# Patient Record
Sex: Male | Born: 1945 | Race: White | Hispanic: No | Marital: Married | Smoking: Never smoker
Health system: Southern US, Community
[De-identification: ages and names within clinical notes are randomized; demographics above are authoritative.]

## PROBLEM LIST (undated history)

## (undated) DIAGNOSIS — G500 Trigeminal neuralgia: Secondary | ICD-10-CM

## (undated) DIAGNOSIS — B029 Zoster without complications: Secondary | ICD-10-CM

## (undated) DIAGNOSIS — Z860101 Personal history of adenomatous and serrated colon polyps: Secondary | ICD-10-CM

## (undated) DIAGNOSIS — I639 Cerebral infarction, unspecified: Secondary | ICD-10-CM

## (undated) DIAGNOSIS — I10 Essential (primary) hypertension: Secondary | ICD-10-CM

## (undated) DIAGNOSIS — G7000 Myasthenia gravis without (acute) exacerbation: Secondary | ICD-10-CM

## (undated) DIAGNOSIS — E785 Hyperlipidemia, unspecified: Secondary | ICD-10-CM

## (undated) DIAGNOSIS — Z86010 Personal history of colonic polyps: Secondary | ICD-10-CM

## (undated) DIAGNOSIS — K2270 Barrett's esophagus without dysplasia: Secondary | ICD-10-CM

## (undated) DIAGNOSIS — K219 Gastro-esophageal reflux disease without esophagitis: Secondary | ICD-10-CM

## (undated) DIAGNOSIS — G4730 Sleep apnea, unspecified: Secondary | ICD-10-CM

## (undated) HISTORY — DX: Trigeminal neuralgia: G50.0

## (undated) HISTORY — DX: Barrett's esophagus without dysplasia: K22.70

## (undated) HISTORY — DX: Sleep apnea, unspecified: G47.30

## (undated) HISTORY — PX: COLONOSCOPY: SHX174

## (undated) HISTORY — DX: Zoster without complications: B02.9

## (undated) HISTORY — DX: Personal history of adenomatous and serrated colon polyps: Z86.0101

## (undated) HISTORY — DX: Gastro-esophageal reflux disease without esophagitis: K21.9

## (undated) HISTORY — DX: Essential (primary) hypertension: I10

## (undated) HISTORY — DX: Myasthenia gravis without (acute) exacerbation: G70.00

## (undated) HISTORY — DX: Hyperlipidemia, unspecified: E78.5

## (undated) HISTORY — DX: Personal history of colonic polyps: Z86.010

## (undated) HISTORY — PX: TONSILLECTOMY: SUR1361

---

## 2001-05-02 ENCOUNTER — Ambulatory Visit (HOSPITAL_COMMUNITY): Admission: RE | Admit: 2001-05-02 | Discharge: 2001-05-02 | Payer: Self-pay | Admitting: Internal Medicine

## 2001-05-10 ENCOUNTER — Encounter (INDEPENDENT_AMBULATORY_CARE_PROVIDER_SITE_OTHER): Payer: Self-pay | Admitting: *Deleted

## 2001-05-10 ENCOUNTER — Ambulatory Visit (HOSPITAL_BASED_OUTPATIENT_CLINIC_OR_DEPARTMENT_OTHER): Admission: RE | Admit: 2001-05-10 | Discharge: 2001-05-10 | Payer: Self-pay | Admitting: Orthopedic Surgery

## 2002-08-24 ENCOUNTER — Ambulatory Visit (HOSPITAL_BASED_OUTPATIENT_CLINIC_OR_DEPARTMENT_OTHER): Admission: RE | Admit: 2002-08-24 | Discharge: 2002-08-24 | Payer: Self-pay | Admitting: Family Medicine

## 2003-04-17 ENCOUNTER — Emergency Department (HOSPITAL_COMMUNITY): Admission: EM | Admit: 2003-04-17 | Discharge: 2003-04-17 | Payer: Self-pay | Admitting: *Deleted

## 2003-04-17 ENCOUNTER — Encounter: Payer: Self-pay | Admitting: *Deleted

## 2003-07-01 ENCOUNTER — Ambulatory Visit (HOSPITAL_COMMUNITY): Admission: RE | Admit: 2003-07-01 | Discharge: 2003-07-01 | Payer: Self-pay | Admitting: Internal Medicine

## 2003-07-01 HISTORY — PX: ESOPHAGOGASTRODUODENOSCOPY: SHX1529

## 2003-07-11 ENCOUNTER — Ambulatory Visit (HOSPITAL_COMMUNITY): Admission: RE | Admit: 2003-07-11 | Discharge: 2003-07-11 | Payer: Self-pay | Admitting: Family Medicine

## 2004-12-09 ENCOUNTER — Ambulatory Visit: Payer: Self-pay | Admitting: Internal Medicine

## 2004-12-09 ENCOUNTER — Ambulatory Visit (HOSPITAL_COMMUNITY): Admission: RE | Admit: 2004-12-09 | Discharge: 2004-12-09 | Payer: Self-pay | Admitting: Internal Medicine

## 2004-12-09 HISTORY — PX: ESOPHAGOGASTRODUODENOSCOPY: SHX1529

## 2004-12-09 HISTORY — PX: COLONOSCOPY: SHX174

## 2007-05-05 ENCOUNTER — Ambulatory Visit (HOSPITAL_COMMUNITY): Admission: RE | Admit: 2007-05-05 | Discharge: 2007-05-05 | Payer: Self-pay | Admitting: Neurology

## 2008-05-01 ENCOUNTER — Ambulatory Visit: Payer: Self-pay | Admitting: Internal Medicine

## 2008-05-20 ENCOUNTER — Ambulatory Visit (HOSPITAL_COMMUNITY): Admission: RE | Admit: 2008-05-20 | Discharge: 2008-05-20 | Payer: Self-pay | Admitting: Internal Medicine

## 2008-05-20 ENCOUNTER — Encounter: Payer: Self-pay | Admitting: Internal Medicine

## 2008-05-20 ENCOUNTER — Ambulatory Visit: Payer: Self-pay | Admitting: Internal Medicine

## 2008-05-20 HISTORY — PX: COLONOSCOPY: SHX174

## 2008-05-20 HISTORY — PX: ESOPHAGOGASTRODUODENOSCOPY: SHX1529

## 2008-09-04 ENCOUNTER — Ambulatory Visit (HOSPITAL_COMMUNITY): Admission: RE | Admit: 2008-09-04 | Discharge: 2008-09-04 | Payer: Self-pay | Admitting: Ophthalmology

## 2008-10-14 ENCOUNTER — Encounter: Payer: Self-pay | Admitting: Cardiology

## 2008-10-21 ENCOUNTER — Ambulatory Visit: Payer: Self-pay | Admitting: Cardiology

## 2008-10-21 ENCOUNTER — Encounter: Payer: Self-pay | Admitting: Physician Assistant

## 2008-10-22 ENCOUNTER — Ambulatory Visit: Payer: Self-pay | Admitting: Cardiology

## 2008-10-22 ENCOUNTER — Ambulatory Visit (HOSPITAL_COMMUNITY): Admission: RE | Admit: 2008-10-22 | Discharge: 2008-10-22 | Payer: Self-pay | Admitting: Cardiology

## 2008-10-22 ENCOUNTER — Encounter: Payer: Self-pay | Admitting: Cardiology

## 2008-11-15 ENCOUNTER — Ambulatory Visit: Payer: Self-pay | Admitting: Cardiology

## 2008-12-20 ENCOUNTER — Ambulatory Visit: Payer: Self-pay | Admitting: Cardiology

## 2008-12-25 ENCOUNTER — Ambulatory Visit: Payer: Self-pay | Admitting: Cardiology

## 2008-12-25 ENCOUNTER — Encounter: Payer: Self-pay | Admitting: Cardiology

## 2008-12-25 DIAGNOSIS — R002 Palpitations: Secondary | ICD-10-CM | POA: Insufficient documentation

## 2008-12-25 DIAGNOSIS — F411 Generalized anxiety disorder: Secondary | ICD-10-CM | POA: Insufficient documentation

## 2008-12-25 DIAGNOSIS — D126 Benign neoplasm of colon, unspecified: Secondary | ICD-10-CM | POA: Insufficient documentation

## 2008-12-25 DIAGNOSIS — I10 Essential (primary) hypertension: Secondary | ICD-10-CM | POA: Insufficient documentation

## 2008-12-25 DIAGNOSIS — G4733 Obstructive sleep apnea (adult) (pediatric): Secondary | ICD-10-CM | POA: Insufficient documentation

## 2008-12-25 DIAGNOSIS — R079 Chest pain, unspecified: Secondary | ICD-10-CM | POA: Insufficient documentation

## 2008-12-25 DIAGNOSIS — M109 Gout, unspecified: Secondary | ICD-10-CM | POA: Insufficient documentation

## 2008-12-25 DIAGNOSIS — K219 Gastro-esophageal reflux disease without esophagitis: Secondary | ICD-10-CM | POA: Insufficient documentation

## 2008-12-25 DIAGNOSIS — K2270 Barrett's esophagus without dysplasia: Secondary | ICD-10-CM | POA: Insufficient documentation

## 2008-12-25 DIAGNOSIS — E782 Mixed hyperlipidemia: Secondary | ICD-10-CM | POA: Insufficient documentation

## 2009-02-04 ENCOUNTER — Telehealth (INDEPENDENT_AMBULATORY_CARE_PROVIDER_SITE_OTHER): Payer: Self-pay | Admitting: *Deleted

## 2010-08-09 ENCOUNTER — Encounter: Payer: Self-pay | Admitting: Ophthalmology

## 2010-12-01 NOTE — H&P (Signed)
NAMEKIPTON, SKILLEN              ACCOUNT NO.:  [id number]   MEDICAL RECORD NO.:  [id number]          PATIENT TYPE:  AMB   LOCATION:  DAY                           FACILITY:  [facility]   PHYSICIAN:  R. Roetta Sessions, M.D. DATE OF BIRTH:  1946-05-24   DATE OF ADMISSION:  DATE OF DISCHARGE:  LH                              HISTORY & PHYSICAL   REFERRING PHYSICIAN:  Donna Bernard, MD   REASON FOR CONSULTATION:  Due for surveillance of Barrett and  surveillance of adenomatous colon polyps.   HISTORY OF PRESENT ILLNESS:  Mr. Mosqueda is a 65 year old Caucasian male.  He has a history of Barrett esophagus.  Last EGD was Dec 10, 2004.  He  was found to have salmon-colored epithelium beginning at 33 cm  consistent with previous diagnosis of Barrett's esophagus which was  segmentally biopsied and consistent with benign glandular epithelium  with an intestinal metaplasia which was consistent with 2004 exam.  He  does have a hiatal hernia.  He underwent colonoscopy at the same time.  He had a diminutive polyp removed at 35 cm which was found an inflamed  focally adenomatous polyp.  He is here for surveillance.  He denies any  GI problems at this time.  He does have chronic GERD which is well  controlled on Nexium 40 mg daily.  There is a positive family history of  colon cancer in his mother in her late 54s.  He denies any abdominal  pain.  Denies rectal bleeding or melena.  Denies any constipation or  diarrhea.  Denies any heartburn, indigestion, dysphagia, odynophagia,  anorexia, or early satiety.   PAST MEDICAL AND SURGICAL HISTORY:  1. History of Barrett esophagus.  As described above, history of      chronic GERD.  2. History of adenomatous colon polyp as described in HPI.  3. Hypertension.  4. Hypercholesterolemia.  5. GERD.  6. Gout.  7. Bilateral great toe surgeries.   CURRENT MEDICATIONS:  1. Nexium 40 mg daily.  2. Lisinopril 5 mg daily.  3. Pravastatin 80 mg daily.  4.  Allopurinol 100 mg daily.  5. Aspirin 81 mg daily.   ALLERGIES:  No known drug allergies.   FAMILY HISTORY:  Positive as described in the HPI in mother.  Father  deceased secondary to MI.  Sister is healthy and 4 brothers with history  significant for prostate carcinoma.   SOCIAL HISTORY:  Mr. Hoogendoorn is divorced.  He lives alone.  He has 2  children, a son and a daughter.  He is retired from [employer].  He denies any tobacco or drug use.  He consumes  about 4 beers per month.   REVIEW OF SYSTEMS:  See HPI, otherwise negative physical.   PHYSICAL EXAMINATION:  VITAL SIGNS:  Weight 221 pounds, 65 inches, temp  98 degrees, blood pressure 132/84, and pulse 54.  GENERAL:  He is an obese Caucasian male who is alert, oriented,  pleasant, cooperative in no acute distress.  HEENT:  Sclerae clear, nonicteric. Conjunctivae pink. Oropharynx pink  and moist without any lesions.  NECK:  Supple without mass or thyromegaly.  CHEST:  Heart regular rate and rhythm.  Normal S1 and S2.  No murmurs,  clicks, rubs, or gallops.  LUNGS:  Clear to auscultation bilaterally.  ABDOMEN:  Positive bowel sounds x4.  No bruits auscultated.  Soft,  nontender, nondistended without palpable mass or hepatosplenomegaly.  No  rigidity or guarding.  EXTREMITIES:  Without clubbing or edema.   IMPRESSION:  Mr. Catterton is a 65 year old Caucasian male who is due for  surveillance of both his Barrett esophagus as well as his history of  adenomatous colon polyp.   PLAN:  1. Surveillance EGD with random biopsies and colonoscopy with Dr.      Jena Gauss in the near future.  I discussed both procedures including      risks and benefits, which include but are not limited to bleeding,      infection, perforation, drug reaction.  He agrees with this plan      and consent will be obtained.  2. He is to continue Nexium 40 mg daily.      Lorenza Burton, N.P.      Jonathon Bellows, M.D.   Electronically Signed    KJ/MEDQ  D:  05/01/2008  T:  05/02/2008  Job:  [id number]

## 2010-12-01 NOTE — Op Note (Signed)
Bradley [name], Bradley [name]              ACCOUNT NO.:  [id number]   MEDICAL RECORD NO.:  [id number]          PATIENT TYPE:  AMB   LOCATION:  DAY                           FACILITY:  [facility]   PHYSICIAN:  R. Roetta Sessions, M.D. DATE OF BIRTH:  [birth year]   DATE OF PROCEDURE:  DATE OF DISCHARGE:  05/20/2008                               OPERATIVE REPORT   PROCEDURE:  Esophagogastroduodenoscopy with esophageal biopsy followed  by colonoscopy snare polypectomy.   INDICATIONS FOR PROCEDURE:  A 65 year old gentleman with known Barrett's  esophagus, family history of colonic adenomatous polyps.  EGD and  colonoscopy is now being done.  Risks, benefits, alternatives, and  limitations have been reviewed previously and again today at bedside.  Questions answered.  Please see documentation in medical record.   PROCEDURE NOTE:  O2 saturation, blood pressure, pulse, and respirations  monitored throughout the entire of the above procedure.  Conscious  sedation, Versed 5 mg IV and Demerol 75 mg IV in divided doses.   INSTRUMENT:  Pentax video chip system.   FINDINGS:  Examination of tubular esophagus revealed salmon-colored  epithelium coming up to approximately 31 cm from the incisors.  EG  junction was turned the approximate 37 cm from the incisors.  There was  no evidence of neoplasm or esophagitis.  EG junction easily traversed.  Stomach:  Gas cavity was emptied and insufflated well with air.  Thorough examination of the gastric mucosa including retroflexion at the  proximal stomach and esophagogastric junction demonstrated only a small  hiatal hernia.  Pylorus was patent and easily traversed.  Examination of  the bulb and second portion revealed no abnormalities.  Therapeutic/diagnostic maneuvers performed:  The biopsies of proximal  mid distal Barrett's taken for histologic study.  The patient tolerated  the procedure well and was prepared for colonoscopy.  Digital rectal  exam revealed no  abnormalities, and the scope was placed.  The prep was  adequate.  Colon:  Colonic mucosa was surveyed from the rectosigmoid  junction through the left transverse, right colon to the appendiceal  orifice, ileocecal valve, and cecum.  These structures were well seen  and photographed for the record.  From this level, the scope was slowly  withdrawn.  All previously mentioned mucosal surfaces were again seen.  The patient had a 6-mm polyp on a stalk mid sigmoid which was cold  snared.  The remainder of the colonic mucosa appeared normal.  The scope  was pulled down to the rectum with examination of rectal mucosa,  including retroflexed view of the anal verge demonstrated no  abnormalities.  The patient tolerated the procedure well and was reacted  to endoscopy.   IMPRESSION:  1. EGD, salmon-colored epithelium distal esophagus consistent with      Barrett's esophagus.  No significant change from prior exam, status      post segmental biopsy, hiatal hernia, otherwise normal stomach, D1      and D2.  2. Colonoscopy findings, normal rectum.  3. Polyp midsigmoid status post snare removal.  Remainder of the      colonic mucosa appeared normal.  RECOMMENDATIONS:  1. We will follow path.  2. Continue Nexium 40 mg orally daily indefinitely.  3. Further recommendations to follow.   Addendum 08/30/2008:  Patient has a personal history of colonic adenomatous polyps and need  for surveillance colonoscopy was indicated.      Jonathon Bellows, M.D.  Electronically Signed     RMR/MEDQ  D:  05/20/2008  T:  08/30/2008  Job:  [id number]

## 2010-12-01 NOTE — Letter (Signed)
November 15, 2008    Bradley A. Gerda Diss, MD  [address]  [place], [place] [address]   RE:  Bradley [name], Bradley [name]  MRN:  [id number]  /  DOB:  Sep 11, 1945   Dear Bradley [name]:   Bradley [name] returns to the office for continued assessment and treatment  of chest discomfort.  He continues to have some chest pressure  intermittently that is unrelated with exertion and resolve  spontaneously.  He also has some sharp chest pain that is momentary.  Overall, he feels that this symptom is substantially better.   Currently, he is also complaining of palpitations.  His wife is  concerned that he has a supraventricular tachycardia.  At times, heart  rate  is up to 120 recorded by his blood pressure device.  He typically  appears fatigued during these spells.  At other times, his heart rate  drops to 70 with an improved sense of well being.   Medications are unchanged from his last visit.   On exam, pleasant gentleman in no acute distress.  The weight is 219, stable.  Blood pressure 125/75, heart rate 105 and  regular, respirations 12 and unlabored.  NECK:  No jugular venous distention; no carotid bruits.  LUNGS:  Clear.  CARDIAC:  Normal first and second heart sounds; fourth heart sound  present.  ABDOMEN:  Soft and nontender; no bruits; no masses; no organomegaly.  EXTREMITIES:  No edema; normal distal pulses.   IMPRESSION:  Bradley [name] had a negative and adequate stress test.  His  symptoms are atypical.  I doubt that he has coronary artery disease or  coronary artery spasm.   His subjective and objective tachycardias and episodic chest pressure  are certainly consistent with anxiety.  He agrees that he worries a lot  and has considerable stress in his life.  He has improved with use of  benzodiazepines intermittently in the past.  This might be an avenue to  consider as well as perhaps some psychotherapy.   For now, we will proceed with event recording to document that he does  not have  tachycardia.  I explained that SVT is in the range of 110 - 120  beats per minute are unusual, but certainly not unheard of.  I will see  this nice gentleman in back in 1 month after his 3 weeks of event  recording has been completed.    Sincerely,      Gerrit Friends. Dietrich Pates, MD, [facility]  Electronically Signed    RMR/MedQ  DD: 11/15/2008  DT: 11/16/2008  Job #: [id number]

## 2010-12-01 NOTE — Assessment & Plan Note (Signed)
[facility]                       [place] CARDIOLOGY OFFICE NOTE   NAME:[name] [name] GEFFRE                     MRN:          [id number]  DATE:10/21/2008                            DOB:          Nov 20, 1945    REFERRING PHYSICIAN:  Scott A. Gerda Diss, MD   REASON FOR REFERRAL:  Chest pain.   HISTORY OF PRESENT ILLNESS:  [name] [name] is a 65 year old male patient  with a history of hypertension, hyperlipidemia, and family history of  CAD who was referred by Dr. Gerda Diss for further evaluation of chest  discomfort.  The patient notes a history of exertional shortness of  breath and chest discomfort that he describes as a tightness/pressure  over the last 6 months that seems to be getting worse.  He also notes  some right arm pain associated with that.  He denies nausea,  diaphoresis, syncope or near-syncope.  He has also had some symptoms of  chest tightness at rest.  He says that this is clearly different from  the symptoms he experiences with exertion.  This can usually last for a  couple of minutes up to all day.  He also notes a history of chest  discomfort that occurred about 2-3 weeks ago when he laid down to go to  bed.  It was a sharp type pain that was substernal and felt like it  radiated through to his back.  He felt that his heart was racing with  this.  He was slightly short of breath.  He denied any associated  symptoms such as arm or jaw pain, nausea, diaphoresis.  His.  He says he  thought his heart rate was in the 120s.   PAST MEDICAL HISTORY:  As outlined above.  In addition, he had a stress  test some years ago that was apparently negative.  The records are  unavailable at this time.  He has a history of gout, as well as GERD and  sleep apnea.  He cannot tolerate CPAP.  He he does have a history of  Barrett's esophagus, as well as an adenomatous colon polyp and has been  evaluated by Dr. Jena Gauss in the past.   MEDICATIONS:  Nexium 40 mg  daily, pravastatin 80 mg daily, allopurinol  300 mg daily, lisinopril 5 mg daily, aspirin 325 mg p.r.n.   ALLERGIES:  NO KNOWN DRUG ALLERGIES.   SOCIAL HISTORY:  The patient lives in the [place].  He is a retired  Consulting civil engineer for the [employer]  He is  unmarried.  He has two children.  He does have a girlfriend.  He plays  in a band and is exposed to quite a bit of secondhand smoke.  He denies  any tobacco abuse himself and denies any alcohol abuse.   FAMILY HISTORY:  Significant for CAD.  His father died at age 85 of a  myocardial infarction.  His first MI was in his 19s.  He has a brother  who had a myocardial infarction in his 88s as well who is alive at this  time at age 71.   REVIEW OF SYSTEMS:  Please see HPI.  Denies fevers, chills, cough,  congestion, sore throat, melena, hematochezia, hematuria or dysuria.  All other systems reviewed and negative.   PHYSICAL EXAMINATION:  He is a well-nourished, well-developed male in no  acute distress.  Blood pressure is 122/78, pulse 88, weight 220 pounds.  HEENT:  Normal.  NECK:  Without JVD.  LYMPH:  No lymphadenopathy.  ENDOCRINE:  Without thyromegaly.  CARDIAC:  Normal S1, S2.  Regular rate and rhythm without murmur,  clicks, rubs or gallops.  LUNGS:  Clear to auscultation bilaterally without wheezing, rhonchi or  rales.  ABDOMEN:  Soft and tender with normoactive bowel sounds.  No  organomegaly.  EXTREMITIES:  Without edema.  SKIN:  Warm and dry.  NEUROLOGIC:  He is alert and oriented x3.  Cranial nerves II-XII are  grossly intact.  VASCULAR:  No carotid bruits noted bilaterally.  Femoral artery pulses  are difficult to palpate bilaterally but no bruits are auscultated.  Dorsalis pedis and posterior tibialis pulses are 2+ bilaterally.  Popliteal pulse are 2+ bilaterally.   Electrocardiogram demonstrates sinus rhythm with a heart rate of 88,  normal axis, poor R-wave progression, no ischemic  changes.   ASSESSMENT/PLAN:  [name] [name] is 65 year old male with hypertension,  treated hyperlipidemia and a family history of coronary artery disease  who presents for further evaluation of exertional chest pain and  shortness of breath.  Dr. Dietrich Pates has also interviewed and the patient.  After further review with Dr. Dietrich Pates, it is felt that the patient's  symptoms have some atypical features to them as well.  We plan to  further evaluate him with a routine exercise treadmill test tomorrow  with Dr. Dietrich Pates.  He will also be set up for a 2-D echocardiogram to  assess for wall motion abnormalities and to further assess his LV  function given his abnormal electrocardiogram.  He will be asked to take  an aspirin a day.  Nitroglycerin to be taken p.r.n. will be added to his  medical regimen.  We will see him back in follow-up tomorrow for an  office visit after his stress test is completed.      Tereso Newcomer, PA-C  Electronically Signed      Gerrit Friends. Dietrich Pates, MD, [facility]  Electronically Signed   SW/MedQ  DD: 10/21/2008  DT: 10/21/2008  Job #: [id number]   cc:   Lorin Picket A. Gerda Diss, MD

## 2010-12-01 NOTE — Op Note (Signed)
Bradley [name], Bradley [name]              ACCOUNT NO.:  [id number]   MEDICAL RECORD NO.:  [id number]          PATIENT TYPE:  AMB   LOCATION:  DAY                           FACILITY:  [facility]   PHYSICIAN:  R. Roetta Sessions, M.D. DATE OF BIRTH:  09-03-1945   DATE OF PROCEDURE:  DATE OF DISCHARGE:                               OPERATIVE REPORT   PROCEDURE:  Esophagogastroduodenoscopy with esophageal biopsy followed  by colonoscopy snare polypectomy.   INDICATIONS FOR PROCEDURE:  A 65 year old gentleman with known Barrett's  esophagus, family history of colonic adenomatous polyps.  EGD and  colonoscopy is now being done.  Risks, benefits, alternatives, and  limitations have been reviewed previously and again today at bedside.  Questions answered.  Please see documentation in medical record.   PROCEDURE NOTE:  O2 saturation, blood pressure, pulse, and respirations  monitored throughout the entire of the above procedure.  Conscious  sedation, Versed 5 mg IV and Demerol 75 mg IV in divided doses.   INSTRUMENT:  Pentax video chip system.   FINDINGS:  Examination of tubular esophagus revealed salmon-colored  epithelium coming up to approximately 31 cm from the incisors.  EG  junction was turned the approximate 37 cm from the incisors.  There was  no evidence of neoplasm or esophagitis.  EG junction easily traversed.  Stomach:  Gas cavity was emptied and insufflated well with air.  Thorough examination of the gastric mucosa including retroflexion at the  proximal stomach and esophagogastric junction demonstrated only a small  hiatal hernia.  Pylorus was patent and easily traversed.  Examination of  the bulb and second portion revealed no abnormalities.  Therapeutic/diagnostic maneuvers performed:  The biopsies of proximal  mid distal Barrett's taken for histologic study.  The patient tolerated  the procedure well and was prepared for colonoscopy.  Digital rectal  exam revealed no abnormalities, and  the scope was placed.  The prep was  adequate.  Colon:  Colonic mucosa was surveyed from the rectosigmoid  junction through the left transverse, right colon to the appendiceal  orifice, ileocecal valve, and cecum.  These structures were well seen  and photographed for the record.  From this level, the scope was slowly  withdrawn.  All previously mentioned mucosal surfaces were again seen.  The patient had a 6-mm polyp on a stalk mid sigmoid which was cold  snared.  The remainder of the colonic mucosa appeared normal.  The scope  was pulled down to the rectum with examination of rectal mucosa,  including retroflexed view of the anal verge demonstrated no  abnormalities.  The patient tolerated the procedure well and was reacted  to endoscopy.   IMPRESSION:  1. EGD, salmon-colored epithelium distal esophagus consistent with      Barrett's esophagus.  No significant change from prior exam, status      post segmental biopsy, hiatal hernia, otherwise normal stomach, D1      and D2.  2. Colonoscopy findings, normal rectum.  3. Polyp midsigmoid status post snare removal.  Remainder of the      colonic mucosa appeared normal.   RECOMMENDATIONS:  1. We will follow path.  2. Continue Nexium 40 mg orally daily indefinitely.  3. Further recommendations to follow.      Jonathon Bellows, M.D.  Electronically Signed     RMR/MEDQ  D:  05/20/2008  T:  05/20/2008  Job:  [id number]   cc:   Lorin Picket A. Gerda Diss, MD  Fax: [phone]

## 2010-12-01 NOTE — Assessment & Plan Note (Signed)
[facility]                       [place] CARDIOLOGY OFFICE NOTE   NAME:Bradley [name], Bradley [name]                     MRN:          [id number]  DATE:10/22/2008                            DOB:          1945/09/26    PRIMARY CARE PHYSICIAN:  Scott A. Gerda Diss, MD   REASON FOR VISIT:  Post stress test followup.   HISTORY OF PRESENT ILLNESS:  Bradley [name] is a 65 year old male patient  whom I saw yesterday in consultation for chest pain.  I saw him in  conjunction with Dr. Dietrich Pates.  We set him up for exercise treadmill  test today to further evaluate his symptoms.  He was also set up for an  echocardiogram.  The stress test was read by Dr. Dietrich Pates.  I was  informed that his exercise treadmill test was normal without chest pain  or EKG changes.  Dr. Dietrich Pates also informed me that the patient's  echocardiogram was normal.  The patient was interviewed again today  regarding his chest pain.  He notes that he has not really had any  further symptoms in the last 4-5 days.  The most concerning symptom for  him was a couple of weeks ago when he laid down and had discomfort  substernally that radiated through to his back.  He notes that this is  not like his prior symptoms with acid reflux disease.  He notes that he  is able to exert himself at times without significant chest pain or  shortness of breath.  He denies any injuries to his chest.  He denies  any pleuritic symptoms.  He denies any syncope.  He denies any cough.   CURRENT MEDICATIONS:  Unchanged from yesterday.  He is now taking an  aspirin a day and has been given nitroglycerin p.r.n.   PHYSICAL EXAMINATION:  VITAL SIGNS:  Well-nourished well-developed male  in no acute distress.  Blood pressure at the beginning of the stress  test was 122/78, pulse 74.  HEENT:  Normal.  CARDIAC:  Normal S1 and S2.  Regular rate and rhythm.  LUNGS:  Clear to auscultation bilaterally.  ABDOMEN:  Soft, nontender.  EXTREMITIES:  Without edema.  SKIN:  Warm and dry.   ASSESSMENT AND PLAN:  1. Chest pain.  As noted above, the patient had a recent      echocardiogram as well as gated exercise treadmill test that are      both normal.  I discussed his case further with Dr. Dietrich Pates.      After further review, we have reassured the patient today regarding      his negative findings.  I offered to increase his Nexium to 40 mg      twice a day to see if this provided any benefit.  The patient does      not think his symptoms are related to acid reflux disease and would      like to hold off on this for now.  He has been instructed on how to      use his nitroglycerin.  If he has to use his nitroglycerin, he has  been instructed to contact us for earlier followup.  He has been      instructed on when he should go the emergency room.  He will      continue taking aspirin for now.  2. Hypertension, this is well controlled.  3. Dyslipidemia.  This is followed by Dr. Gerda Diss.  He continues on      pravastatin.   DISPOSITION:  The patient will be brought back in followup in 2 weeks to  see Dr. Dietrich Pates to reassess his symptoms and make further  recommendations, if any.      Tereso Newcomer, PA-C  Electronically Signed      Gerrit Friends. Dietrich Pates, MD, [facility]  Electronically Signed   SW/MedQ  DD: 10/22/2008  DT: 10/23/2008  Job #: [id number]   cc:   Lorin Picket A. Gerda Diss, MD

## 2010-12-04 NOTE — Op Note (Signed)
NAMEJACHAI, Bradley [name]                        ACCOUNT NO.:  [id number]   MEDICAL RECORD NO.:  [id number]                   PATIENT TYPE:  AMB   LOCATION:  DAY                                  FACILITY:  [facility]   PHYSICIAN:  R. Roetta Sessions, M.D.              DATE OF BIRTH:  11-21-1945   DATE OF PROCEDURE:  07/01/2003  DATE OF DISCHARGE:                                 OPERATIVE REPORT   PROCEDURE:  Esophagogastroduodenoscopy with biopsy.   INDICATIONS FOR PROCEDURE:  The patient is a 65 year old gentleman with long-  standing gastroesophageal reflux disease and biopsy proven Barrett's  esophagus.  Last surveillance EGD, June 2000.  He has done well with no  dysphagia or other GI symptoms.  He also had a colonoscopy for high risk  screening purposes at that time in 2000 which was negative and he is due for  surveillance colonoscopy in 2005.  He is here for surveillance EGD.  This  approach has been discussed with the patient at length.  The potential  risks, benefits, and alternatives have been reviewed and questions answered,  he is agreeable.  Please see my interim H&P for more information.   PROCEDURE NOTE:  O2 saturation, blood pressure, pulse, and respirations were  monitored throughout the entire procedure.   CONSCIOUS SEDATION:  Versed 3 mg IV and Demerol 75 mg IV in divided doses.   INSTRUMENT:  Olympus video adult gastroscope.   FINDINGS:  Examination of tubular esophagus which revealed color changes,  salmon colored epithelium beginning at 32 cm and extending completely down  to the EG junction.  There was no evidence of neoplasm or esophagitis -  please see photos.  The EG junction was patulous and easily traversed.   Stomach:  The gastric cavity was empty and insufflated well with air and a  thorough examination of the gastric mucosa, including a retroflex view of  the proximal stomach and esophagogastric junction demonstrated only a  moderate sized hiatal hernia.   The pylorus was patent and easily traversed.   Duodenum:  The duodenum and duodenal bulb, including the second portion  appeared normal.   THERAPEUTIC/DIAGNOSTIC MANEUVERS PERFORMED:  Biopsies at 40 cm (distal  esophagus), 35 cm and 32 cm from the incisors taken for histologic study.   The patient tolerated the procedure well and was reactive after endoscopy.   IMPRESSION:  1. Salmon colored epithelium involving essentially nearly the distal one-     half of the tubular esophagus, segmental biopsies taken.  Remainder of     esophageal mucosa appeared normal.  2. Patulous esophagogastric junction.  3. Moderate sized hiatal hernia.  4. Otherwise normal stomach, normal D1 and D2.   RECOMMENDATIONS:  1. Continue Nexium 40 mg orally daily.  2. Follow up on pathology.  3. The patient should return in 2005 for high risk screening colonoscopy.      ___________________________________________  Bradley [name], M.D.   RMR/MEDQ  D:  07/01/2003  T:  07/01/2003  Job:  [id number]   cc:   Lorin Picket A. Gerda Diss, M.D.  [address]  [place]  [place] [address]  Fax: [phone]

## 2010-12-04 NOTE — Op Note (Signed)
[facility]  Patient:    Bradley [name], Bradley [name] Visit Number: [id number] MRN: [id number]          Service Type: DSU Location: [facility] Attending Physician:  Ronne Binning Dictated by:   Nicki Reaper, M.D. Proc. Date: 05/10/01 Admit Date:  05/10/2001                             Operative Report  PREOPERATIVE DIAGNOSIS: Foreign body, left little finger.  POSTOPERATIVE DIAGNOSIS: Foreign body, left little finger.  OPERATION/PROCEDURE: Excision of foreign body of left little finger extensor tendon.  SURGEON: Nicki Reaper, M.D.  ASSISTANT: Joaquin Courts, R.N.  ANESTHESIA: Forearm-based IV regional.  ANESTHESIOLOGIST: Guadalupe Maple, M.D.  INDICATIONS FOR PROCEDURE: The patient is a 65 year old male, who suffered an injury with a piece of wood to his left little finger, with an entrance wound through his nail.  He has had a mass form in the dorsal aspect and deformity of the nail bed, pain with motion to the distal interphalangeal joint.  DESCRIPTION OF PROCEDURE: The patient was brought to the operating room, where a forearm-based IV regional anesthetic was carried out without difficulty.  He was prepped and draped using Betadine scrubbing solution with the left arm free.  The nail plate was removed.  The area of deformity was immediately apparent.  There was some change visible.  It was decided to proceed with an incision proximally.  Incision was made, curvilinear in nature, over the distal interphalangeal joint and carried down through subcutaneous tissue.  A cystic area was debrided along with an area of hyperkeratosis in the nail fold where the deformity to the nail was.  The wound was then extended proximally. Entrance into the joint was identified and several small fragments of wood were removed from the extensor tendon.  The extensor tendon was partially split to be certain no further foreign material was present, and no further foreign  material was obvious.  The wound was copiously irrigated with saline. The joint was opened and no foreign material was present within the joint. The nail plate was reapproximated to the proximal nail fold.  A sterile compressive dressing and splint were applied after closure with interrupted 5-0 nylon suture.  The patient tolerated the procedure well and was taken to the recovery room for observation in satisfactory condition.  He is discharged home to return to the [facility] [place] in one week, on Vicodin and Keflex. Dictated by:   Nicki Reaper, M.D. Attending Physician:  Ronne Binning DD:  05/10/01 TD:  05/11/01 Job: [id number]

## 2010-12-04 NOTE — Op Note (Signed)
NAMEAVIN, GIBBONS              ACCOUNT NO.:  [id number]   MEDICAL RECORD NO.:  [id number]          PATIENT TYPE:  AMB   LOCATION:  DAY                           FACILITY:  [facility]   PHYSICIAN:  R. Roetta Sessions, M.D. DATE OF BIRTH:  1945/07/24   DATE OF PROCEDURE:  12/09/2004  DATE OF DISCHARGE:                                 OPERATIVE REPORT   PROCEDURE:  Esophagogastroduodenoscopy with biopsy followed by colonoscopy  with biopsy.   INDICATIONS FOR PROCEDURE:  The patient is a 65 year old gentleman with  known Barrett's esophagus. It was biopsied on EGD back in 2004, confirmed as  intestinal metaplasia but no dysplasia. Positive family history of colon  cancer in his mother. Screening colonoscopy was negative in 2000. He is  really devoid of any GI tract symptoms now. He is here for surveillance.  Both EGD and colonoscopy are now being done, EGD for surveillance,  colonoscopy for screening. This approach has been discussed with the patient  at length. Potential risks, benefits, and alternatives have been reviewed  and questions answered. He is agreeable. Please see documentation in the  medical record.   PROCEDURE NOTE:  O2 saturation, blood pressure, pulse, and respirations were  monitored throughout the entire procedure. Conscious sedation with Versed 4  mg IV and Demerol 100 mg IV in divided doses.   INSTRUMENT:  Olympus video chip system.   FINDINGS ON ESOPHAGOGASTRODUODENOSCOPY:  Examination of the tubular  esophagus revealed salmon colored epithelium beginning at 33 cm from the  incisors, extending down completely to the EG junction at 41 cm. Please see  photos. No evidence of esophagitis or tumor. EG junction easily traversed.   Stomach:  Gastric cavity was empty and insufflated well with air. Thorough  examination of gastric mucosa including retroflexed view of the proximal  stomach and esophagogastric junction demonstrated only hiatal hernia.  Pylorus patent and  easily traversed. Examination of bulb and section portion  revealed no abnormalities.   THERAPEUTIC/DIAGNOSTIC MANEUVERS:  Segmental biopsies of the Barrett's  esophagus at 33, 36, 39, 41 were taken for histologic study. The patient  tolerated the procedure well and was prepared for colonoscopy. Digital  rectal exam revealed no abnormalities.   ENDOSCOPIC FINDINGS:  Prep was adequate.   Rectum:  Examination of the rectal mucosa including retroflexed view of the  anal verge revealed no abnormalities.   Colon:  Colonic mucosa was surveyed from the rectosigmoid junction through  the left, transverse, and right colon to the area of the appendiceal  orifice, ileocecal valve, and cecum. These structures were well seen and  photographed for the record. Olympus videoscope was slowly withdrawn. All  previously mentioned mucosa surfaces were again seen. The patient had a 4-mm  polyp at 25 cm. It was cold biopsied/removed. The remainder of colonic  mucosa appeared normal. The patient tolerated the procedure well and was  reactive to endoscopy.   IMPRESSION:  ESOPHAGOGASTRODUODENOSCOPY:  Salmon-colored epithelium  beginning at 33 cm consistent with previous diagnosis of Barrett's esophagus  segmentally biopsied. Hiatal hernia. Otherwise normal stomach. Normal D1 and  D2.   COLONOSCOPY:  1.  Normal rectum.  2.  Diminutive polyp at 35 cm biopsied/removed. The remainder of colonic      mucosa appeared normal.   RECOMMENDATIONS:  1.  Follow on pathology.  2.  Further recommendations to follow.      RMR/MEDQ  D:  12/09/2004  T:  12/09/2004  Job:  [id number]   cc:   Lorin Picket A. Gerda Diss, MD  [address]  [place]  [place] [address]  Fax: [phone]

## 2011-04-15 ENCOUNTER — Encounter: Payer: Self-pay | Admitting: Internal Medicine

## 2011-04-28 LAB — POCT I-STAT CREATININE
Creatinine, Ser: 1.4
Operator id: 217071

## 2011-06-30 ENCOUNTER — Encounter: Payer: Self-pay | Admitting: Cardiology

## 2012-07-21 ENCOUNTER — Emergency Department (HOSPITAL_COMMUNITY)
Admission: EM | Admit: 2012-07-21 | Discharge: 2012-07-21 | Disposition: A | Discharge status: Home / Self Care | Payer: BC Managed Care – PPO | Attending: Emergency Medicine | Admitting: Emergency Medicine

## 2012-07-21 ENCOUNTER — Encounter (HOSPITAL_COMMUNITY): Payer: Self-pay | Admitting: *Deleted

## 2012-07-21 DIAGNOSIS — K219 Gastro-esophageal reflux disease without esophagitis: Secondary | ICD-10-CM | POA: Insufficient documentation

## 2012-07-21 DIAGNOSIS — Z8639 Personal history of other endocrine, nutritional and metabolic disease: Secondary | ICD-10-CM | POA: Insufficient documentation

## 2012-07-21 DIAGNOSIS — Z862 Personal history of diseases of the blood and blood-forming organs and certain disorders involving the immune mechanism: Secondary | ICD-10-CM | POA: Insufficient documentation

## 2012-07-21 DIAGNOSIS — Z8669 Personal history of other diseases of the nervous system and sense organs: Secondary | ICD-10-CM | POA: Insufficient documentation

## 2012-07-21 DIAGNOSIS — L0291 Cutaneous abscess, unspecified: Secondary | ICD-10-CM

## 2012-07-21 DIAGNOSIS — I10 Essential (primary) hypertension: Secondary | ICD-10-CM | POA: Insufficient documentation

## 2012-07-21 DIAGNOSIS — Z860100 Personal history of colon polyps, unspecified: Secondary | ICD-10-CM | POA: Insufficient documentation

## 2012-07-21 DIAGNOSIS — E785 Hyperlipidemia, unspecified: Secondary | ICD-10-CM | POA: Insufficient documentation

## 2012-07-21 DIAGNOSIS — H60399 Other infective otitis externa, unspecified ear: Secondary | ICD-10-CM | POA: Insufficient documentation

## 2012-07-21 DIAGNOSIS — Z8709 Personal history of other diseases of the respiratory system: Secondary | ICD-10-CM | POA: Insufficient documentation

## 2012-07-21 MED ORDER — LIDOCAINE HCL (PF) 1 % IJ SOLN
INTRAMUSCULAR | Status: AC
  Filled 2012-07-21: qty 5

## 2012-07-21 MED ORDER — SULFAMETHOXAZOLE-TRIMETHOPRIM 800-160 MG PO TABS: 1.0000 | ORAL_TABLET | Freq: Two times a day (BID) | ORAL | Status: DC

## 2012-07-21 MED ORDER — HYDROCODONE-ACETAMINOPHEN 5-325 MG PO TABS: 2.0000 | ORAL_TABLET | ORAL | Status: DC | PRN

## 2012-07-21 MED ORDER — LIDOCAINE HCL (PF) 1 % IJ SOLN: 30.0000 mL | Freq: Once | INTRAMUSCULAR | Status: DC

## 2012-07-21 NOTE — ED Notes (Signed)
Covered area with telfa dressing.

## 2012-07-21 NOTE — ED Notes (Signed)
Pt has had swelling in front of lt ear with redness and heat x1 wk, PCP placed pt on Keflex 500, per pt the place has no improved. Pt also currently treated for UTI. PCP sent pt to ER for further treatment to infected area.

## 2012-07-21 NOTE — ED Notes (Signed)
Pt has sore to left cheek x 3 days.  Saw pcp and was started on keflex.  Reports is not getting any better.  Area red and puffy.

## 2012-07-21 NOTE — ED Provider Notes (Signed)
History     CSN: [id number]  Arrival date & time 07/21/12  1641   First MD Initiated Contact with Patient 07/21/12 1703      Chief Complaint  Patient presents with  . Abscess    swelling by lt ear    (Consider location/radiation/quality/duration/timing/severity/associated sxs/prior treatment) HPI Comments: This presents to the ER for evaluation of an abscess on his left face and ear area. Patient reports that the area started a week ago. His primary care physician put him on Keflex which he has been taking for 3 days without improvement. She reports that the area is becoming more painful and swollen over the last several days. His PCP sent him to the ER.  Patient is a 67 y.o. male presenting with abscess.  Abscess  Pertinent negatives include no fever.    Past Medical History  Diagnosis Date  . Hypertension   . Hyperlipidemia   . GERD (gastroesophageal reflux disease)   . Barrett esophagus     History of  . Gout   . Sleep apnea     Noncompliant with CPAP  . History of adenomatous polyp of colon     Past Surgical History  Procedure Date  . Colonoscopy     Family History  Problem Relation Age of Onset  . Heart attack Father 27    First MI in 37s  . Heart attack Brother 50    History  Substance Use Topics  . Smoking status: Unknown If Ever Smoked  . Smokeless tobacco: Not on file     Comment: Increased exposure to second hand smoke  . Alcohol Use: No      Review of Systems  Constitutional: Negative for fever.  Skin:       Tender, redness swelling    Allergies  Review of patient's allergies indicates no known allergies.  Home Medications   Current Outpatient Rx  Name  Route  Sig  Dispense  Refill  . ALLOPURINOL 300 MG PO TABS   Oral   Take 300 mg by mouth daily.           Marland Kitchen ALPRAZOLAM 1 MG PO TABS   Oral   Take 1 mg by mouth as needed.           . ASPIRIN 325 MG PO TABS   Oral   Take 325 mg by mouth as needed.           Marland Kitchen  ESOMEPRAZOLE MAGNESIUM 40 MG PO CPDR   Oral   Take 40 mg by mouth daily before breakfast.           . LISINOPRIL 5 MG PO TABS   Oral   Take 5 mg by mouth daily.           Marland Kitchen NITROGLYCERIN 0.4 MG SL SUBL   Sublingual   Place 0.4 mg under the tongue as needed.           Marland Kitchen PRAVASTATIN SODIUM 40 MG PO TABS   Oral   Take 80 mg by mouth daily.             BP 144/88  Pulse 79  Temp 97.5 F (36.4 C) (Oral)  Resp 19  Ht 5\' 4"  (1.626 m)  Wt 210 lb (95.255 kg)  BMI 36.05 kg/m2  SpO2 100%  Physical Exam  HENT:  Left Ear: There is swelling and tenderness. No drainage.  Ears:       Erythema, tenderness, induration and fluctuance in preauricular/tragal  area and anterior helix    ED Course  Procedures (including critical care time)  INCISION AND DRAINAGE Performed by: Gilda Crease. Consent: Verbal consent obtained. Risks and benefits: risks, benefits and alternatives were discussed Type: abscess  Body area: face/left ear  Anesthesia: local infiltration  Incision was made with a scalpel.  Local anesthetic: lidocaine 1% without epinephrine  Anesthetic total: 3 ml  Complexity: complex Blunt dissection to break up loculations  Drainage: purulent  Drainage amount: moderate  Packing material: 1/4 in iodoform gauze  Patient tolerance: Patient tolerated the procedure well with no immediate complications.     Labs Reviewed - No data to display No results found.   No diagnosis found.    MDM  Patient presents with an abscess in the left preauricular facial area. He has been on antibiotics without improvement. I recommended incision and drainage. After discussing risks and benefits, he did consent. The drainage was performed a moderate amount of pus was drained from the area. I do not feel that the Keflex is adequate coverage, will switch to Bactrim. Patient to return in 2 days for packing removal.        Gilda Crease, MD 07/21/12  2040

## 2012-07-24 ENCOUNTER — Emergency Department (HOSPITAL_COMMUNITY)
Admission: EM | Admit: 2012-07-24 | Discharge: 2012-07-24 | Disposition: A | Discharge status: Home / Self Care | Payer: BC Managed Care – PPO | Attending: Emergency Medicine | Admitting: Emergency Medicine

## 2012-07-24 ENCOUNTER — Encounter (HOSPITAL_COMMUNITY): Payer: Self-pay | Admitting: *Deleted

## 2012-07-24 DIAGNOSIS — Z5189 Encounter for other specified aftercare: Secondary | ICD-10-CM

## 2012-07-24 DIAGNOSIS — K219 Gastro-esophageal reflux disease without esophagitis: Secondary | ICD-10-CM | POA: Insufficient documentation

## 2012-07-24 DIAGNOSIS — Z9119 Patient's noncompliance with other medical treatment and regimen: Secondary | ICD-10-CM | POA: Insufficient documentation

## 2012-07-24 DIAGNOSIS — G4730 Sleep apnea, unspecified: Secondary | ICD-10-CM | POA: Insufficient documentation

## 2012-07-24 DIAGNOSIS — Z862 Personal history of diseases of the blood and blood-forming organs and certain disorders involving the immune mechanism: Secondary | ICD-10-CM | POA: Insufficient documentation

## 2012-07-24 DIAGNOSIS — Z91199 Patient's noncompliance with other medical treatment and regimen due to unspecified reason: Secondary | ICD-10-CM | POA: Insufficient documentation

## 2012-07-24 DIAGNOSIS — Z860100 Personal history of colon polyps, unspecified: Secondary | ICD-10-CM | POA: Insufficient documentation

## 2012-07-24 DIAGNOSIS — Z791 Long term (current) use of non-steroidal anti-inflammatories (NSAID): Secondary | ICD-10-CM | POA: Insufficient documentation

## 2012-07-24 DIAGNOSIS — Z8639 Personal history of other endocrine, nutritional and metabolic disease: Secondary | ICD-10-CM | POA: Insufficient documentation

## 2012-07-24 DIAGNOSIS — Z7982 Long term (current) use of aspirin: Secondary | ICD-10-CM | POA: Insufficient documentation

## 2012-07-24 DIAGNOSIS — E785 Hyperlipidemia, unspecified: Secondary | ICD-10-CM | POA: Insufficient documentation

## 2012-07-24 DIAGNOSIS — I10 Essential (primary) hypertension: Secondary | ICD-10-CM | POA: Insufficient documentation

## 2012-07-24 DIAGNOSIS — Z79899 Other long term (current) drug therapy: Secondary | ICD-10-CM | POA: Insufficient documentation

## 2012-07-24 DIAGNOSIS — Z8719 Personal history of other diseases of the digestive system: Secondary | ICD-10-CM | POA: Insufficient documentation

## 2012-07-24 DIAGNOSIS — Z4801 Encounter for change or removal of surgical wound dressing: Secondary | ICD-10-CM | POA: Insufficient documentation

## 2012-07-24 NOTE — ED Notes (Signed)
Band aid applied,

## 2012-07-24 NOTE — ED Provider Notes (Signed)
Medical screening examination/treatment/procedure(s) were performed by non-physician practitioner and as supervising physician I was immediately available for consultation/collaboration.   Joya Gaskins, MD 07/24/12 [phone]

## 2012-07-24 NOTE — ED Notes (Signed)
For packing removal lt side of face.

## 2012-07-24 NOTE — ED Notes (Signed)
Here for recheck,  And removal of packing to lt side of face, Had I and D done.

## 2012-07-24 NOTE — ED Provider Notes (Signed)
History     CSN: [id number]  Arrival date & time 07/24/12  1135   First MD Initiated Contact with Patient 07/24/12 1307      Chief Complaint  Patient presents with  . Wound Check    (Consider location/radiation/quality/duration/timing/severity/associated sxs/prior treatment) Patient is a 67 y.o. male presenting with wound check. The history is provided by the patient.  Wound Check  He was treated in the ED 2 to 3 days ago. Previous treatment in the ED includes I&D of abscess. Treatments since wound repair include oral antibiotics. Fever duration: NONE. His temperature was unmeasured prior to arrival. There has been bloody discharge from the wound. The redness has improved. The swelling has improved. The pain has improved.    Past Medical History  Diagnosis Date  . Hypertension   . Hyperlipidemia   . GERD (gastroesophageal reflux disease)   . Barrett esophagus     History of  . Gout   . Sleep apnea     Noncompliant with CPAP  . History of adenomatous polyp of colon     Past Surgical History  Procedure Date  . Colonoscopy     Family History  Problem Relation Age of Onset  . Heart attack Father 53    First MI in 12s  . Heart attack Brother 50    History  Substance Use Topics  . Smoking status: Never Smoker   . Smokeless tobacco: Not on file     Comment: Increased exposure to second hand smoke  . Alcohol Use: No      Review of Systems  Constitutional: Negative for activity change.       All ROS Neg except as noted in HPI  HENT: Negative for nosebleeds and neck pain.   Eyes: Negative for photophobia and discharge.  Respiratory: Negative for cough, shortness of breath and wheezing.   Cardiovascular: Negative for chest pain and palpitations.  Gastrointestinal: Negative for abdominal pain and blood in stool.  Genitourinary: Negative for dysuria, frequency and hematuria.  Musculoskeletal: Positive for arthralgias. Negative for back pain.  Skin: Negative.    ABSCESS  Neurological: Negative for dizziness, seizures and speech difficulty.  Psychiatric/Behavioral: Negative for hallucinations and confusion.    Allergies  Review of patient's allergies indicates no known allergies.  Home Medications   Current Outpatient Rx  Name  Route  Sig  Dispense  Refill  . ALLOPURINOL 300 MG PO TABS   Oral   Take 300 mg by mouth daily.           Marland Kitchen ALPRAZOLAM 1 MG PO TABS   Oral   Take 1 mg by mouth 2 times daily at 12 noon and 4 pm.          . ASPIRIN EC 81 MG PO TBEC   Oral   Take 81 mg by mouth daily.         . CEPHALEXIN 500 MG PO CAPS   Oral   Take 500 mg by mouth 3 (three) times daily.         Marland Kitchen NAPROXEN SODIUM 220 MG PO TABS   Oral   Take 220 mg by mouth 2 (two) times daily with a meal.         . OMEPRAZOLE 40 MG PO CPDR   Oral   Take 40 mg by mouth daily.         Marland Kitchen PRAVASTATIN SODIUM 40 MG PO TABS   Oral   Take 80 mg by mouth daily.           Marland Kitchen  SULFAMETHOXAZOLE-TRIMETHOPRIM 800-160 MG PO TABS   Oral   Take 1 tablet by mouth every 12 (twelve) hours.   20 tablet   0   . HYDROCODONE-ACETAMINOPHEN 5-325 MG PO TABS   Oral   Take 2 tablets by mouth every 4 (four) hours as needed for pain.   10 tablet   0   . LISINOPRIL 5 MG PO TABS   Oral   Take 5 mg by mouth daily.           Marland Kitchen NITROGLYCERIN 0.4 MG SL SUBL   Sublingual   Place 0.4 mg under the tongue as needed.             BP 123/76  Pulse 73  Temp 97.8 F (36.6 C) (Oral)  Resp 18  Ht 5\' 4"  (1.626 m)  Wt 210 lb (95.255 kg)  BMI 36.05 kg/m2  SpO2 97%  Physical Exam  Nursing note and vitals reviewed. Constitutional: He is oriented to person, place, and time. He appears well-developed and well-nourished.  Non-toxic appearance.  HENT:  Head: Normocephalic.  Right Ear: Tympanic membrane and external ear normal.  Left Ear: Tympanic membrane and external ear normal.       The abscess area of the left ear and temporal area is progressing nicely. No  red streaking. Mild drainage present.  Eyes: EOM and lids are normal. Pupils are equal, round, and reactive to light.  Neck: Normal range of motion. Neck supple. Carotid bruit is not present.  Cardiovascular: Normal rate, regular rhythm, normal heart sounds, intact distal pulses and normal pulses.   Pulmonary/Chest: Breath sounds normal. No respiratory distress.  Abdominal: Soft. Bowel sounds are normal. There is no tenderness. There is no guarding.  Musculoskeletal: Normal range of motion.  Lymphadenopathy:       Head (right side): No submandibular adenopathy present.       Head (left side): No submandibular adenopathy present.    He has no cervical adenopathy.  Neurological: He is alert and oriented to person, place, and time. He has normal strength. No cranial nerve deficit or sensory deficit.  Skin: Skin is warm and dry.  Psychiatric: He has a normal mood and affect. His speech is normal.    ED Course  Procedures (including critical care time)  Labs Reviewed - No data to display No results found. Pulse oximetry 97% on room air. Within normal limits by my interpretation.  No diagnosis found.    MDM  I have reviewed nursing notes, vital signs, and all appropriate lab and imaging results for this patient. ` The patient received an incision and drainage procedure to 3 days ago for an abscess in the temporal area near the ear. The patient presents today for evaluation of this abscess area and for removal of the packing. The vital signs have been reviewed. The packing was removed by me without any problem. The patient received a sterile dressing to the site.  The patient is to continue his antibiotics and is to return to the emergency department or see his primary care physician if any changes, problems, or concerns.       Kathie Dike, [place] 07/24/12 1353

## 2012-08-07 ENCOUNTER — Emergency Department (HOSPITAL_COMMUNITY): Payer: BC Managed Care – PPO

## 2012-08-07 ENCOUNTER — Emergency Department (HOSPITAL_COMMUNITY)
Admission: EM | Admit: 2012-08-07 | Discharge: 2012-08-07 | Disposition: A | Discharge status: Home / Self Care | Payer: BC Managed Care – PPO | Attending: Emergency Medicine | Admitting: Emergency Medicine

## 2012-08-07 ENCOUNTER — Encounter (HOSPITAL_COMMUNITY): Payer: Self-pay | Admitting: Emergency Medicine

## 2012-08-07 DIAGNOSIS — Z8639 Personal history of other endocrine, nutritional and metabolic disease: Secondary | ICD-10-CM | POA: Insufficient documentation

## 2012-08-07 DIAGNOSIS — R42 Dizziness and giddiness: Secondary | ICD-10-CM | POA: Insufficient documentation

## 2012-08-07 DIAGNOSIS — Z862 Personal history of diseases of the blood and blood-forming organs and certain disorders involving the immune mechanism: Secondary | ICD-10-CM | POA: Insufficient documentation

## 2012-08-07 DIAGNOSIS — Z860100 Personal history of colon polyps, unspecified: Secondary | ICD-10-CM | POA: Insufficient documentation

## 2012-08-07 DIAGNOSIS — Z791 Long term (current) use of non-steroidal anti-inflammatories (NSAID): Secondary | ICD-10-CM | POA: Insufficient documentation

## 2012-08-07 DIAGNOSIS — I10 Essential (primary) hypertension: Secondary | ICD-10-CM | POA: Insufficient documentation

## 2012-08-07 DIAGNOSIS — E785 Hyperlipidemia, unspecified: Secondary | ICD-10-CM | POA: Insufficient documentation

## 2012-08-07 DIAGNOSIS — M549 Dorsalgia, unspecified: Secondary | ICD-10-CM | POA: Insufficient documentation

## 2012-08-07 DIAGNOSIS — K219 Gastro-esophageal reflux disease without esophagitis: Secondary | ICD-10-CM | POA: Insufficient documentation

## 2012-08-07 DIAGNOSIS — R110 Nausea: Secondary | ICD-10-CM | POA: Insufficient documentation

## 2012-08-07 DIAGNOSIS — Z79899 Other long term (current) drug therapy: Secondary | ICD-10-CM | POA: Insufficient documentation

## 2012-08-07 DIAGNOSIS — Z7982 Long term (current) use of aspirin: Secondary | ICD-10-CM | POA: Insufficient documentation

## 2012-08-07 LAB — URINALYSIS, ROUTINE W REFLEX MICROSCOPIC
Glucose, UA: NEGATIVE mg/dL
Hgb urine dipstick: NEGATIVE
Ketones, ur: NEGATIVE mg/dL
Leukocytes, UA: NEGATIVE
Protein, ur: NEGATIVE mg/dL
pH: 7.5 (ref 5.0–8.0)

## 2012-08-07 LAB — CBC WITH DIFFERENTIAL/PLATELET
Basophils Absolute: 0 10*3/uL (ref 0.0–0.1)
Basophils Relative: 0 % (ref 0–1)
Eosinophils Absolute: 0 10*3/uL (ref 0.0–0.7)
Eosinophils Relative: 1 % (ref 0–5)
HCT: 43.7 % (ref 39.0–52.0)
Hemoglobin: 14.9 g/dL (ref 13.0–17.0)
Lymphocytes Relative: 26 % (ref 12–46)
Lymphs Abs: 1.7 10*3/uL (ref 0.7–4.0)
MCH: 30.8 pg (ref 26.0–34.0)
MCHC: 34.1 g/dL (ref 30.0–36.0)
MCV: 90.3 fL (ref 78.0–100.0)
Monocytes Absolute: 0.4 10*3/uL (ref 0.1–1.0)
Monocytes Relative: 6 % (ref 3–12)
Neutro Abs: 4.4 10*3/uL (ref 1.7–7.7)
Neutrophils Relative %: 67 % (ref 43–77)
Platelets: 201 10*3/uL (ref 150–400)
RBC: 4.84 MIL/uL (ref 4.22–5.81)
RDW: 13.8 % (ref 11.5–15.5)
WBC: 6.5 10*3/uL (ref 4.0–10.5)

## 2012-08-07 LAB — COMPREHENSIVE METABOLIC PANEL
ALT: 22 U/L (ref 0–53)
AST: 19 U/L (ref 0–37)
Albumin: 3.8 g/dL (ref 3.5–5.2)
Alkaline Phosphatase: 49 U/L (ref 39–117)
BUN: 18 mg/dL (ref 6–23)
CO2: 26 mEq/L (ref 19–32)
Calcium: 8.9 mg/dL (ref 8.4–10.5)
Chloride: 102 mEq/L (ref 96–112)
Creatinine, Ser: 0.95 mg/dL (ref 0.50–1.35)
GFR calc Af Amer: >90 mL/min (ref >90)
GFR calc non Af Amer: 86 mL/min — ABNORMAL LOW (ref >90)
Glucose, Bld: 100 mg/dL — ABNORMAL HIGH (ref 70–99)
Potassium: 4.3 mEq/L (ref 3.5–5.1)
Sodium: 135 mEq/L (ref 135–145)
Total Bilirubin: 0.5 mg/dL (ref 0.3–1.2)
Total Protein: 6.5 g/dL (ref 6.0–8.3)

## 2012-08-07 MED ORDER — MECLIZINE HCL 12.5 MG PO TABS
25.0000 mg | ORAL_TABLET | Freq: Once | ORAL | Status: AC
  Administered 2012-08-07: 25 mg via ORAL
  Filled 2012-08-07 (×2): qty 1

## 2012-08-07 MED ORDER — SODIUM CHLORIDE 0.9 % IV BOLUS (SEPSIS)
500.0000 mL | Freq: Once | INTRAVENOUS | Status: AC
  Administered 2012-08-07: 500 mL via INTRAVENOUS

## 2012-08-07 MED ORDER — MECLIZINE HCL 25 MG PO TABS: 25.0000 mg | ORAL_TABLET | Freq: Four times a day (QID) | ORAL | Status: DC

## 2012-08-07 MED ORDER — SODIUM CHLORIDE 0.9 % IV SOLN
INTRAVENOUS | Status: DC
  Administered 2012-08-07: 500 mL via INTRAVENOUS

## 2012-08-07 MED ORDER — ONDANSETRON 4 MG PO TBDP: 4.0000 mg | ORAL_TABLET | Freq: Three times a day (TID) | ORAL | Status: DC | PRN

## 2012-08-07 MED ORDER — ONDANSETRON HCL 4 MG/2ML IJ SOLN
4.0000 mg | Freq: Once | INTRAMUSCULAR | Status: AC
  Administered 2012-08-07: 4 mg via INTRAVENOUS
  Filled 2012-08-07: qty 2

## 2012-08-07 NOTE — ED Provider Notes (Signed)
History    This chart was scribed for Bradley Jakes, MD, MD by Smitty Pluck, ED Scribe. The patient was seen in room APA19/APA19 and the patient's care was started at 8:23 AM.   CSN: [id number]  Arrival date & time 08/07/12  4098       Chief Complaint  Patient presents with  . Dizziness    The history is provided by the patient. No language interpreter was used.   Bradley [name] is a 67 y.o. male who presents to the Emergency Department complaining of intermittent, moderate dizziness onset 2 days ago that is gradually worsening. Pt reports that dizziness is worse in the morning. He reports that he has room spinning sensation. He states that he has told hold on to things while walking. He reports having nausea and mild back pain. Pt denies vomiting, congestion, cough, sore throat, fever, chest pain, rhinorrhea, SOB, abdominal pain, diarrhea, dysuria, hematuria, rash, hx of bleeding easily, headaches and any other pain.    PCP is Lilyan Punt   Past Medical History  Diagnosis Date  . Hypertension   . Hyperlipidemia   . GERD (gastroesophageal reflux disease)   . Barrett esophagus     History of  . Gout   . Sleep apnea     Noncompliant with CPAP  . History of adenomatous polyp of colon     Past Surgical History  Procedure Date  . Colonoscopy   . Tonsillectomy     Family History  Problem Relation Age of Onset  . Heart attack Father 48    First MI in 78s  . Heart attack Brother 50    History  Substance Use Topics  . Smoking status: Never Smoker   . Smokeless tobacco: Not on file     Comment: Increased exposure to second hand smoke  . Alcohol Use: No      Review of Systems  Constitutional: Negative for fever and chills.  HENT: Negative for congestion, sore throat and rhinorrhea.   Respiratory: Negative for cough and shortness of breath.   Cardiovascular: Negative for chest pain.  Gastrointestinal: Positive for nausea. Negative for vomiting, abdominal pain  and diarrhea.  Genitourinary: Negative for dysuria and hematuria.  Musculoskeletal: Positive for back pain.  Skin: Negative for rash.  Neurological: Positive for dizziness. Negative for syncope.  All other systems reviewed and are negative.    Allergies  Review of patient's allergies indicates no known allergies.  Home Medications   Current Outpatient Rx  Name  Route  Sig  Dispense  Refill  . ALLOPURINOL 100 MG PO TABS   Oral   Take 100 mg by mouth daily.         Marland Kitchen ALPRAZOLAM 1 MG PO TABS   Oral   Take 1 mg by mouth 2 times daily at 12 noon and 4 pm.          . ASPIRIN EC 81 MG PO TBEC   Oral   Take 81 mg by mouth daily.         . ETODOLAC 400 MG PO TABS   Oral   Take 400 mg by mouth daily.         Marland Kitchen HYDROCODONE-ACETAMINOPHEN 5-325 MG PO TABS   Oral   Take 2 tablets by mouth every 4 (four) hours as needed for pain.   10 tablet   0   . LISINOPRIL 10 MG PO TABS   Oral   Take 5 mg by mouth daily.         Marland Kitchen  NAPROXEN SODIUM 220 MG PO TABS   Oral   Take 220 mg by mouth 2 (two) times daily as needed. Pain         . OMEPRAZOLE 20 MG PO CPDR   Oral   Take 20 mg by mouth daily.         Marland Kitchen PRAVASTATIN SODIUM 40 MG PO TABS   Oral   Take 80 mg by mouth daily.           Marland Kitchen MECLIZINE HCL 25 MG PO TABS   Oral   Take 1 tablet (25 mg total) by mouth 4 (four) times daily.   28 tablet   0   . NITROGLYCERIN 0.4 MG SL SUBL   Sublingual   Place 0.4 mg under the tongue as needed. Chest Pain           BP 131/84  Pulse 67  Temp 97.7 F (36.5 C) (Oral)  Resp 19  Ht 5\' 4"  (1.626 m)  Wt 210 lb (95.255 kg)  BMI 36.05 kg/m2  SpO2 97%  Physical Exam  Nursing note and vitals reviewed. Constitutional: He is oriented to person, place, and time. He appears well-developed and well-nourished. No distress.  HENT:  Head: Normocephalic and atraumatic.  Right Ear: External ear normal.  Left Ear: External ear normal.  Mouth/Throat: Oropharynx is clear and  moist.       Small boil at top of ear. Well healed. No signs of infection.   Eyes: Conjunctivae normal are normal. Pupils are equal, round, and reactive to light. No scleral icterus.  Cardiovascular: Normal rate, regular rhythm and normal heart sounds.   Pulmonary/Chest: Effort normal and breath sounds normal. No respiratory distress.  Abdominal: Soft. Bowel sounds are normal. He exhibits no distension. There is no tenderness. There is no rebound.  Neurological: He is alert and oriented to person, place, and time. No cranial nerve deficit.  Skin: Skin is warm and dry.  Psychiatric: He has a normal mood and affect. His behavior is normal.    ED Course  Procedures (including critical care time) DIAGNOSTIC STUDIES: Oxygen Saturation is 97% on room air, adequate by my interpretation.    COORDINATION OF CARE: 8:30 AM Discussed ED treatment with pt and pt agrees.  9:47 AM Recheck: Talked to pt about treatment plan after pt had more questions.     Labs Reviewed  COMPREHENSIVE METABOLIC PANEL - Abnormal; Notable for the following:    Glucose, Bld 100 (*)     GFR calc non Af Amer 86 (*)     All other components within normal limits  URINALYSIS, ROUTINE W REFLEX MICROSCOPIC - Abnormal; Notable for the following:    Bilirubin Urine MODERATE (*)     All other components within normal limits  CBC WITH DIFFERENTIAL   Dg Chest 2 View  08/07/2012  *RADIOLOGY REPORT*  Clinical Data: Dizziness for 2 days, history hypertension, GERD  CHEST - 2 VIEW  Comparison: None  Findings: Upper-normal size of cardiac silhouette. Tortuous aorta. Pulmonary vascularity normal. Lungs clear. No pleural effusion or pneumothorax. Scattered mild endplate spur formation thoracic spine.  IMPRESSION: No acute abnormalities.   Original Report Authenticated By: Ulyses Southward, M.D.    Ct Head Wo Contrast  08/07/2012  *RADIOLOGY REPORT*  Clinical Data: Dizziness.  CT HEAD WITHOUT CONTRAST  Technique:  Contiguous axial images were  obtained from the base of the skull through the vertex without contrast.  Comparison: MR brain 09/04/2008.  Findings: No evidence of acute  infarct, acute hemorrhage, mass lesion, mass effect or hydrocephalus.  Mild atrophy.  Visualized portions of the paranasal sinuses are clear, including mastoid air cells.  IMPRESSION: No acute intracranial abnormality.   Original Report Authenticated By: Leanna Battles, M.D.    Results for orders placed during the hospital encounter of 08/07/12  CBC WITH DIFFERENTIAL      Component Value Range   WBC 6.5  4.0 - 10.5 K/uL   RBC 4.84  4.22 - 5.81 MIL/uL   Hemoglobin 14.9  13.0 - 17.0 g/dL   HCT 16.1  09.6 - 04.5 %   MCV 90.3  78.0 - 100.0 fL   MCH 30.8  26.0 - 34.0 pg   MCHC 34.1  30.0 - 36.0 g/dL   RDW 40.9  81.1 - 91.4 %   Platelets 201  150 - 400 K/uL   Neutrophils Relative 67  43 - 77 %   Neutro Abs 4.4  1.7 - 7.7 K/uL   Lymphocytes Relative 26  12 - 46 %   Lymphs Abs 1.7  0.7 - 4.0 K/uL   Monocytes Relative 6  3 - 12 %   Monocytes Absolute 0.4  0.1 - 1.0 K/uL   Eosinophils Relative 1  0 - 5 %   Eosinophils Absolute 0.0  0.0 - 0.7 K/uL   Basophils Relative 0  0 - 1 %   Basophils Absolute 0.0  0.0 - 0.1 K/uL  COMPREHENSIVE METABOLIC PANEL      Component Value Range   Sodium 135  135 - 145 mEq/L   Potassium 4.3  3.5 - 5.1 mEq/L   Chloride 102  96 - 112 mEq/L   CO2 26  19 - 32 mEq/L   Glucose, Bld 100 (*) 70 - 99 mg/dL   BUN 18  6 - 23 mg/dL   Creatinine, Ser 7.82  0.50 - 1.35 mg/dL   Calcium 8.9  8.4 - 95.6 mg/dL   Total Protein 6.5  6.0 - 8.3 g/dL   Albumin 3.8  3.5 - 5.2 g/dL   AST 19  0 - 37 U/L   ALT 22  0 - 53 U/L   Alkaline Phosphatase 49  39 - 117 U/L   Total Bilirubin 0.5  0.3 - 1.2 mg/dL   GFR calc non Af Amer 86 (*) >90 mL/min   GFR calc Af Amer >90  >90 mL/min  URINALYSIS, ROUTINE W REFLEX MICROSCOPIC      Component Value Range   Color, Urine YELLOW  YELLOW   APPearance CLEAR  CLEAR   Specific Gravity, Urine 1.015  1.005 -  1.030   pH 7.5  5.0 - 8.0   Glucose, UA NEGATIVE  NEGATIVE mg/dL   Hgb urine dipstick NEGATIVE  NEGATIVE   Bilirubin Urine MODERATE (*) NEGATIVE   Ketones, ur NEGATIVE  NEGATIVE mg/dL   Protein, ur NEGATIVE  NEGATIVE mg/dL   Urobilinogen, UA 0.2  0.0 - 1.0 mg/dL   Nitrite NEGATIVE  NEGATIVE   Leukocytes, UA NEGATIVE  NEGATIVE     Date: 08/07/2012  Rate: 60  Rhythm: normal sinus rhythm and premature ventricular contractions (PVC)  QRS Axis: normal  Intervals: normal  ST/T Wave abnormalities: normal  Conduction Disutrbances:none  Narrative Interpretation:   Old EKG Reviewed: none available     1. Vertigo       MDM   Symptoms most likely related to benign vertigo. Today's workup without any specific abnormalities on head CT chest x-ray or lab work. Patient with  some improvement with Antivert. Patient will followup with primary care Dr. In 5 days if not better. At that point in time MRI and/or followup with neurology her nose and throat may be needed.     I personally performed the services described in this documentation, which was scribed in my presence. The recorded information has been reviewed and is accurate.     Bradley Jakes, MD 08/07/12 [phone]

## 2012-08-07 NOTE — ED Notes (Signed)
Pt states dizziness x2 days. States that it is worse in the morning when he first gets up, notes that he had an abcess drained near his left ear 2 weeks ago. Pt aax4. Grips equal, pupils equal, denies numbness tingling or blurred vision.

## 2013-02-12 ENCOUNTER — Encounter: Payer: Self-pay | Admitting: Family Medicine

## 2013-02-12 ENCOUNTER — Ambulatory Visit (INDEPENDENT_AMBULATORY_CARE_PROVIDER_SITE_OTHER): Payer: BC Managed Care – PPO | Admitting: Family Medicine

## 2013-02-12 VITALS — BP 132/80 | Temp 98.4°F | Wt 219.0 lb

## 2013-02-12 DIAGNOSIS — R109 Unspecified abdominal pain: Secondary | ICD-10-CM

## 2013-02-12 DIAGNOSIS — K219 Gastro-esophageal reflux disease without esophagitis: Secondary | ICD-10-CM

## 2013-02-12 LAB — POCT URINALYSIS DIPSTICK: pH, UA: 5

## 2013-02-12 MED ORDER — MELOXICAM 15 MG PO TABS: 15.0000 mg | ORAL_TABLET | Freq: Every day | ORAL | Status: DC

## 2013-02-12 NOTE — Progress Notes (Signed)
  Subjective:    Patient ID: Bradley [name], male    DOB: 03-30-1946, 67 y.o.   MRN: [id number]  Abdominal Pain This is a new problem. The current episode started 1 to 4 weeks ago. The pain is located in the left flank. The pain is severe. The pain is aggravated by movement and certain positions.      Review of Systems  Gastrointestinal: Positive for abdominal pain.       Objective:   Physical Exam Neck no masses lungs clear no crackles Heart is regular no murmurs pulses are normal Skin warm and dry Abdomen soft no guarding rebound or tenderness. Orthopedically some subjective discomfort in the left hip region although minimal       Assessment & Plan:  More than likely a muscle strain-Mobic 15 mg 1 daily no other anti-inflammatories with it if not doing well within 2-3 weeks consider further testing possibly a scan Patient has history of Barrett's esophagus as well as needing a screening colonoscopy he is due for this. Referral to gastroenterology.

## 2013-02-16 ENCOUNTER — Encounter: Payer: Self-pay | Admitting: Internal Medicine

## 2013-03-13 ENCOUNTER — Encounter: Payer: Self-pay | Admitting: Internal Medicine

## 2013-03-14 ENCOUNTER — Other Ambulatory Visit: Payer: Self-pay | Admitting: Internal Medicine

## 2013-03-14 ENCOUNTER — Encounter: Payer: Self-pay | Admitting: Gastroenterology

## 2013-03-14 ENCOUNTER — Ambulatory Visit (INDEPENDENT_AMBULATORY_CARE_PROVIDER_SITE_OTHER): Payer: BC Managed Care – PPO | Admitting: Gastroenterology

## 2013-03-14 VITALS — BP 160/92 | HR 66 | Temp 97.6°F | Ht 64.0 in | Wt 223.2 lb

## 2013-03-14 DIAGNOSIS — Z860100 Personal history of colon polyps, unspecified: Secondary | ICD-10-CM | POA: Insufficient documentation

## 2013-03-14 DIAGNOSIS — K2270 Barrett's esophagus without dysplasia: Secondary | ICD-10-CM

## 2013-03-14 DIAGNOSIS — K219 Gastro-esophageal reflux disease without esophagitis: Secondary | ICD-10-CM

## 2013-03-14 MED ORDER — PEG-KCL-NACL-NASULF-NA ASC-C 100 G PO SOLR: 1.0000 | ORAL | Status: DC

## 2013-03-14 NOTE — Assessment & Plan Note (Addendum)
Patient is a history of colonic adenomas. Mother had colon cancer, age greater than 88. He is due for followup colonoscopy at this time. Denies any GI problems. I have discussed the risks, alternatives, benefits with regards to but not limited to the risk of reaction to medication, bleeding, infection, perforation and the patient is agreeable to proceed. Written consent to be obtained.

## 2013-03-14 NOTE — Progress Notes (Addendum)
Primary Care Physician:  Lilyan Punt, MD  Primary Gastroenterologist:  Roetta Sessions, MD   Chief Complaint  Patient presents with  . update colonoscopy/egd    HPI:  Bradley [name] is a 67 y.o. male here to schedule colonoscopy and upper endoscopy as he is due to update his procedures. He has a history of colonic adenomas removed previously. His last colonoscopy was in 2009. Also with a history of Barrett's esophagus, last EGD in 2009.  States he is doing well. No issues. No constipation, diarrhea, melena, brbpr. No dysphagia. Heartburn well-controlled on omeprazole. No weight loss. Following antireflux measures.  Recently he was having left flank/abdominal pain with bending over or other movements. Pain was unrelated to meals or BMs. PCP diagnosed with muscle strain and give Mobic. Abdominal pain resolved.  Current Outpatient Prescriptions  Medication Sig Dispense Refill  . allopurinol (ZYLOPRIM) 100 MG tablet Take 100 mg by mouth daily.      Marland Kitchen ALPRAZolam (XANAX) 1 MG tablet Take 1 mg by mouth 2 times daily at 12 noon and 4 pm.       . aspirin EC 81 MG tablet Take 81 mg by mouth daily.      Marland Kitchen lisinopril (PRINIVIL,ZESTRIL) 10 MG tablet Take 5 mg by mouth daily.      Marland Kitchen omeprazole (PRILOSEC) 20 MG capsule Take 20 mg by mouth daily.      . pravastatin (PRAVACHOL) 40 MG tablet Take 80 mg by mouth daily.        . peg 3350 powder (MOVIPREP) 100 G SOLR Take 1 kit (200 g total) by mouth as directed.  1 kit  0   No current facility-administered medications for this visit.    Allergies as of 03/14/2013  . (No Known Allergies)    Past Medical History  Diagnosis Date  . Hypertension   . Hyperlipidemia   . GERD (gastroesophageal reflux disease)   . Barrett esophagus     History of  . Gout   . Sleep apnea     Noncompliant with CPAP  . History of adenomatous polyp of colon     Past Surgical History  Procedure Laterality Date  . Colonoscopy    . Tonsillectomy    .  Esophagogastroduodenoscopy  07/01/2003    ION:GEXBMW colored epithelium involving essentially nearly the distal onehalf of the tubular esophagus/Patulous esophagogastric junction/ Moderate sized hiatal hernia/Otherwise normal stomach, normal D1 and D2.  . Esophagogastroduodenoscopy  12/09/2004    UXL:[id number] epithelium  beginning at 33 cm consistent with previous diagnosis of Barrett's esophagus  segmentally biopsied. Hiatal hernia. Otherwise normal stomach. Normal D1 and D2  . Colonoscopy  12/09/2004    YQI:HKVQQVZDGL polyp at 35 cm biopsied/normal rectum  . Esophagogastroduodenoscopy   05/20/2008    OVF:IEPPIR-JJOACZY epithelium distal esophagus consistent with Barrett's esophagus.  No significant change from prior exam. Multiple biopsies negative for dysplasia.  . Colonoscopy   05/20/2008    SAY:TKZSW midsigmoid s/p snare removal/normal rectum. Tubular adenoma    Family History  Problem Relation Age of Onset  . Heart attack Father 75    First MI in 29s  . Heart attack Brother 50  . Colon cancer Mother     age greater than 36s    History   Social History  . Marital Status: Single    Spouse Name: N/A    Number of Children: N/A  . Years of Education: N/A   Occupational History  . Retired    Social History Main Topics  .  Smoking status: Never Smoker   . Smokeless tobacco: Not on file     Comment: Increased exposure to second hand smoke  . Alcohol Use: No  . Drug Use: No  . Sexual Activity: Not on file   Other Topics Concern  . Not on file   Social History Narrative   Single    2 children      ROS:  General: Negative for anorexia, weight loss, fever, chills, fatigue, weakness. Eyes: Negative for vision changes.  ENT: Negative for hoarseness, difficulty swallowing , nasal congestion. CV: Negative for chest pain, angina, palpitations, dyspnea on exertion, peripheral edema.  Respiratory: Negative for dyspnea at rest, dyspnea on exertion, cough, sputum,  wheezing.  GI: See history of present illness. GU:  Negative for dysuria, hematuria, urinary incontinence, urinary frequency, nocturnal urination.  MS: Negative for joint pain, low back pain.  Derm: Negative for rash or itching.  Neuro: Negative for weakness, abnormal sensation, seizure, frequent headaches, memory loss, confusion.  Psych: Negative for anxiety, depression, suicidal ideation, hallucinations.  Endo: Negative for unusual weight change.  Heme: Negative for bruising or bleeding. Allergy: Negative for rash or hives.    Physical Examination:  BP 160/92  Pulse 66  Temp(Src) 97.6 F (36.4 C) (Oral)  Ht 5\' 4"  (1.626 m)  Wt 223 lb 3.2 oz (101.243 kg)  BMI 38.29 kg/m2   General: Well-nourished, well-developed in no acute distress.  Head: Normocephalic, atraumatic.   Eyes: Conjunctiva pink, no icterus. Mouth: Oropharyngeal mucosa moist and pink , no lesions erythema or exudate. Neck: Supple without thyromegaly, masses, or lymphadenopathy.  Lungs: Clear to auscultation bilaterally.  Heart: Regular rate and rhythm, no murmurs rubs or gallops.  Abdomen: Bowel sounds are normal, nontender, nondistended, no hepatosplenomegaly or masses, no abdominal bruits or    hernia , no rebound or guarding.   Rectal: Deferred Extremities: No lower extremity edema. No clubbing or deformities.  Neuro: Alert and oriented x 4 , grossly normal neurologically.  Skin: Warm and dry, no rash or jaundice.   Psych: Alert and cooperative, normal mood and affect.

## 2013-03-14 NOTE — Patient Instructions (Addendum)
We have scheduled you for a colonoscopy and upper endoscopy with Dr. Rourk. Please see separate instructions. 

## 2013-03-14 NOTE — Progress Notes (Signed)
CC'd to PCP 

## 2013-03-14 NOTE — Assessment & Plan Note (Addendum)
Due for a followup EGD for history of Barrett's esophagus.  I have discussed the risks, alternatives, benefits with regards to but not limited to the risk of reaction to medication, bleeding, infection, perforation and the patient is agreeable to proceed. Written consent to be obtained.  No longer on Nexium. Currently on omeprazole with good control of reflux symptoms.

## 2013-03-15 ENCOUNTER — Encounter (HOSPITAL_COMMUNITY): Payer: Self-pay | Admitting: Pharmacy Technician

## 2013-03-21 ENCOUNTER — Telehealth: Payer: Self-pay | Admitting: General Practice

## 2013-03-21 NOTE — Telephone Encounter (Signed)
Per the patient he called his insurance company and was told that they will pay 100% of everything except for the bx's of polyps.  He is still going to go forward with his procedure.  Per Karma Lew Bx's range in price from $300.00-$360.00 per specimen.  Made the patient aware of this.

## 2013-03-22 ENCOUNTER — Encounter (HOSPITAL_COMMUNITY)
Admission: RE | Disposition: A | Discharge status: Home / Self Care | Payer: Self-pay | Source: Ambulatory Visit | Attending: Internal Medicine

## 2013-03-22 ENCOUNTER — Ambulatory Visit (HOSPITAL_COMMUNITY)
Admission: RE | Admit: 2013-03-22 | Discharge: 2013-03-22 | Disposition: A | Discharge status: Home / Self Care | Payer: BC Managed Care – PPO | Source: Ambulatory Visit | Attending: Internal Medicine | Admitting: Internal Medicine

## 2013-03-22 ENCOUNTER — Encounter (HOSPITAL_COMMUNITY): Payer: Self-pay | Admitting: *Deleted

## 2013-03-22 DIAGNOSIS — Z860100 Personal history of colon polyps, unspecified: Secondary | ICD-10-CM | POA: Insufficient documentation

## 2013-03-22 DIAGNOSIS — Z1211 Encounter for screening for malignant neoplasm of colon: Secondary | ICD-10-CM

## 2013-03-22 DIAGNOSIS — Z09 Encounter for follow-up examination after completed treatment for conditions other than malignant neoplasm: Secondary | ICD-10-CM | POA: Insufficient documentation

## 2013-03-22 DIAGNOSIS — K2270 Barrett's esophagus without dysplasia: Secondary | ICD-10-CM | POA: Insufficient documentation

## 2013-03-22 DIAGNOSIS — K222 Esophageal obstruction: Secondary | ICD-10-CM

## 2013-03-22 DIAGNOSIS — I10 Essential (primary) hypertension: Secondary | ICD-10-CM | POA: Insufficient documentation

## 2013-03-22 DIAGNOSIS — K219 Gastro-esophageal reflux disease without esophagitis: Secondary | ICD-10-CM

## 2013-03-22 HISTORY — PX: COLONOSCOPY WITH ESOPHAGOGASTRODUODENOSCOPY (EGD): SHX5779

## 2013-03-22 SURGERY — COLONOSCOPY WITH ESOPHAGOGASTRODUODENOSCOPY (EGD)
Anesthesia: Moderate Sedation

## 2013-03-22 MED ORDER — SODIUM CHLORIDE 0.9 % IV SOLN
INTRAVENOUS | Status: DC
  Administered 2013-03-22: 09:00:00 via INTRAVENOUS

## 2013-03-22 MED ORDER — MEPERIDINE HCL 100 MG/ML IJ SOLN
INTRAMUSCULAR | Status: DC | PRN
  Administered 2013-03-22: 25 mg via INTRAVENOUS
  Administered 2013-03-22: 50 mg via INTRAVENOUS
  Administered 2013-03-22: 25 mg via INTRAVENOUS

## 2013-03-22 MED ORDER — STERILE WATER FOR IRRIGATION IR SOLN
Status: DC | PRN
  Administered 2013-03-22: 10:00:00

## 2013-03-22 MED ORDER — MEPERIDINE HCL 100 MG/ML IJ SOLN
INTRAMUSCULAR | Status: AC
  Filled 2013-03-22: qty 2

## 2013-03-22 MED ORDER — MIDAZOLAM HCL 5 MG/5ML IJ SOLN
INTRAMUSCULAR | Status: AC
  Filled 2013-03-22: qty 10

## 2013-03-22 MED ORDER — ONDANSETRON HCL 4 MG/2ML IJ SOLN
INTRAMUSCULAR | Status: AC
  Filled 2013-03-22: qty 2

## 2013-03-22 MED ORDER — BUTAMBEN-TETRACAINE-BENZOCAINE 2-2-14 % EX AERO
INHALATION_SPRAY | CUTANEOUS | Status: DC | PRN
  Administered 2013-03-22: 2 via TOPICAL

## 2013-03-22 MED ORDER — ONDANSETRON HCL 4 MG/2ML IJ SOLN
INTRAMUSCULAR | Status: DC | PRN
  Administered 2013-03-22: 4 mg via INTRAVENOUS

## 2013-03-22 MED ORDER — MIDAZOLAM HCL 5 MG/5ML IJ SOLN
INTRAMUSCULAR | Status: DC | PRN
  Administered 2013-03-22: 2 mg via INTRAVENOUS
  Administered 2013-03-22: 1 mg via INTRAVENOUS
  Administered 2013-03-22: 2 mg via INTRAVENOUS
  Administered 2013-03-22: 1 mg via INTRAVENOUS

## 2013-03-22 NOTE — H&P (View-Only) (Signed)
Primary Care Physician:  LUKING,SCOTT, MD  Primary Gastroenterologist:  Michael Rourk, MD   Chief Complaint  Patient presents with  . update colonoscopy/egd    HPI:  Bradley [name] is a 66 y.o. male here to schedule colonoscopy and upper endoscopy as he is due to update his procedures. He has a history of colonic adenomas removed previously. His last colonoscopy was in 2009. Also with a history of Barrett's esophagus, last EGD in 2009.  States he is doing well. No issues. No constipation, diarrhea, melena, brbpr. No dysphagia. Heartburn well-controlled on omeprazole. No weight loss. Following antireflux measures.  Recently he was having left flank/abdominal pain with bending over or other movements. Pain was unrelated to meals or BMs. PCP diagnosed with muscle strain and give Mobic. Abdominal pain resolved.  Current Outpatient Prescriptions  Medication Sig Dispense Refill  . allopurinol (ZYLOPRIM) 100 MG tablet Take 100 mg by mouth daily.      . ALPRAZolam (XANAX) 1 MG tablet Take 1 mg by mouth 2 times daily at 12 noon and 4 pm.       . aspirin EC 81 MG tablet Take 81 mg by mouth daily.      . lisinopril (PRINIVIL,ZESTRIL) 10 MG tablet Take 5 mg by mouth daily.      . omeprazole (PRILOSEC) 20 MG capsule Take 20 mg by mouth daily.      . pravastatin (PRAVACHOL) 40 MG tablet Take 80 mg by mouth daily.        . peg 3350 powder (MOVIPREP) 100 G SOLR Take 1 kit (200 g total) by mouth as directed.  1 kit  0   No current facility-administered medications for this visit.    Allergies as of 03/14/2013  . (No Known Allergies)    Past Medical History  Diagnosis Date  . Hypertension   . Hyperlipidemia   . GERD (gastroesophageal reflux disease)   . Barrett esophagus     History of  . Gout   . Sleep apnea     Noncompliant with CPAP  . History of adenomatous polyp of colon     Past Surgical History  Procedure Laterality Date  . Colonoscopy    . Tonsillectomy    .  Esophagogastroduodenoscopy  07/01/2003    RMR:Salmon colored epithelium involving essentially nearly the distal onehalf of the tubular esophagus/Patulous esophagogastric junction/ Moderate sized hiatal hernia/Otherwise normal stomach, normal D1 and D2.  . Esophagogastroduodenoscopy  12/09/2004    RMR:Salmon-colored epithelium  beginning at 33 cm consistent with previous diagnosis of Barrett's esophagus  segmentally biopsied. Hiatal hernia. Otherwise normal stomach. Normal D1 and D2  . Colonoscopy  12/09/2004    RMR:Diminutive polyp at 35 cm biopsied/normal rectum  . Esophagogastroduodenoscopy   05/20/2008    RMR:salmon-colored epithelium distal esophagus consistent with Barrett's esophagus.  No significant change from prior exam. Multiple biopsies negative for dysplasia.  . Colonoscopy   05/20/2008    RMR:Polyp midsigmoid s/p snare removal/normal rectum. Tubular adenoma    Family History  Problem Relation Age of Onset  . Heart attack Father 71    First MI in 50s  . Heart attack Brother 50  . Colon cancer Mother     age greater than 60s    History   Social History  . Marital Status: Single    Spouse Name: N/A    Number of Children: N/A  . Years of Education: N/A   Occupational History  . Retired    Social History Main Topics  .   Smoking status: Never Smoker   . Smokeless tobacco: Not on file     Comment: Increased exposure to second hand smoke  . Alcohol Use: No  . Drug Use: No  . Sexual Activity: Not on file   Other Topics Concern  . Not on file   Social History Narrative   Single    2 children      ROS:  General: Negative for anorexia, weight loss, fever, chills, fatigue, weakness. Eyes: Negative for vision changes.  ENT: Negative for hoarseness, difficulty swallowing , nasal congestion. CV: Negative for chest pain, angina, palpitations, dyspnea on exertion, peripheral edema.  Respiratory: Negative for dyspnea at rest, dyspnea on exertion, cough, sputum,  wheezing.  GI: See history of present illness. GU:  Negative for dysuria, hematuria, urinary incontinence, urinary frequency, nocturnal urination.  MS: Negative for joint pain, low back pain.  Derm: Negative for rash or itching.  Neuro: Negative for weakness, abnormal sensation, seizure, frequent headaches, memory loss, confusion.  Psych: Negative for anxiety, depression, suicidal ideation, hallucinations.  Endo: Negative for unusual weight change.  Heme: Negative for bruising or bleeding. Allergy: Negative for rash or hives.    Physical Examination:  BP 160/92  Pulse 66  Temp(Src) 97.6 F (36.4 C) (Oral)  Ht 5' 4" (1.626 m)  Wt 223 lb 3.2 oz (101.243 kg)  BMI 38.29 kg/m2   General: Well-nourished, well-developed in no acute distress.  Head: Normocephalic, atraumatic.   Eyes: Conjunctiva pink, no icterus. Mouth: Oropharyngeal mucosa moist and pink , no lesions erythema or exudate. Neck: Supple without thyromegaly, masses, or lymphadenopathy.  Lungs: Clear to auscultation bilaterally.  Heart: Regular rate and rhythm, no murmurs rubs or gallops.  Abdomen: Bowel sounds are normal, nontender, nondistended, no hepatosplenomegaly or masses, no abdominal bruits or    hernia , no rebound or guarding.   Rectal: Deferred Extremities: No lower extremity edema. No clubbing or deformities.  Neuro: Alert and oriented x 4 , grossly normal neurologically.  Skin: Warm and dry, no rash or jaundice.   Psych: Alert and cooperative, normal mood and affect.   

## 2013-03-22 NOTE — Interval H&P Note (Signed)
History and Physical Interval Note:  03/22/2013 9:37 AM  Bradley [name]  has presented today for surgery, with the diagnosis of HISTORY OF COLON POLYPS AND GERD AND BARRETTS ESOPHAGUS  The various methods of treatment have been discussed with the patient and family. After consideration of risks, benefits and other options for treatment, the patient has consented to  Procedure(s) with comments: COLONOSCOPY WITH ESOPHAGOGASTRODUODENOSCOPY (EGD) (N/A) - 9:30 as a surgical intervention .  The patient's history has been reviewed, patient examined, no change in status, stable for surgery.  I have reviewed the patient's chart and labs.  Questions were answered to the patient's satisfaction.     Bradley [name]  Patient seen and examined. No change. EGD and colonoscopy per plan.The risks, benefits, limitations, imponderables and alternatives regarding both EGD and colonoscopy have been reviewed with the patient. Questions have been answered. All parties agreeable.

## 2013-03-22 NOTE — Op Note (Signed)
[facility] [address] [place], [address]   COLONOSCOPY PROCEDURE REPORT  PATIENT: [name], Bradley  MR#:         [id number] BIRTHDATE: September 13, 1945 , 66  yrs. old GENDER: Male ENDOSCOPIST: R.  Roetta Sessions, MD FACP FACG REFERRED BY:  Lilyan Punt, M.D. PROCEDURE DATE:  03/22/2013 PROCEDURE:     Ileocolonoscopy-surveillance  INDICATIONS: History of colonic adenoma  INFORMED CONSENT:  The risks, benefits, alternatives and imponderables including but not limited to bleeding, perforation as well as the possibility of a missed lesion have been reviewed.  The potential for biopsy, lesion removal, etc. have also been discussed.  Questions have been answered.  All parties agreeable. Please see the history and physical in the medical record for more information.  MEDICATIONS: Versed 6 mg IV and Demerol 100 mg IV in  divided doses. Zofran 4 mg IV  DESCRIPTION OF PROCEDURE:  After a digital rectal exam was performed, the EG-2990i ([id number]) and EC-3890Li ([id number]) colonoscope was advanced from the anus through the rectum and colon to the area of the cecum, ileocecal valve and appendiceal orifice. The cecum was deeply intubated.  These structures were well-seen and photographed for the record.  From the level of the cecum and ileocecal valve, the scope was slowly and cautiously withdrawn. The mucosal surfaces were carefully surveyed utilizing scope tip deflection to facilitate fold flattening as needed.  The scope was pulled down into the rectum where a thorough examination including retroflexion was performed.    FINDINGS:  Adequate preparation. Normal rectum. Normal  colonic mucosa.  Distal 5 cm of terminal ileal mucosa appeared normal..  THERAPEUTIC / DIAGNOSTIC MANEUVERS PERFORMED:  None  COMPLICATIONS: None  CECAL WITHDRAWAL TIME:  9 minutes  IMPRESSION:  Normal colonoscopy.  RECOMMENDATIONS: Recommend repeat surveillance examination in  5 years.   _______________________________ eSigned:  R. Roetta Sessions, MD FACP [facility] 03/22/2013 10:32 AM   CC:

## 2013-03-22 NOTE — Op Note (Signed)
[facility] [address] [place], [address]   ENDOSCOPY PROCEDURE REPORT  PATIENT: Bradley [name], Bradley [name]  MR#: [id number] BIRTHDATE: April 02, 1946 , 66  yrs. old GENDER: Male ENDOSCOPIST: R.  Roetta Sessions, MD FACP FACG REFERRED BY:  Lilyan Punt, M.D. PROCEDURE DATE:  03/22/2013 PROCEDURE:     EGD with esophageal biopsy  INDICATIONS:     Barrett's esophagus surveillance examination  INFORMED CONSENT:   The risks, benefits, limitations, alternatives and imponderables have been discussed.  The potential for biopsy, esophogeal dilation, etc. have also been reviewed.  Questions have been answered.  All parties agreeable.  Please see the history and physical in the medical record for more information.  MEDICATIONS:     Versed 4 mg IV and Demerol 75 mg IV in divided doses. Zofran 4 mg IV. Cetacaine spray.  DESCRIPTION OF PROCEDURE:   The EG-2990i ([id number])  endoscope was introduced through the mouth and advanced to the second portion of the duodenum without difficulty or limitations.  The mucosal surfaces were surveyed very carefully during advancement of the scope and upon withdrawal.  Retroflexion view of the proximal stomach and esophagogastric junction was performed.      FINDINGS:   Salmon-colored epithelium coming up to 29 cm from the incisors and extending confluently down through the GE junction(39 cm). No esophagitis. No tumor. Stomach empty. Small hiatal hernia. Normal gastric mucosa. Patent pylorus. Normal first and second portion of the duodenum  THERAPEUTIC / DIAGNOSTIC MANEUVERS PERFORMED: Segmental biopsies from 29-39 cm from the incisors taken.   COMPLICATIONS:  None  IMPRESSION:   Barrett's esophagus-status post segmental biopsies. Hiatal hernia.  RECOMMENDATIONS:    Continue daily omeprazole. Followup on path pathology. See colonoscopy report.    _______________________________ R. Roetta Sessions, MD FACP [facility] eSigned:  R. Roetta Sessions,  MD FACP [facility] 03/22/2013 10:05 AM     CC:

## 2013-03-23 ENCOUNTER — Encounter: Payer: Self-pay | Admitting: Internal Medicine

## 2013-03-26 ENCOUNTER — Encounter (HOSPITAL_COMMUNITY): Payer: Self-pay | Admitting: Internal Medicine

## 2013-05-07 ENCOUNTER — Encounter (HOSPITAL_COMMUNITY): Payer: Self-pay | Admitting: Emergency Medicine

## 2013-05-07 ENCOUNTER — Emergency Department (HOSPITAL_COMMUNITY)
Admission: EM | Admit: 2013-05-07 | Discharge: 2013-05-07 | Disposition: A | Discharge status: Home / Self Care | Payer: BC Managed Care – PPO | Attending: Emergency Medicine | Admitting: Emergency Medicine

## 2013-05-07 ENCOUNTER — Emergency Department (HOSPITAL_COMMUNITY): Payer: BC Managed Care – PPO

## 2013-05-07 DIAGNOSIS — E785 Hyperlipidemia, unspecified: Secondary | ICD-10-CM | POA: Insufficient documentation

## 2013-05-07 DIAGNOSIS — R079 Chest pain, unspecified: Secondary | ICD-10-CM

## 2013-05-07 DIAGNOSIS — Z860100 Personal history of colon polyps, unspecified: Secondary | ICD-10-CM | POA: Insufficient documentation

## 2013-05-07 DIAGNOSIS — R0789 Other chest pain: Secondary | ICD-10-CM | POA: Insufficient documentation

## 2013-05-07 DIAGNOSIS — K219 Gastro-esophageal reflux disease without esophagitis: Secondary | ICD-10-CM | POA: Insufficient documentation

## 2013-05-07 DIAGNOSIS — M109 Gout, unspecified: Secondary | ICD-10-CM | POA: Insufficient documentation

## 2013-05-07 DIAGNOSIS — I10 Essential (primary) hypertension: Secondary | ICD-10-CM

## 2013-05-07 DIAGNOSIS — Z7982 Long term (current) use of aspirin: Secondary | ICD-10-CM | POA: Insufficient documentation

## 2013-05-07 DIAGNOSIS — G4730 Sleep apnea, unspecified: Secondary | ICD-10-CM | POA: Insufficient documentation

## 2013-05-07 DIAGNOSIS — Z79899 Other long term (current) drug therapy: Secondary | ICD-10-CM | POA: Insufficient documentation

## 2013-05-07 LAB — CBC WITH DIFFERENTIAL/PLATELET
Basophils Absolute: 0 10*3/uL (ref 0.0–0.1)
Basophils Relative: 0 % (ref 0–1)
Hemoglobin: 16 g/dL (ref 13.0–17.0)
Lymphocytes Relative: 33 % (ref 12–46)
MCHC: 34.8 g/dL (ref 30.0–36.0)
Monocytes Relative: 8 % (ref 3–12)
Neutro Abs: 4 10*3/uL (ref 1.7–7.7)
Neutrophils Relative %: 58 % (ref 43–77)
RDW: 13.4 % (ref 11.5–15.5)
WBC: 6.9 10*3/uL (ref 4.0–10.5)

## 2013-05-07 LAB — BASIC METABOLIC PANEL
BUN: 15 mg/dL (ref 6–23)
GFR calc Af Amer: 79 mL/min — ABNORMAL LOW (ref >90)
GFR calc non Af Amer: 68 mL/min — ABNORMAL LOW (ref >90)
Potassium: 4.3 mEq/L (ref 3.5–5.1)

## 2013-05-07 MED ORDER — ASPIRIN 81 MG PO CHEW
324.0000 mg | CHEWABLE_TABLET | Freq: Once | ORAL | Status: AC
  Administered 2013-05-07: 324 mg via ORAL
  Filled 2013-05-07: qty 4

## 2013-05-07 NOTE — ED Provider Notes (Signed)
CSN: [id number]     Arrival date & time 05/07/13  1205 History  This chart was scribed for Joya Gaskins, MD by Bennett Scrape, ED Scribe. This patient was seen in room APA01/APA01 and the patient's care was started at 1:09 PM.   Chief Complaint  Patient presents with  . Chest Pain    Patient is a 67 y.o. male presenting with chest pain. The history is provided by the patient. No language interpreter was used.  Chest Pain Pain location:  L chest Pain quality: pressure   Pain radiates to:  Does not radiate Pain radiates to the back: no   Timing:  Constant Progression:  Resolved Chronicity:  Recurrent Context: movement   Context comment:  Walking up stairs Relieved by:  Nothing Worsened by:  Movement (turning head) Ineffective treatments:  None tried Associated symptoms: no diaphoresis, no dizziness, no nausea, no shortness of breath, no syncope, not vomiting and no weakness   Risk factors: high cholesterol and hypertension   Risk factors: no prior DVT/PE     HPI Comments: Bradley [name] is a 67 y.o. male who presents to the Emergency Department complaining of CP. Pt states that he was working in a basement and had started coming up the stairs when he developed CPs today. The pain was located in the left chest and described as pressure like. He denies any pain with deep breathing and states that the pain was aggravated with movement, especially turning his head. He reports that the pain has been gradually resolving since coming to the ED. He denies having pain currently. He denies any fevers, emesis, weakness (told nurse he had weakness, denied it to me), dizziness, diaphoresis, SOB, diarrhea and hematochezia as associated symptoms. He admits that he had a similar mild episode of CP described as tightness while out at a Halloween attraction 2 days ago but states that it resolved. He denies that he was doing anything strenuous reporting that he was standing in line talking to his  granddaughter at the time. He states that he has been seen for the same before and was told that it was stress. He has had a prior negative stress test 6 years ago. No recent Cardiologist appointments.  He denies having a h/o DVT or PE. He states that he took on 81 mg ASA last night due to elevated BP. He reports that he has been having uncontrolled HTN for the past month. He states that he took his BP around 170/100s this morning. [facility] and had his lisinopril increased from 10 mg to 20 mg.   PCP is Dr. Lilyan Punt No current Cardiologist. Has f/u with [facility] in the past.   Past Medical History  Diagnosis Date  . Hypertension   . Hyperlipidemia   . GERD (gastroesophageal reflux disease)   . Barrett esophagus     History of  . Gout   . Sleep apnea     Noncompliant with CPAP  . History of adenomatous polyp of colon    Past Surgical History  Procedure Laterality Date  . Colonoscopy    . Tonsillectomy    . Esophagogastroduodenoscopy  07/01/2003    WUJ:WJXBJY colored epithelium involving essentially nearly the distal onehalf of the tubular esophagus/Patulous esophagogastric junction/ Moderate sized hiatal hernia/Otherwise normal stomach, normal D1 and D2.  . Esophagogastroduodenoscopy  12/09/2004    NWG:NFAOZH-YQMVHQI epithelium  beginning at 33 cm consistent with previous diagnosis of Barrett's esophagus  segmentally biopsied. Hiatal hernia. Otherwise normal stomach. Normal D1 and D2  . Colonoscopy  12/09/2004    AVW:UJWJXBJYNW polyp at 35 cm biopsied/normal rectum  . Esophagogastroduodenoscopy   05/20/2008    GNF:AOZHYQ-MVHQION epithelium distal esophagus consistent with Barrett's esophagus.  No significant change from prior exam. Multiple biopsies negative for dysplasia.  . Colonoscopy   05/20/2008    GEX:BMWUX midsigmoid s/p snare removal/normal rectum. Tubular adenoma  . Colonoscopy with esophagogastroduodenoscopy (egd) N/A 03/22/2013    Procedure:  COLONOSCOPY WITH ESOPHAGOGASTRODUODENOSCOPY (EGD);  Surgeon: Corbin Ade, MD;  Location: [facility];  Service: Endoscopy;  Laterality: N/A;  9:30   Family History  Problem Relation Age of Onset  . Heart attack Father 23    First MI in 68s  . Heart attack Brother 50  . Colon cancer Mother     age greater than 75s   History  Substance Use Topics  . Smoking status: Never Smoker   . Smokeless tobacco: Not on file     Comment: Increased exposure to second hand smoke  . Alcohol Use: No    Review of Systems  Constitutional: Negative for diaphoresis.  Respiratory: Negative for shortness of breath.   Cardiovascular: Positive for chest pain. Negative for syncope.  Gastrointestinal: Negative for nausea and vomiting.  Neurological: Negative for dizziness and weakness.  All other systems reviewed and are negative.    Allergies  Review of patient's allergies indicates no known allergies.  Home Medications   Current Outpatient Rx  Name  Route  Sig  Dispense  Refill  . allopurinol (ZYLOPRIM) 100 MG tablet   Oral   Take 100 mg by mouth daily.         Marland Kitchen ALPRAZolam (XANAX) 1 MG tablet   Oral   Take 1 mg by mouth 2 times daily at 12 noon and 4 pm.          . aspirin EC 81 MG tablet   Oral   Take 81 mg by mouth daily.         Marland Kitchen lisinopril (PRINIVIL,ZESTRIL) 10 MG tablet   Oral   Take 20 mg by mouth daily.          Marland Kitchen omeprazole (PRILOSEC) 20 MG capsule   Oral   Take 20 mg by mouth daily.         . pravastatin (PRAVACHOL) 40 MG tablet   Oral   Take 80 mg by mouth daily.            Triage Vitals: BP 150/103  Pulse 83  Temp(Src) 98.4 F (36.9 C)  Resp 17  Ht 5\' 4"  (1.626 m)  Wt 220 lb (99.791 kg)  BMI 37.74 kg/m2  SpO2 96% BP 155/95  Pulse 76  Temp(Src) 98.4 F (36.9 C)  Resp 18  Ht 5\' 4"  (1.626 m)  Wt 220 lb (99.791 kg)  BMI 37.74 kg/m2  SpO2 100%   Physical Exam  Nursing note and vitals reviewed.  CONSTITUTIONAL: Well developed/well  nourished HEAD: Normocephalic/atraumatic EYES: EOMI/PERRL ENMT: Mucous membranes moist NECK: supple no meningeal signs SPINE:entire spine nontender CV: S1/S2 noted, no murmurs/rubs/gallops noted LUNGS: Lungs are clear to auscultation bilaterally, no apparent distress ABDOMEN: soft, nontender, no rebound or guarding GU:no cva tenderness NEURO: Pt is awake/alert, moves all extremitiesx4 EXTREMITIES: pulses normal, full ROM, no calf tenderness, no LE edema SKIN: warm, color normal PSYCH: no abnormalities of mood noted  ED Course  Procedures  Medications  aspirin chewable tablet 324 mg (324  mg Oral Given 05/07/13 1318)    DIAGNOSTIC STUDIES: Oxygen Saturation is 96% on room air, normal by my interpretation.    COORDINATION OF CARE: 1:15 PM-Discussed treatment plan which includes ASA, CXR, CBC panel, BMP and troponin with pt at bedside and pt agreed to plan.   1:54 PM Pt had negative stress test in 2010 Currently his HEART score is 3 He is in no distress and CP free at this time    Pt felt improved and been pain free in the ED I advised need for continued monitoring, and will at least need repeat EKG/troponin He reports he would like to be discharged and f/u as outpatient I advised that there are limitations in single ekg/troponin testing and that ACS has not been fully ruled out He understands this and will accept risks I doubt PE/Dissection at this time Outpatient cardiology followup this week has been arranged  Labs Review Labs Reviewed  BASIC METABOLIC PANEL  CBC WITH DIFFERENTIAL  TROPONIN I   Imaging Review No results found.  EKG Interpretation     Ventricular Rate:  80 PR Interval:  184 QRS Duration: 80 QT Interval:  358 QTC Calculation: 412 R Axis:   -4 Text Interpretation:  Normal sinus rhythm Anterior infarct , age undetermined Abnormal ECG When compared with ECG of 07-Aug-2012 08:53, Premature ventricular complexes are no longer Present             MDM  No diagnosis found. Nursing notes including past medical history and social history reviewed and considered in documentation Labs/vital reviewed and considered xrays reviewed and considered Previous records reviewed and considered    I personally performed the services described in this documentation, which was scribed in my presence. The recorded information has been reviewed and is accurate.      Joya Gaskins, MD 05/07/13 2219

## 2013-05-07 NOTE — ED Notes (Signed)
Pt c/o onset of chest tightness with weakness this am, Pt also reports an episode Sat night. No nausea/vomiting, no diaphoresis.

## 2013-05-09 ENCOUNTER — Encounter: Payer: BC Managed Care – PPO | Admitting: Physician Assistant

## 2013-05-15 ENCOUNTER — Ambulatory Visit: Payer: BC Managed Care – PPO | Admitting: Family Medicine

## 2014-01-04 ENCOUNTER — Telehealth: Payer: Self-pay | Admitting: Family Medicine

## 2014-01-04 DIAGNOSIS — Z79899 Other long term (current) drug therapy: Secondary | ICD-10-CM

## 2014-01-04 DIAGNOSIS — Z125 Encounter for screening for malignant neoplasm of prostate: Secondary | ICD-10-CM

## 2014-01-04 DIAGNOSIS — E782 Mixed hyperlipidemia: Secondary | ICD-10-CM

## 2014-01-04 NOTE — Addendum Note (Signed)
Addended by: Carmelina Noun on: 01/04/2014 04:43 PM   Modules accepted: Orders

## 2014-01-04 NOTE — Telephone Encounter (Signed)
bloodwork orders put in. Called pt to let know they are ready.  Pt states he had bloodwork at the [place] and he will have them fax over the bloodwork done there to see if he needs anything additional. bloodwork orders put in today canceled because he is unsure of what was ordered at [place]. He is calling them today to have a copy faxed to [place].

## 2014-01-04 NOTE — Telephone Encounter (Signed)
Lip/liv/met 7/psa 

## 2014-01-04 NOTE — Telephone Encounter (Signed)
Last seen 02/12/13 for abd pain. Has not had bloodwork since epic. Do you need chart.

## 2014-01-04 NOTE — Telephone Encounter (Signed)
Chart sent back already

## 2014-01-04 NOTE — Telephone Encounter (Signed)
bw orders please for PE on 7/15  Call when ready

## 2014-01-11 ENCOUNTER — Telehealth: Payer: Self-pay | Admitting: Family Medicine

## 2014-01-11 NOTE — Telephone Encounter (Signed)
Please review patients labwork in your red folder.

## 2014-01-17 ENCOUNTER — Telehealth: Payer: Self-pay | Admitting: Family Medicine

## 2014-01-17 NOTE — Telephone Encounter (Signed)
Review labs from [facility] patient has appointment on 7/15.

## 2014-01-22 NOTE — Telephone Encounter (Signed)
Labs are reviewed. Please keep copy with the chart. May scan the original.

## 2014-01-30 ENCOUNTER — Encounter: Payer: Self-pay | Admitting: Family Medicine

## 2014-01-30 ENCOUNTER — Ambulatory Visit (INDEPENDENT_AMBULATORY_CARE_PROVIDER_SITE_OTHER): Payer: Medicare Other | Admitting: Family Medicine

## 2014-01-30 VITALS — BP 122/86 | Ht 64.0 in | Wt 226.2 lb

## 2014-01-30 DIAGNOSIS — Z23 Encounter for immunization: Secondary | ICD-10-CM

## 2014-01-30 DIAGNOSIS — D510 Vitamin B12 deficiency anemia due to intrinsic factor deficiency: Secondary | ICD-10-CM

## 2014-01-30 DIAGNOSIS — Z0000 Encounter for general adult medical examination without abnormal findings: Secondary | ICD-10-CM

## 2014-01-30 NOTE — Progress Notes (Signed)
   Subjective:    Patient ID: Bradley [name], male    DOB: 1946-02-12, 68 y.o.   MRN: [id number]  HPI AWV- Annual Wellness Visit  The patient was seen for their annual wellness visit. The patient's past medical history, surgical history, and family history were reviewed. Pertinent vaccines were reviewed ( tetanus, pneumonia, shingles, flu) The patient's medication list was reviewed and updated.  The height and weight were entered. The patient's current BMI is:38.8  Cognitive screening was completed. Outcome of Mini - Cog: pass  Falls within the past 6 months: no  Current tobacco usage: no (All patients who use tobacco were given written and verbal information on quitting)  Recent listing of emergency department/hospitalizations over the past year were reviewed.  current specialist the patient sees on a regular basis: no   Patient reports not having as much energy as he used to when he was younger.  Medicare annual wellness visit patient questionnaire was reviewed.  A written screening schedule for the patient for the next 5-10 years was given. Appropriate discussion of followup regarding next visit was discussed. Patient admits that he does not exercise. He does not use CPAP machine either. He states occasional basis but denies feeling like his fall asleep when he drives    Review of Systems  Constitutional: Negative for fever, activity change and appetite change.  HENT: Negative for congestion and rhinorrhea.   Eyes: Negative for discharge.  Respiratory: Negative for cough and wheezing.   Cardiovascular: Negative for chest pain.  Gastrointestinal: Negative for vomiting, abdominal pain and blood in stool.  Genitourinary: Negative for frequency and difficulty urinating.  Musculoskeletal: Negative for neck pain.  Skin: Negative for rash.  Allergic/Immunologic: Negative for environmental allergies and food allergies.  Neurological: Negative for weakness and headaches.    Psychiatric/Behavioral: Negative for agitation.       Objective:   Physical Exam  Constitutional: He appears well-developed and well-nourished.  HENT:  Head: Normocephalic and atraumatic.  Right Ear: External ear normal.  Left Ear: External ear normal.  Nose: Nose normal.  Mouth/Throat: Oropharynx is clear and moist.  Eyes: EOM are normal. Pupils are equal, round, and reactive to light.  Neck: Normal range of motion. Neck supple. No thyromegaly present.  Cardiovascular: Normal rate, regular rhythm and normal heart sounds.   No murmur heard. Pulmonary/Chest: Effort normal and breath sounds normal. No respiratory distress. He has no wheezes.  Abdominal: Soft. Bowel sounds are normal. He exhibits no distension and no mass. There is no tenderness.  Genitourinary: Penis normal.  Musculoskeletal: Normal range of motion. He exhibits no edema.  Lymphadenopathy:    He has no cervical adenopathy.  Neurological: He is alert. He exhibits normal muscle tone.  Skin: Skin is warm and dry. No erythema.  Psychiatric: He has a normal mood and affect. His behavior is normal. Judgment normal.          Assessment & Plan:  Wellness-safety measures dietary measures all discuss his labs were reviewed. Overall looked good except for low B12  Pernicious anemia recommend B12 shots 1 cc IM weekly for 4 weeks then 2000 mcg orally. He has a Marine scientist and his family he is requesting a prescription he'll get that through the [place] and they will do the shots themselves. I recommended to the patient that he recheck [facility]

## 2014-01-30 NOTE — Patient Instructions (Signed)
DASH Eating Plan  DASH stands for "Dietary Approaches to Stop Hypertension." The DASH eating plan is a healthy eating plan that has been shown to reduce high blood pressure (hypertension). Additional health benefits may include reducing the risk of type 2 diabetes mellitus, heart disease, and stroke. The DASH eating plan may also help with weight loss.  WHAT DO I NEED TO KNOW ABOUT THE DASH EATING PLAN?  For the DASH eating plan, you will follow these general guidelines:  · Choose foods with a percent daily value for sodium of less than 5% (as listed on the food label).  · Use salt-free seasonings or herbs instead of table salt or sea salt.  · Check with your health care provider or pharmacist before using salt substitutes.  · Eat lower-sodium products, often labeled as "lower sodium" or "no salt added."  · Eat fresh foods.  · Eat more vegetables, fruits, and low-fat dairy products.  · Choose whole grains. Look for the word "whole" as the first word in the ingredient list.  · Choose fish and skinless chicken or turkey more often than red meat. Limit fish, poultry, and meat to 6 oz (170 g) each day.  · Limit sweets, desserts, sugars, and sugary drinks.  · Choose heart-healthy fats.  · Limit cheese to 1 oz (28 g) per day.  · Eat more home-cooked food and less restaurant, buffet, and fast food.  · Limit fried foods.  · Cook foods using methods other than frying.  · Limit canned vegetables. If you do use them, rinse them well to decrease the sodium.  · When eating at a restaurant, ask that your food be prepared with less salt, or no salt if possible.  WHAT FOODS CAN I EAT?  Seek help from a dietitian for individual calorie needs.  Grains  Whole grain or whole wheat bread. Brown rice. Whole grain or whole wheat pasta. Quinoa, bulgur, and whole grain cereals. Low-sodium cereals. Corn or whole wheat flour tortillas. Whole grain cornbread. Whole grain crackers. Low-sodium crackers.  Vegetables  Fresh or frozen vegetables  (raw, steamed, roasted, or grilled). Low-sodium or reduced-sodium tomato and vegetable juices. Low-sodium or reduced-sodium tomato sauce and paste. Low-sodium or reduced-sodium canned vegetables.   Fruits  All fresh, canned (in natural juice), or frozen fruits.  Meat and Other Protein Products  Ground beef (85% or leaner), grass-fed beef, or beef trimmed of fat. Skinless chicken or turkey. Ground chicken or turkey. Pork trimmed of fat. All fish and seafood. Eggs. Dried beans, peas, or lentils. Unsalted nuts and seeds. Unsalted canned beans.  Dairy  Low-fat dairy products, such as skim or 1% milk, 2% or reduced-fat cheeses, low-fat ricotta or cottage cheese, or plain low-fat yogurt. Low-sodium or reduced-sodium cheeses.  Fats and Oils  Tub margarines without trans fats. Light or reduced-fat mayonnaise and salad dressings (reduced sodium). Avocado. Safflower, olive, or canola oils. Natural peanut or almond butter.  Other  Unsalted popcorn and pretzels.  The items listed above may not be a complete list of recommended foods or beverages. Contact your dietitian for more options.  WHAT FOODS ARE NOT RECOMMENDED?  Grains  White bread. White pasta. White rice. Refined cornbread. Bagels and croissants. Crackers that contain trans fat.  Vegetables  Creamed or fried vegetables. Vegetables in a cheese sauce. Regular canned vegetables. Regular canned tomato sauce and paste. Regular tomato and vegetable juices.  Fruits  Dried fruits. Canned fruit in light or heavy syrup. Fruit juice.  Meat and Other Protein   Products  Fatty cuts of meat. Ribs, chicken wings, bacon, sausage, bologna, salami, chitterlings, fatback, hot dogs, bratwurst, and packaged luncheon meats. Salted nuts and seeds. Canned beans with salt.  Dairy  Whole or 2% milk, cream, half-and-half, and cream cheese. Whole-fat or sweetened yogurt. Full-fat cheeses or blue cheese. Nondairy creamers and whipped toppings. Processed cheese, cheese spreads, or cheese  curds.  Condiments  Onion and garlic salt, seasoned salt, table salt, and sea salt. Canned and packaged gravies. Worcestershire sauce. Tartar sauce. Barbecue sauce. Teriyaki sauce. Soy sauce, including reduced sodium. Steak sauce. Fish sauce. Oyster sauce. Cocktail sauce. Horseradish. Ketchup and mustard. Meat flavorings and tenderizers. Bouillon cubes. Hot sauce. Tabasco sauce. Marinades. Taco seasonings. Relishes.  Fats and Oils  Butter, stick margarine, lard, shortening, ghee, and bacon fat. Coconut, palm kernel, or palm oils. Regular salad dressings.  Other  Pickles and olives. Salted popcorn and pretzels.  The items listed above may not be a complete list of foods and beverages to avoid. Contact your dietitian for more information.  WHERE CAN I FIND MORE INFORMATION?  National Heart, Lung, and Blood Institute: www.nhlbi.nih.gov/health/health-topics/topics/dash/  Document Released: 06/24/2011 Document Revised: 07/10/2013 Document Reviewed: 05/09/2013  ExitCare® Patient Information ©2015 [employer]. This information is not intended to replace advice given to you by your health care provider. Make sure you discuss any questions you have with your health care provider.

## 2014-02-04 ENCOUNTER — Telehealth: Payer: Self-pay | Admitting: Family Medicine

## 2014-02-04 NOTE — Telephone Encounter (Signed)
Pt wants to know if you can send a copy of the  Script for it as well to them

## 2014-02-04 NOTE — Telephone Encounter (Signed)
[facility] to see if he could [facility]  [facility]12 injections? However the script written is not enough for them to  Give him an appt without you writing them a letter telling them why He needs the B12 injections. We may fax it to them when written   [phone] fax  Attn: Butch Penny or Dr Ace Gins

## 2014-02-05 NOTE — Telephone Encounter (Signed)
A letter was dictated. Please send as requested. Scripts were also written out

## 2014-02-05 NOTE — Telephone Encounter (Signed)
Letter faxed, original mailed to pt copy sent to scan center

## 2014-02-27 ENCOUNTER — Telehealth: Payer: Self-pay | Admitting: Family Medicine

## 2014-02-27 NOTE — Telephone Encounter (Signed)
Patient wants new prescription for B 12 shot instead of pills to be faxed to [place]. He states its easier to get shot once a month than taking pills. Fax. number [phone]. Attn. Dr. Tereso Newcomer

## 2014-02-27 NOTE — Telephone Encounter (Signed)
Do script for 1cc B12 IM q month due to pernicious anemia, I'll sign then you all fax thanks

## 2014-02-28 NOTE — Telephone Encounter (Signed)
Script faxed. Pt notified on voicemail.

## 2014-09-23 ENCOUNTER — Telehealth: Payer: Self-pay | Admitting: Family Medicine

## 2014-09-23 DIAGNOSIS — M109 Gout, unspecified: Secondary | ICD-10-CM

## 2014-09-23 DIAGNOSIS — Z79899 Other long term (current) drug therapy: Secondary | ICD-10-CM

## 2014-09-23 DIAGNOSIS — Z125 Encounter for screening for malignant neoplasm of prostate: Secondary | ICD-10-CM

## 2014-09-23 DIAGNOSIS — E785 Hyperlipidemia, unspecified: Secondary | ICD-10-CM

## 2014-09-23 NOTE — Telephone Encounter (Signed)
Lip/liv/met 7 / psa/uric acid

## 2014-09-23 NOTE — Telephone Encounter (Signed)
Patient has physical  schedule for 4/4 needing blood work paper.

## 2014-09-23 NOTE — Telephone Encounter (Signed)
No screening labs in Epic

## 2014-09-24 NOTE — Addendum Note (Signed)
Addended by: Jesusita Oka on: 09/24/2014 10:08 AM   Modules accepted: Orders

## 2014-09-24 NOTE — Telephone Encounter (Signed)
Left message on voicemail notifying patient that bloodwork has been ordered and to report to [facility] for blood draw.

## 2014-10-21 ENCOUNTER — Encounter: Payer: Self-pay | Admitting: Family Medicine

## 2014-10-21 ENCOUNTER — Ambulatory Visit (INDEPENDENT_AMBULATORY_CARE_PROVIDER_SITE_OTHER): Payer: Medicare Other | Admitting: Family Medicine

## 2014-10-21 VITALS — BP 130/88 | Ht 64.0 in | Wt 223.0 lb

## 2014-10-21 DIAGNOSIS — Z0000 Encounter for general adult medical examination without abnormal findings: Secondary | ICD-10-CM

## 2014-10-21 NOTE — Patient Instructions (Addendum)
DASH Eating Plan DASH stands for "Dietary Approaches to Stop Hypertension." The DASH eating plan is a healthy eating plan that has been shown to reduce high blood pressure (hypertension). Additional health benefits may include reducing the risk of type 2 diabetes mellitus, heart disease, and stroke. The DASH eating plan may also help with weight loss. WHAT DO I NEED TO KNOW ABOUT THE DASH EATING PLAN? For the DASH eating plan, you will follow these general guidelines:  Choose foods with a percent daily value for sodium of less than 5% (as listed on the food label).  Use salt-free seasonings or herbs instead of table salt or sea salt.  Check with your health care provider or pharmacist before using salt substitutes.  Eat lower-sodium products, often labeled as "lower sodium" or "no salt added."  Eat fresh foods.  Eat more vegetables, fruits, and low-fat dairy products.  Choose whole grains. Look for the word "whole" as the first word in the ingredient list.  Choose fish and skinless chicken or turkey more often than red meat. Limit fish, poultry, and meat to 6 oz (170 g) each day.  Limit sweets, desserts, sugars, and sugary drinks.  Choose heart-healthy fats.  Limit cheese to 1 oz (28 g) per day.  Eat more home-cooked food and less restaurant, buffet, and fast food.  Limit fried foods.  Cook foods using methods other than frying.  Limit canned vegetables. If you do use them, rinse them well to decrease the sodium.  When eating at a restaurant, ask that your food be prepared with less salt, or no salt if possible. WHAT FOODS CAN I EAT? Seek help from a dietitian for individual calorie needs. Grains Whole grain or whole wheat bread. Brown rice. Whole grain or whole wheat pasta. Quinoa, bulgur, and whole grain cereals. Low-sodium cereals. Corn or whole wheat flour tortillas. Whole grain cornbread. Whole grain crackers. Low-sodium crackers. Vegetables Fresh or frozen vegetables  (raw, steamed, roasted, or grilled). Low-sodium or reduced-sodium tomato and vegetable juices. Low-sodium or reduced-sodium tomato sauce and paste. Low-sodium or reduced-sodium canned vegetables.  Fruits All fresh, canned (in natural juice), or frozen fruits. Meat and Other Protein Products Ground beef (85% or leaner), grass-fed beef, or beef trimmed of fat. Skinless chicken or turkey. Ground chicken or turkey. Pork trimmed of fat. All fish and seafood. Eggs. Dried beans, peas, or lentils. Unsalted nuts and seeds. Unsalted canned beans. Dairy Low-fat dairy products, such as skim or 1% milk, 2% or reduced-fat cheeses, low-fat ricotta or cottage cheese, or plain low-fat yogurt. Low-sodium or reduced-sodium cheeses. Fats and Oils Tub margarines without trans fats. Light or reduced-fat mayonnaise and salad dressings (reduced sodium). Avocado. Safflower, olive, or canola oils. Natural peanut or almond butter. Other Unsalted popcorn and pretzels. The items listed above may not be a complete list of recommended foods or beverages. Contact your dietitian for more options. WHAT FOODS ARE NOT RECOMMENDED? Grains White bread. White pasta. White rice. Refined cornbread. Bagels and croissants. Crackers that contain trans fat. Vegetables Creamed or fried vegetables. Vegetables in a cheese sauce. Regular canned vegetables. Regular canned tomato sauce and paste. Regular tomato and vegetable juices. Fruits Dried fruits. Canned fruit in light or heavy syrup. Fruit juice. Meat and Other Protein Products Fatty cuts of meat. Ribs, chicken wings, bacon, sausage, bologna, salami, chitterlings, fatback, hot dogs, bratwurst, and packaged luncheon meats. Salted nuts and seeds. Canned beans with salt. Dairy Whole or 2% milk, cream, half-and-half, and cream cheese. Whole-fat or sweetened yogurt. Full-fat   cheeses or blue cheese. Nondairy creamers and whipped toppings. Processed cheese, cheese spreads, or cheese  curds. Condiments Onion and garlic salt, seasoned salt, table salt, and sea salt. Canned and packaged gravies. Worcestershire sauce. Tartar sauce. Barbecue sauce. Teriyaki sauce. Soy sauce, including reduced sodium. Steak sauce. Fish sauce. Oyster sauce. Cocktail sauce. Horseradish. Ketchup and mustard. Meat flavorings and tenderizers. Bouillon cubes. Hot sauce. Tabasco sauce. Marinades. Taco seasonings. Relishes. Fats and Oils Butter, stick margarine, lard, shortening, ghee, and bacon fat. Coconut, palm kernel, or palm oils. Regular salad dressings. Other Pickles and olives. Salted popcorn and pretzels. The items listed above may not be a complete list of foods and beverages to avoid. Contact your dietitian for more information. WHERE CAN I FIND MORE INFORMATION? National Heart, Lung, and Blood Institute: www.nhlbi.nih.gov/health/health-topics/topics/dash/ Document Released: 06/24/2011 Document Revised: 11/19/2013 Document Reviewed: 05/09/2013 ExitCare Patient Information 2015 [employer]. This information is not intended to replace advice given to you by your health care provider. Make sure you discuss any questions you have with your health care provider. Hypertension Hypertension, commonly called high blood pressure, is when the force of blood pumping through your arteries is too strong. Your arteries are the blood vessels that carry blood from your heart throughout your body. A blood pressure reading consists of a higher number over a lower number, such as 110/72. The higher number (systolic) is the pressure inside your arteries when your heart pumps. The lower number (diastolic) is the pressure inside your arteries when your heart relaxes. Ideally you want your blood pressure below 120/80. Hypertension forces your heart to work harder to pump blood. Your arteries may become narrow or stiff. Having hypertension puts you at risk for heart disease, stroke, and other problems.  RISK  FACTORS Some risk factors for high blood pressure are controllable. Others are not.  Risk factors you cannot control include:   Race. You may be at higher risk if you are African American.  Age. Risk increases with age.  Gender. Men are at higher risk than women before age 45 years. After age 65, women are at higher risk than men. Risk factors you can control include:  Not getting enough exercise or physical activity.  Being overweight.  Getting too much fat, sugar, calories, or salt in your diet.  Drinking too much alcohol. SIGNS AND SYMPTOMS Hypertension does not usually cause signs or symptoms. Extremely high blood pressure (hypertensive crisis) may cause headache, anxiety, shortness of breath, and nosebleed. DIAGNOSIS  To check if you have hypertension, your health care provider will measure your blood pressure while you are seated, with your arm held at the level of your heart. It should be measured at least twice using the same arm. Certain conditions can cause a difference in blood pressure between your right and left arms. A blood pressure reading that is higher than normal on one occasion does not mean that you need treatment. If one blood pressure reading is high, ask your health care provider about having it checked again. TREATMENT  Treating high blood pressure includes making lifestyle changes and possibly taking medicine. Living a healthy lifestyle can help lower high blood pressure. You may need to change some of your habits. Lifestyle changes may include:  Following the DASH diet. This diet is high in fruits, vegetables, and whole grains. It is low in salt, red meat, and added sugars.  Getting at least 2 hours of brisk physical activity every week.  Losing weight if necessary.  Not smoking.  Limiting   alcoholic beverages.  Learning ways to reduce stress. If lifestyle changes are not enough to get your blood pressure under control, your health care provider may  prescribe medicine. You may need to take more than one. Work closely with your health care provider to understand the risks and benefits. HOME CARE INSTRUCTIONS  Have your blood pressure rechecked as directed by your health care provider.   Take medicines only as directed by your health care provider. Follow the directions carefully. Blood pressure medicines must be taken as prescribed. The medicine does not work as well when you skip doses. Skipping doses also puts you at risk for problems.   Do not smoke.   Monitor your blood pressure at home as directed by your health care provider. SEEK MEDICAL CARE IF:   You think you are having a reaction to medicines taken.  You have recurrent headaches or feel dizzy.  You have swelling in your ankles.  You have trouble with your vision. SEEK IMMEDIATE MEDICAL CARE IF:  You develop a severe headache or confusion.  You have unusual weakness, numbness, or feel faint.  You have severe chest or abdominal pain.  You vomit repeatedly.  You have trouble breathing. MAKE SURE YOU:   Understand these instructions.  Will watch your condition.  Will get help right away if you are not doing well or get worse. Document Released: 07/05/2005 Document Revised: 11/19/2013 Document Reviewed: 04/27/2013 ExitCare Patient Information 2015 [employer], [employer]. This information is not intended to replace advice given to you by your health care provider. Make sure you discuss any questions you have with your health care provider.  

## 2014-10-21 NOTE — Progress Notes (Signed)
   Subjective:    Patient ID: Bradley [name], male    DOB: 05/24/1946, 69 y.o.   MRN: [id number]  HPI AWV- Annual Wellness Visit  The patient was seen for their annual wellness visit. The patient's past medical history, surgical history, and family history were reviewed. Pertinent vaccines were reviewed ( tetanus, pneumonia, shingles, flu) The patient's medication list was reviewed and updated.  The height and weight were entered. The patient's current BMI is: 29  Cognitive screening was completed. Outcome of Mini - Cog: Pass Falls within the past 6 months:No  Current tobacco usage: No (All patients who use tobacco were given written and verbal information on quitting)  Recent listing of emergency department/hospitalizations over the past year were reviewed. current specialist the patient sees on a regular basis: No  Low back pain 2 weeks ago.  Hypo pigmentation on head and arms.    Medicare annual wellness visit patient questionnaire was reviewed.  A written screening schedule for the patient for the next 5-10 years was given. Appropriate discussion of followup regarding next visit was discussed.       Review of Systems  Constitutional: Negative for activity change, appetite change and fatigue.  HENT: Negative for congestion.   Respiratory: Negative for cough.   Cardiovascular: Negative for chest pain.  Gastrointestinal: Negative for abdominal pain.  Endocrine: Negative for polydipsia and polyphagia.  Neurological: Negative for weakness.  Psychiatric/Behavioral: Negative for confusion.       Objective:   Physical Exam  Constitutional: He appears well-nourished. No distress.  Cardiovascular: Normal rate, regular rhythm and normal heart sounds.   No murmur heard. Pulmonary/Chest: Effort normal and breath sounds normal. No respiratory distress.  Genitourinary: Prostate normal.  Musculoskeletal: He exhibits no edema.  Lymphadenopathy:    He has no cervical  adenopathy.  Neurological: He is alert.  Psychiatric: His behavior is normal.  Vitals reviewed.    Patient sees a [place] couple times year for health related maintenance of his chronic health issues     Assessment & Plan:  Patient up-to-date on colonoscopy next 1 2019 Up-to-date on immunizations Medications are through the [place] He will get lab work done and send [place] a results Safety measures dietary measures discussed Try to lose 15-20 pounds over the next year Watch diet increase physical activity Recheck if progressive troubles Follow-up in one year

## 2015-04-14 ENCOUNTER — Ambulatory Visit (INDEPENDENT_AMBULATORY_CARE_PROVIDER_SITE_OTHER): Payer: Medicare Other | Admitting: Family Medicine

## 2015-04-14 VITALS — BP 120/78 | Ht 64.0 in | Wt 224.1 lb

## 2015-04-14 DIAGNOSIS — S060X1D Concussion with loss of consciousness of 30 minutes or less, subsequent encounter: Secondary | ICD-10-CM | POA: Diagnosis not present

## 2015-04-14 NOTE — Progress Notes (Signed)
   Subjective:    Patient ID: Bradley [name], male    DOB: 04-Dec-1945, 69 y.o.   MRN: [id number]  Fall The accident occurred 2 days ago. The fall occurred from a ladder. He fell from a height of 6 to 10 ft. He landed on concrete. The point of impact was the head. The pain is present in the head. The pain is moderate. Pertinent negatives include no abdominal pain, fever or headaches.   Patient did have brief LOC. Had CAT scan at ER was negative. X-rays of neck negative. Patient states no other concerns this visit.  Review of Systems  Constitutional: Negative for fever and fatigue.  Respiratory: Negative for cough.   Gastrointestinal: Negative for abdominal pain.  Musculoskeletal: Positive for back pain.  Neurological: Negative for dizziness, light-headedness and headaches.   Patient denies nausea vomiting denies severe headaches relate some body aches.    Objective:   Physical Exam  Abrasion on the back of the head EOMI finger to nose normal Romberg negative lungs clear heart regular  Patient also stressed his brother dying of prostate cancer, the patient up-to-date on PSA he does get yearly wellness exams here and at the [place]:  Head contusion-no sign of any permanent damage. Avoid any falls records from [facility] ER were requested.

## 2015-10-27 ENCOUNTER — Encounter: Payer: Self-pay | Admitting: Family Medicine

## 2015-10-27 ENCOUNTER — Ambulatory Visit (INDEPENDENT_AMBULATORY_CARE_PROVIDER_SITE_OTHER): Payer: Medicare HMO | Admitting: Family Medicine

## 2015-10-27 VITALS — BP 132/86 | Ht 64.0 in | Wt 217.4 lb

## 2015-10-27 DIAGNOSIS — Z0000 Encounter for general adult medical examination without abnormal findings: Secondary | ICD-10-CM | POA: Diagnosis not present

## 2015-10-27 DIAGNOSIS — E669 Obesity, unspecified: Secondary | ICD-10-CM

## 2015-10-27 NOTE — Progress Notes (Signed)
   Subjective:    Patient ID: Bradley [name], male    DOB: [birth year], 70 y.o.   MRN: [id number]  HPI AWV- Annual Wellness Visit  The patient was seen for their annual wellness visit. The patient's past medical history, surgical history, and family history were reviewed. Pertinent vaccines were reviewed ( tetanus, pneumonia, shingles, flu) The patient's medication list was reviewed and updated.  The height and weight were entered. The patient's current BMI is:37  Cognitive screening was completed. Outcome of Mini - Cog: Pass  Falls within the past 6 months: None  Current tobacco usage: None (All patients who use tobacco were given written and verbal information on quitting)  Recent listing of emergency department/hospitalizations over the past year were reviewed.  current specialist the patient sees on a regular basis: None   Medicare annual wellness visit patient questionnaire was reviewed.  A written screening schedule for the patient for the next 5-10 years was given. Appropriate discussion of followup regarding next visit was discussed.      Review of Systems  Constitutional: Negative for fever, activity change and appetite change.  HENT: Negative for congestion and rhinorrhea.   Eyes: Negative for discharge.  Respiratory: Negative for cough and wheezing.   Cardiovascular: Negative for chest pain.  Gastrointestinal: Negative for vomiting, abdominal pain and blood in stool.  Genitourinary: Negative for frequency and difficulty urinating.  Musculoskeletal: Negative for neck pain.  Skin: Negative for rash.  Allergic/Immunologic: Negative for environmental allergies and food allergies.  Neurological: Negative for weakness and headaches.  Psychiatric/Behavioral: Negative for agitation.       Objective:   Physical Exam  Constitutional: He appears well-developed and well-nourished.  HENT:  Head: Normocephalic and atraumatic.  Right Ear: External ear normal.  Left  Ear: External ear normal.  Nose: Nose normal.  Mouth/Throat: Oropharynx is clear and moist.  Eyes: EOM are normal. Pupils are equal, round, and reactive to light.  Neck: Normal range of motion. Neck supple. No thyromegaly present.  Cardiovascular: Normal rate, regular rhythm and normal heart sounds.   No murmur heard. Pulmonary/Chest: Effort normal and breath sounds normal. No respiratory distress. He has no wheezes.  Abdominal: Soft. Bowel sounds are normal. He exhibits no distension and no mass. There is no tenderness.  Genitourinary: Prostate normal and penis normal.  Musculoskeletal: Normal range of motion. He exhibits no edema.  Lymphadenopathy:    He has no cervical adenopathy.  Neurological: He is alert. He exhibits normal muscle tone.  Skin: Skin is warm and dry. No erythema.  Psychiatric: He has a normal mood and affect. His behavior is normal. Judgment normal.          Assessment & Plan:  Wellness-safety dietary measures all discussed. Importance of healthy eating trying to lose weight. Patient up-to-date on colonoscopy. He gets all of his medicines through the [place] and they do all of his lab work there.He states he will have lab work sent to  Patient is under some stress cause brother has metastatic prostate cancer await this gentleman's PSA his prostate was normal  Weight loss reduction was discussed

## 2016-02-19 ENCOUNTER — Encounter: Payer: Self-pay | Admitting: Internal Medicine

## 2016-05-27 DIAGNOSIS — D225 Melanocytic nevi of trunk: Secondary | ICD-10-CM | POA: Diagnosis not present

## 2016-05-27 DIAGNOSIS — L570 Actinic keratosis: Secondary | ICD-10-CM | POA: Diagnosis not present

## 2016-05-27 DIAGNOSIS — L820 Inflamed seborrheic keratosis: Secondary | ICD-10-CM | POA: Diagnosis not present

## 2016-05-27 DIAGNOSIS — X32XXXD Exposure to sunlight, subsequent encounter: Secondary | ICD-10-CM | POA: Diagnosis not present

## 2016-05-27 DIAGNOSIS — Z1283 Encounter for screening for malignant neoplasm of skin: Secondary | ICD-10-CM | POA: Diagnosis not present

## 2016-06-17 ENCOUNTER — Ambulatory Visit (INDEPENDENT_AMBULATORY_CARE_PROVIDER_SITE_OTHER): Payer: Medicare HMO | Admitting: Family Medicine

## 2016-06-17 ENCOUNTER — Encounter: Payer: Self-pay | Admitting: Family Medicine

## 2016-06-17 VITALS — BP 122/84 | Temp 98.6°F | Ht 64.0 in | Wt 224.0 lb

## 2016-06-17 DIAGNOSIS — Z79899 Other long term (current) drug therapy: Secondary | ICD-10-CM

## 2016-06-17 DIAGNOSIS — R29810 Facial weakness: Secondary | ICD-10-CM | POA: Diagnosis not present

## 2016-06-17 DIAGNOSIS — E785 Hyperlipidemia, unspecified: Secondary | ICD-10-CM | POA: Diagnosis not present

## 2016-06-17 DIAGNOSIS — H02401 Unspecified ptosis of right eyelid: Secondary | ICD-10-CM | POA: Diagnosis not present

## 2016-06-17 NOTE — Progress Notes (Signed)
   Subjective:    Patient ID: Bradley [name], male    DOB: 09-Dec-1945, 70 y.o.   MRN: [id number]  HPIright eye dilated. Trouble seeing at night. Light bothers eye.   Tingling in lips and jaw locking up.   Had shingles years ago on his head and has pain in jaw and head every winter.   Tingling in right arm. This patient had onset of mild confusion as well as mild headache and visual disturbance that occurred at the end of October. The severity of the visual disturbance was for at least a couple hours and the headache was off and on for a short period of time the mental confusion glasses several hours and the patient relates since then he is been having trouble seeing at night he also relates bright light bothers him he relates right eye ptosis in addition to this has tingling on the right side of his face and also into the arm.  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 Denies any unilateral numbness or weakness except for what is detailed above     Objective:   Physical Exam He does have ptosis on the right side. Pupil appears to be slightly dilated. Extremities no edema skin warm dry subjective numbness around the right side and lower part of the face as well as in the upper portion of the right shoulder in addition to this some subjective numbness into the arm Apparently this ptosis is been going on for at least a month since this event occurred at the end of October finger to nose is normal. Romberg he does waver but is normal. Patient is able to walk in the hallway [place] and walk back without difficulty.      Assessment & Plan:  Patient with weakness on the right side of his face as well as tingling into the right arm and also ptosis on the right eyelid along with neurologic event that occurred several weeks ago I recommend MRI/MRA of the brain. Await the results  of this. Referral to neurology as well. Greater than 25 minutes spent in discussion evaluation and treatment of this issue  Lipid profile along with metabolic 7 ordered the importance of reducing risk factors including blood pressure cholesterol sugar were discussed in detail  MRIs necessary in order to help rule out a stroke also helps rule out the possibility of aneurysm. I doubt a carotid artery dissection.  Referral to neurology recommended. We will set up in [place].

## 2016-06-18 ENCOUNTER — Telehealth: Payer: Self-pay | Admitting: Family Medicine

## 2016-06-18 ENCOUNTER — Encounter: Payer: Self-pay | Admitting: *Deleted

## 2016-06-18 DIAGNOSIS — Z79899 Other long term (current) drug therapy: Secondary | ICD-10-CM | POA: Diagnosis not present

## 2016-06-18 DIAGNOSIS — E785 Hyperlipidemia, unspecified: Secondary | ICD-10-CM | POA: Diagnosis not present

## 2016-06-18 NOTE — Telephone Encounter (Signed)
Added to med list

## 2016-06-18 NOTE — Telephone Encounter (Signed)
Pt stated that he also takes a b12 shot at home once a month. Pt forgot to mention that to Dr Nicki Reaper yesterday at his appt.

## 2016-06-18 NOTE — Telephone Encounter (Signed)
MRA/ is scheduled for 06/23/16 must know which procedure to precert before Tuesday [id number] at 2:00 pm or test will be canceled.

## 2016-06-18 NOTE — Telephone Encounter (Signed)
Bradley [name] scheduling called in regards to the MR MRA head w/o contrast. They were questioning why a MRI of the brain was not ordered. Please advise?

## 2016-06-19 LAB — BASIC METABOLIC PANEL
BUN / CREAT RATIO: 15 (ref 10–24)
BUN: 17 mg/dL (ref 8–27)
CALCIUM: 9.2 mg/dL (ref 8.6–10.2)
CO2: 25 mmol/L (ref 18–29)
CREATININE: 1.15 mg/dL (ref 0.76–1.27)
Chloride: 100 mmol/L (ref 96–106)
GFR calc Af Amer: 75 mL/min/{1.73_m2} (ref >59)
GFR calc non Af Amer: 65 mL/min/{1.73_m2} (ref >59)
GLUCOSE: 103 mg/dL — AB (ref 65–99)
Potassium: 5 mmol/L (ref 3.5–5.2)
Sodium: 139 mmol/L (ref 134–144)

## 2016-06-19 LAB — LIPID PANEL
Chol/HDL Ratio: 4 ratio units (ref 0.0–5.0)
Cholesterol, Total: 131 mg/dL (ref 100–199)
HDL: 33 mg/dL — AB (ref >39)
LDL Calculated: 77 mg/dL (ref 0–99)
Triglycerides: 106 mg/dL (ref 0–149)
VLDL Cholesterol Cal: 21 mg/dL (ref 5–40)

## 2016-06-20 ENCOUNTER — Encounter: Payer: Self-pay | Admitting: Family Medicine

## 2016-06-21 ENCOUNTER — Telehealth: Payer: Self-pay | Admitting: Family Medicine

## 2016-06-21 ENCOUNTER — Other Ambulatory Visit: Payer: Self-pay

## 2016-06-21 DIAGNOSIS — H02401 Unspecified ptosis of right eyelid: Secondary | ICD-10-CM

## 2016-06-21 DIAGNOSIS — R29810 Facial weakness: Secondary | ICD-10-CM

## 2016-06-21 NOTE — Telephone Encounter (Signed)
Patient's MRI and MRI of head and brain was approved by [employer] for 06/21/16-09/19/2016. Approval number is [id number]

## 2016-06-21 NOTE — Telephone Encounter (Signed)
Clarification MRI of the brain MRA as well

## 2016-06-23 ENCOUNTER — Ambulatory Visit (HOSPITAL_COMMUNITY)
Admission: RE | Admit: 2016-06-23 | Discharge: 2016-06-23 | Disposition: A | Discharge status: Home / Self Care | Payer: Medicare HMO | Source: Ambulatory Visit | Attending: Family Medicine | Admitting: Family Medicine

## 2016-06-23 DIAGNOSIS — R29810 Facial weakness: Secondary | ICD-10-CM | POA: Diagnosis not present

## 2016-06-23 DIAGNOSIS — R9082 White matter disease, unspecified: Secondary | ICD-10-CM | POA: Insufficient documentation

## 2016-06-23 DIAGNOSIS — H02401 Unspecified ptosis of right eyelid: Secondary | ICD-10-CM | POA: Diagnosis present

## 2016-06-24 ENCOUNTER — Encounter: Payer: Self-pay | Admitting: Family Medicine

## 2016-06-24 ENCOUNTER — Other Ambulatory Visit: Payer: Self-pay

## 2016-06-24 DIAGNOSIS — R29810 Facial weakness: Secondary | ICD-10-CM

## 2016-07-15 ENCOUNTER — Ambulatory Visit (HOSPITAL_COMMUNITY)
Admission: RE | Admit: 2016-07-15 | Discharge: 2016-07-15 | Disposition: A | Discharge status: Home / Self Care | Payer: Medicare HMO | Source: Ambulatory Visit | Attending: Family Medicine | Admitting: Family Medicine

## 2016-07-15 DIAGNOSIS — I6522 Occlusion and stenosis of left carotid artery: Secondary | ICD-10-CM | POA: Diagnosis not present

## 2016-07-15 DIAGNOSIS — R29810 Facial weakness: Secondary | ICD-10-CM | POA: Insufficient documentation

## 2016-07-17 ENCOUNTER — Emergency Department (HOSPITAL_COMMUNITY)
Admission: EM | Admit: 2016-07-17 | Discharge: 2016-07-17 | Disposition: A | Discharge status: Home / Self Care | Payer: Medicare HMO | Attending: Emergency Medicine | Admitting: Emergency Medicine

## 2016-07-17 ENCOUNTER — Emergency Department (HOSPITAL_COMMUNITY): Payer: Medicare HMO

## 2016-07-17 ENCOUNTER — Encounter (HOSPITAL_COMMUNITY): Payer: Self-pay | Admitting: *Deleted

## 2016-07-17 DIAGNOSIS — Z79899 Other long term (current) drug therapy: Secondary | ICD-10-CM | POA: Diagnosis not present

## 2016-07-17 DIAGNOSIS — I10 Essential (primary) hypertension: Secondary | ICD-10-CM | POA: Diagnosis not present

## 2016-07-17 DIAGNOSIS — J40 Bronchitis, not specified as acute or chronic: Secondary | ICD-10-CM | POA: Diagnosis not present

## 2016-07-17 DIAGNOSIS — R05 Cough: Secondary | ICD-10-CM | POA: Diagnosis not present

## 2016-07-17 DIAGNOSIS — Z7982 Long term (current) use of aspirin: Secondary | ICD-10-CM | POA: Diagnosis not present

## 2016-07-17 DIAGNOSIS — R0981 Nasal congestion: Secondary | ICD-10-CM | POA: Diagnosis present

## 2016-07-17 MED ORDER — IPRATROPIUM-ALBUTEROL 0.5-2.5 (3) MG/3ML IN SOLN
3.0000 mL | Freq: Once | RESPIRATORY_TRACT | Status: AC
  Administered 2016-07-17: 3 mL via RESPIRATORY_TRACT
  Filled 2016-07-17: qty 3

## 2016-07-17 MED ORDER — BENZONATATE 100 MG PO CAPS
200.0000 mg | ORAL_CAPSULE | Freq: Three times a day (TID) | ORAL | 0 refills | Status: DC | PRN
Start: 2016-07-17 — End: 2016-09-14

## 2016-07-17 MED ORDER — PREDNISONE 50 MG PO TABS
60.0000 mg | ORAL_TABLET | Freq: Once | ORAL | Status: AC
  Administered 2016-07-17: 60 mg via ORAL
  Filled 2016-07-17: qty 1

## 2016-07-17 MED ORDER — ALBUTEROL SULFATE HFA 108 (90 BASE) MCG/ACT IN AERS
2.0000 | INHALATION_SPRAY | Freq: Once | RESPIRATORY_TRACT | Status: AC
  Administered 2016-07-17: 2 via RESPIRATORY_TRACT
  Filled 2016-07-17: qty 6.7

## 2016-07-17 MED ORDER — PREDNISONE 10 MG PO TABS: ORAL_TABLET | ORAL | 0 refills | Status: DC

## 2016-07-17 MED ORDER — ALBUTEROL SULFATE (2.5 MG/3ML) 0.083% IN NEBU
2.5000 mg | INHALATION_SOLUTION | Freq: Once | RESPIRATORY_TRACT | Status: AC
  Administered 2016-07-17: 2.5 mg via RESPIRATORY_TRACT
  Filled 2016-07-17: qty 3

## 2016-07-17 MED ORDER — AZITHROMYCIN 250 MG PO TABS: ORAL_TABLET | ORAL | 0 refills | Status: DC

## 2016-07-17 NOTE — ED Triage Notes (Signed)
Pt here for URI with cough and cold and chest and sinus congestion x2 weeks.

## 2016-07-17 NOTE — Discharge Instructions (Signed)
2 puffs of the inhaler 4 times a day as needed.  Follow-up with your doctor for recheck or return here for any worsening symtpoms

## 2016-07-17 NOTE — ED Notes (Signed)
Patient transported to X-ray 

## 2016-07-21 NOTE — ED Provider Notes (Signed)
[facility] DEPT Provider Note   CSN: [id number] Arrival date & time: 07/17/16  1225     History   Chief Complaint Chief Complaint  Patient presents with  . URI    HPI Bradley [name] is a 71 y.o. male.  HPI   Bradley [name] is a 70 y.o. male who presents to the Emergency Department complaining of nasal congestion, cough, chest congestion, for 2 weeks.  Cough has been occasionally productive.  She also reports associated sinus pressure.  She has tried OTC cold and cough medication.  She denies fever, chills, chest pain or shortness of breath.   Past Medical History:  Diagnosis Date  . Barrett esophagus    History of  . GERD (gastroesophageal reflux disease)   . Gout   . History of adenomatous polyp of colon   . Hyperlipidemia   . Hypertension   . Sleep apnea    Noncompliant with CPAP    Patient Active Problem List   Diagnosis Date Noted  . Obesity 10/27/2015  . Pernicious anemia 01/30/2014  . Personal history of colonic polyps 03/14/2013  . HYPERLIPIDEMIA, MIXED 12/25/2008  . GOUT 12/25/2008  . ANXIETY 12/25/2008  . OBSTRUCTIVE SLEEP APNEA 12/25/2008  . HYPERTENSION, BENIGN ESSENTIAL 12/25/2008  . GASTROESOPHAGEAL REFLUX DISEASE 12/25/2008  . BARRETTS ESOPHAGUS 12/25/2008  . PALPITATIONS 12/25/2008  . CHEST PAIN 12/25/2008    Past Surgical History:  Procedure Laterality Date  . COLONOSCOPY    . COLONOSCOPY  12/09/2004   [id number] polyp at 35 cm biopsied/normal rectum  . COLONOSCOPY   05/20/2008   [id number] midsigmoid s/p snare removal/normal rectum. Tubular adenoma  . COLONOSCOPY WITH ESOPHAGOGASTRODUODENOSCOPY (EGD) N/A 03/22/2013   Procedure: COLONOSCOPY WITH ESOPHAGOGASTRODUODENOSCOPY (EGD);  Surgeon: Daneil Dolin, MD;  Location: [facility];  Service: Endoscopy;  Laterality: N/A;  9:30  . ESOPHAGOGASTRODUODENOSCOPY  07/01/2003   [id number] colored epithelium involving essentially nearly the distal onehalf of the tubular  esophagus/Patulous esophagogastric junction/ Moderate sized hiatal hernia/Otherwise normal stomach, normal D1 and D2.  . ESOPHAGOGASTRODUODENOSCOPY  12/09/2004   [id number] epithelium  beginning at 33 cm consistent with previous diagnosis of Barrett's esophagus  segmentally biopsied. Hiatal hernia. Otherwise normal stomach. Normal D1 and D2  . ESOPHAGOGASTRODUODENOSCOPY   05/20/2008   [id number] epithelium distal esophagus consistent with Barrett's esophagus.  No significant change from prior exam. Multiple biopsies negative for dysplasia.  . TONSILLECTOMY         Home Medications    Prior to Admission medications   Medication Sig Start Date End Date Taking? Authorizing Provider  allopurinol (ZYLOPRIM) 100 MG tablet Take 100 mg by mouth daily.    Historical Provider, MD  ALPRAZolam Duanne Moron) 1 MG tablet Take 1 mg by mouth 2 times daily at 12 noon and 4 pm.     Historical Provider, MD  aspirin EC 81 MG tablet Take 81 mg by mouth daily.    Historical Provider, MD  atorvastatin (LIPITOR) 40 MG tablet Take 40 mg by mouth daily.    Historical Provider, MD  azithromycin (ZITHROMAX) 250 MG tablet Take first 2 tablets together, then 1 every day until finished. 07/17/16   Haadiya Frogge, PA-C  benzonatate (TESSALON) 100 MG capsule Take 2 capsules (200 mg total) by mouth 3 (three) times daily as needed for cough. Swallow whole, do not chew 07/17/16   Euell Schiff, PA-C  Cyanocobalamin (B-12 IJ) Inject as directed.    Historical Provider, MD  Furosemide (LASIX PO) Take by mouth.    Historical  Provider, MD  gabapentin (NEURONTIN) 300 MG capsule Take 300 mg by mouth 4 (four) times daily as needed.     Historical Provider, MD  lisinopril (PRINIVIL,ZESTRIL) 40 MG tablet Take by mouth. One half daily    Historical Provider, MD  omeprazole (PRILOSEC) 20 MG capsule Take 20 mg by mouth daily.    Historical Provider, MD  predniSONE (DELTASONE) 10 MG tablet Take 6 tablets day one, 5 tablets day  two, 4 tablets day three, 3 tablets day four, 2 tablets day five, then 1 tablet day six 07/17/16   Kem Parkinson, PA-C    Family History Family History  Problem Relation Age of Onset  . Heart attack Father 34    First MI in 32s  . Heart attack Brother 89  . Colon cancer Mother     age greater than 78s    Social History Social History  Substance Use Topics  . Smoking status: Never Smoker  . Smokeless tobacco: Never Used     Comment: Increased exposure to second hand smoke  . Alcohol use No     Allergies   Patient has no known allergies.   Review of Systems Review of Systems  Constitutional: Negative for activity change, appetite change, chills and fever.  HENT: Positive for congestion and rhinorrhea. Negative for facial swelling, sore throat and trouble swallowing.   Eyes: Negative for visual disturbance.  Respiratory: Positive for cough. Negative for shortness of breath, wheezing and stridor.   Gastrointestinal: Negative for nausea and vomiting.  Musculoskeletal: Negative for neck pain and neck stiffness.  Skin: Negative.   Neurological: Negative for dizziness, weakness, numbness and headaches.  Hematological: Negative for adenopathy.  Psychiatric/Behavioral: Negative for confusion.  All other systems reviewed and are negative.    Physical Exam Updated Vital Signs BP 125/81 (BP Location: Right Arm)   Pulse 93   Temp 97.8 F (36.6 C)   Resp 18   Ht 5\' 4"  (1.626 m)   Wt 95.3 kg   SpO2 98%   BMI 36.05 kg/m   Physical Exam  Constitutional: He is oriented to person, place, and time. He appears well-developed and well-nourished. No distress.  HENT:  Head: Normocephalic and atraumatic.  Right Ear: Tympanic membrane and ear canal normal.  Left Ear: Tympanic membrane and ear canal normal.  Nose: Mucosal edema and rhinorrhea present. Right sinus exhibits maxillary sinus tenderness. Left sinus exhibits no maxillary sinus tenderness.  Mouth/Throat: Uvula is midline  and mucous membranes are normal. No trismus in the jaw. No uvula swelling. No oropharyngeal exudate, posterior oropharyngeal edema, posterior oropharyngeal erythema or tonsillar abscesses.  Eyes: Conjunctivae are normal.  Neck: Normal range of motion and phonation normal. Neck supple.  Cardiovascular: Normal rate, regular rhythm and intact distal pulses.   No murmur heard. Pulmonary/Chest: Effort normal. No respiratory distress. He has no wheezes. He has no rales.  Coarse lung sounds bilaterally.  No rales or wheezes  Abdominal: Soft. He exhibits no distension. There is no tenderness. There is no rebound and no guarding.  Musculoskeletal: He exhibits no edema.  Lymphadenopathy:    He has no cervical adenopathy.  Neurological: He is alert and oriented to person, place, and time. He exhibits normal muscle tone. Coordination normal.  Skin: Skin is warm and dry.  Nursing note and vitals reviewed.    ED Treatments / Results  Labs (all labs ordered are listed, but only abnormal results are displayed) Labs Reviewed - No data to display  EKG  EKG Interpretation  None       Radiology No results found.  Procedures Procedures (including critical care time)  Medications Ordered in ED Medications  ipratropium-albuterol (DUONEB) 0.5-2.5 (3) MG/3ML nebulizer solution 3 mL (3 mLs Nebulization Given 07/17/16 1421)  albuterol (PROVENTIL) (2.5 MG/3ML) 0.083% nebulizer solution 2.5 mg (2.5 mg Nebulization Given 07/17/16 1421)  predniSONE (DELTASONE) tablet 60 mg (60 mg Oral Given 07/17/16 1404)  albuterol (PROVENTIL HFA;VENTOLIN HFA) 108 (90 Base) MCG/ACT inhaler 2 puff (2 puffs Inhalation Given 07/17/16 1432)     Initial Impression / Assessment and Plan / ED Course  I have reviewed the triage vital signs and the nursing notes.  Pertinent labs & imaging results that were available during my care of the patient were reviewed by me and considered in my medical decision making (see chart for  details).  Clinical Course     Pt well appearing, non-toxic.  Vitals stable.  CXR neg for PNA.  Sx's likely bronchitis.  Agrees to tx plan and close PMD f/u.  Pt appears stable for d/c  Inhaler dispensed   Final Clinical Impressions(s) / ED Diagnoses   Final diagnoses:  Bronchitis    New Prescriptions Discharge Medication List as of 07/17/2016  2:56 PM    START taking these medications   Details  azithromycin (ZITHROMAX) 250 MG tablet Take first 2 tablets together, then 1 every day until finished., Print    benzonatate (TESSALON) 100 MG capsule Take 2 capsules (200 mg total) by mouth 3 (three) times daily as needed for cough. Swallow whole, do not chew, Starting Sat 07/17/2016, Print    predniSONE (DELTASONE) 10 MG tablet Take 6 tablets day one, 5 tablets day two, 4 tablets day three, 3 tablets day four, 2 tablets day five, then 1 tablet day six, Print         Kem Parkinson, PA-C 07/21/16 2119    Margette Fast, MD 07/22/16 [phone]

## 2016-07-22 ENCOUNTER — Encounter: Payer: Self-pay | Admitting: Family Medicine

## 2016-07-22 ENCOUNTER — Ambulatory Visit (INDEPENDENT_AMBULATORY_CARE_PROVIDER_SITE_OTHER): Payer: Medicare HMO | Admitting: Family Medicine

## 2016-07-22 VITALS — BP 118/80 | Temp 97.8°F | Ht 64.0 in | Wt 225.0 lb

## 2016-07-22 DIAGNOSIS — J4520 Mild intermittent asthma, uncomplicated: Secondary | ICD-10-CM | POA: Diagnosis not present

## 2016-07-22 DIAGNOSIS — J209 Acute bronchitis, unspecified: Secondary | ICD-10-CM

## 2016-07-22 MED ORDER — AMOXICILLIN-POT CLAVULANATE 875-125 MG PO TABS: 1.0000 | ORAL_TABLET | Freq: Two times a day (BID) | ORAL | 0 refills | Status: AC

## 2016-07-22 MED ORDER — PREDNISONE 20 MG PO TABS: 20.0000 mg | ORAL_TABLET | Freq: Every day | ORAL | 0 refills | Status: DC

## 2016-07-22 NOTE — Progress Notes (Signed)
   Subjective:    Patient ID: Emilie Rutter, male    DOB: 1946-02-19, 71 y.o.   MRN: [id number]  Shortness of Breath  This is a new problem. The current episode started 1 to 4 weeks ago. The problem occurs intermittently. The problem has been unchanged. Associated symptoms include abdominal pain and wheezing. Nothing aggravates the symptoms. Associated symptoms comments: cough. The patient has no known risk factors for DVT/PE. Treatments tried: antibiotic, albuterol neb, prednisone, tussin cough med. The treatment provided mild relief.   Started last week, went to hosp short of breath  Bronchitis diagnosis at ER, given meds  Sob with exertion and wheezing,     Review of Systems  Respiratory: Positive for shortness of breath and wheezing.   Gastrointestinal: Positive for abdominal pain.       Objective:   Physical Exam Alert active good hydration HEENT moderate nasal congestion positive bronchial cough heart rare rhythm positive expiratory wheezes       Assessment & Plan:  Impression persistent reactive airways and bronchitis post viral syndrome plan antibiotics. Prednisone taper. Albuterol 2 sprays 4 times a day expect slow resolution warning signs discussed

## 2016-07-23 ENCOUNTER — Ambulatory Visit (INDEPENDENT_AMBULATORY_CARE_PROVIDER_SITE_OTHER): Payer: Medicare HMO | Admitting: Diagnostic Neuroimaging

## 2016-07-23 ENCOUNTER — Encounter: Payer: Self-pay | Admitting: Diagnostic Neuroimaging

## 2016-07-23 VITALS — BP 138/88 | HR 79 | Ht 64.0 in | Wt 224.0 lb

## 2016-07-23 DIAGNOSIS — R200 Anesthesia of skin: Secondary | ICD-10-CM

## 2016-07-23 DIAGNOSIS — B0229 Other postherpetic nervous system involvement: Secondary | ICD-10-CM

## 2016-07-23 DIAGNOSIS — H9193 Unspecified hearing loss, bilateral: Secondary | ICD-10-CM | POA: Diagnosis not present

## 2016-07-23 DIAGNOSIS — H02403 Unspecified ptosis of bilateral eyelids: Secondary | ICD-10-CM | POA: Diagnosis not present

## 2016-07-23 MED ORDER — PYRIDOSTIGMINE BROMIDE 60 MG PO TABS: 30.0000 mg | ORAL_TABLET | Freq: Three times a day (TID) | ORAL | 6 refills | Status: DC

## 2016-07-23 NOTE — Progress Notes (Signed)
[facility]  PATIENT: Bradley [name] DOB: Mar 14, 1946  REFERRING CLINICIAN: Lance Sell, MD HISTORY FROM: patient  REASON FOR VISIT: new consult    HISTORICAL  CHIEF COMPLAINT:  Chief Complaint  Patient presents with  . Ptosis of right eyelid    rm 6, New Pt, "eyelids closing, lips get numb; pain behind right eye; began Oct 2017"    HISTORY OF PRESENT ILLNESS:   71 year old ambidextrous male with hypertension, hyperlipidemia, objective sleep apnea, gout, here for evaluation of bilateral eyelid drooping and perioral numbness.  Approximately 10 years ago patient had shingles affecting his right posterior neck and scalp. Ever since that time he has had intermittent postherpetic neuralgia pain symptoms in his right jaw, right scalp and right face. Symptoms seem to be aggravated by cold temperature or chewing.  October 2017 patient was at a fair, in cold weather, when he noted his right eyelid was drooping down. Left eyelid was partially drooping as well. He was also having increased sensitivity to light. He had difficult time driving back home. Ever since that time he has had intermittent fluctuating eyelid drooping and photosensitivity. He denies any speech problems, swallowing difficulty, extremity weakness, breathing problems. No similar problems in the past.  Patient was evaluated by PCP who ordered MRI brain and MRA head which showed no significant findings. Carotid ultrasound showed no significant stenosis.   REVIEW OF SYSTEMS: Full 14 system review of systems performed and negative with exception of: Easy bruising easy bleeding shortness of breath constipation impotence moles hearing loss fatigue.   ALLERGIES: No Known Allergies  HOME MEDICATIONS: Outpatient Medications Prior to Visit  Medication Sig Dispense Refill  . allopurinol (ZYLOPRIM) 100 MG tablet Take 100 mg by mouth daily.    Marland Kitchen ALPRAZolam (XANAX) 1 MG tablet Take 1 mg by mouth 2 times daily at  12 noon and 4 pm.     . amoxicillin-clavulanate (AUGMENTIN) 875-125 MG tablet Take 1 tablet by mouth 2 (two) times daily. 20 tablet 0  . aspirin EC 81 MG tablet Take 81 mg by mouth daily.    Marland Kitchen atorvastatin (LIPITOR) 40 MG tablet Take 40 mg by mouth daily.    Marland Kitchen azithromycin (ZITHROMAX) 250 MG tablet Take first 2 tablets together, then 1 every day until finished. 6 tablet 0  . benzonatate (TESSALON) 100 MG capsule Take 2 capsules (200 mg total) by mouth 3 (three) times daily as needed for cough. Swallow whole, do not chew 21 capsule 0  . Cyanocobalamin (B-12 IJ) Inject as directed.    . Furosemide (LASIX PO) Take by mouth.    . gabapentin (NEURONTIN) 300 MG capsule Take 300 mg by mouth 4 (four) times daily as needed.     Marland Kitchen lisinopril (PRINIVIL,ZESTRIL) 40 MG tablet Take by mouth. One half daily    . omeprazole (PRILOSEC) 20 MG capsule Take 20 mg by mouth daily.    . predniSONE (DELTASONE) 20 MG tablet Take 1 tablet (20 mg total) by mouth daily. 18 tablet 0  . predniSONE (DELTASONE) 10 MG tablet Take 6 tablets day one, 5 tablets day two, 4 tablets day three, 3 tablets day four, 2 tablets day five, then 1 tablet day six 21 tablet 0   No facility-administered medications prior to visit.     PAST MEDICAL HISTORY: Past Medical History:  Diagnosis Date  . Barrett esophagus    History of  . GERD (gastroesophageal reflux disease)   . Gout   . History of adenomatous polyp of  colon   . Hyperlipidemia   . Hypertension   . Shingles    hx of, on back of neck  . Sleep apnea    Noncompliant with CPAP    PAST SURGICAL HISTORY: Past Surgical History:  Procedure Laterality Date  . COLONOSCOPY    . COLONOSCOPY  12/09/2004   [id number] polyp at 35 cm biopsied/normal rectum  . COLONOSCOPY   05/20/2008   [id number] midsigmoid s/p snare removal/normal rectum. Tubular adenoma  . COLONOSCOPY WITH ESOPHAGOGASTRODUODENOSCOPY (EGD) N/A 03/22/2013   Procedure: COLONOSCOPY WITH ESOPHAGOGASTRODUODENOSCOPY  (EGD);  Surgeon: Daneil Dolin, MD;  Location: [facility];  Service: Endoscopy;  Laterality: N/A;  9:30  . ESOPHAGOGASTRODUODENOSCOPY  07/01/2003   [id number] colored epithelium involving essentially nearly the distal onehalf of the tubular esophagus/Patulous esophagogastric junction/ Moderate sized hiatal hernia/Otherwise normal stomach, normal D1 and D2.  . ESOPHAGOGASTRODUODENOSCOPY  12/09/2004   [id number] epithelium  beginning at 33 cm consistent with previous diagnosis of Barrett's esophagus  segmentally biopsied. Hiatal hernia. Otherwise normal stomach. Normal D1 and D2  . ESOPHAGOGASTRODUODENOSCOPY   05/20/2008   [id number] epithelium distal esophagus consistent with Barrett's esophagus.  No significant change from prior exam. Multiple biopsies negative for dysplasia.  . TONSILLECTOMY      FAMILY HISTORY: Family History  Problem Relation Age of Onset  . Heart attack Father 72    First MI in 52s  . Cancer Brother     prostate  . Colon cancer Mother     age greater than 80s  . Cancer Sister     brain  . COPD Brother     SOCIAL HISTORY:  Social History   Social History  . Marital status: Married    Spouse name: Merchandiser, retail  . Number of children: 2  . Years of education: 14   Occupational History  . Retired     [employer] History Main Topics  . Smoking status: Never Smoker  . Smokeless tobacco: Never Used     Comment: Increased exposure to second hand smoke  . Alcohol use No  . Drug use: No  . Sexual activity: Not on file   Other Topics Concern  . Not on file   Social History Narrative   Lives with wife   2 children, 1 passed away   caffeine- coffee 1-2 daily     PHYSICAL EXAM  GENERAL EXAM/CONSTITUTIONAL: Vitals:  Vitals:   07/23/16 0844  BP: 138/88  Pulse: 79  Weight: 224 lb (101.6 kg)  Height: 5\' 4"  (1.626 m)     Body mass index is 38.45 kg/m.  Visual Acuity Screening   Right eye Left eye Both eyes    Without correction:     With correction: 20/30 20/40      Patient is in no distress; well developed, nourished and groomed; neck is supple  CARDIOVASCULAR:  Examination of carotid arteries is normal; no carotid bruits  Regular rate and rhythm, no murmurs  Examination of peripheral vascular system by observation and palpation is normal  EYES:  Ophthalmoscopic exam of optic discs and posterior segments is normal; no papilledema or hemorrhages  MUSCULOSKELETAL:  Gait, strength, tone, movements noted in Neurologic exam below  NEUROLOGIC: MENTAL STATUS:  No flowsheet data found.  awake, alert, oriented to person, place and time  recent and remote memory intact  normal attention and concentration  language fluent, comprehension intact, naming intact,   fund of knowledge appropriate  CRANIAL NERVE:   2nd -  no papilledema on fundoscopic exam  2nd, 3rd, 4th, 6th - pupils equal and reactive to light, visual fields full to confrontation, extraocular muscles intact, no nystagmus; BILATERAL PTOSIS (RIGHT WORSE THAN LEFT)  5th - facial sensation symmetric  7th - facial strength symmetric  8th - hearing DECR  9th - palate elevates symmetrically, uvula midline  11th - shoulder shrug symmetric  12th - tongue protrusion midline  MOTOR:   normal bulk and tone, full strength in the BUE, BLE  SENSORY:   normal and symmetric to light touch, temperature, vibration  COORDINATION:   finger-nose-finger, fine finger movements normal  REFLEXES:   deep tendon reflexes TRACE and symmetric  GAIT/STATION:   narrow based gait; DIFF WITH TANDEM; romberg is negative    DIAGNOSTIC DATA (LABS, IMAGING, TESTING) - I reviewed patient records, labs, notes, testing and imaging myself where available.  Lab Results  Component Value Date   WBC 6.9 05/07/2013   HGB 16.0 05/07/2013   HCT 46.0 05/07/2013   MCV 91.1 05/07/2013   PLT 205 05/07/2013      Component Value  Date/Time   NA 139 06/18/2016 0906   K 5.0 06/18/2016 0906   CL 100 06/18/2016 0906   CO2 25 06/18/2016 0906   GLUCOSE 103 (H) 06/18/2016 0906   GLUCOSE 99 05/07/2013 1301   BUN 17 06/18/2016 0906   CREATININE 1.15 06/18/2016 0906   CALCIUM 9.2 06/18/2016 0906   PROT 6.5 08/07/2012 0900   ALBUMIN 3.8 08/07/2012 0900   AST 19 08/07/2012 0900   ALT 22 08/07/2012 0900   ALKPHOS 49 08/07/2012 0900   BILITOT 0.5 08/07/2012 0900   GFRNONAA 65 06/18/2016 0906   GFRAA 75 06/18/2016 0906   Lab Results  Component Value Date   CHOL 131 06/18/2016   HDL 33 (L) 06/18/2016   LDLCALC 77 06/18/2016   TRIG 106 06/18/2016   CHOLHDL 4.0 06/18/2016   No results found for: HGBA1C No results found for: VITAMINB12 No results found for: TSH  06/23/16 MRI brain [I reviewed images myself and agree with interpretation. -VRP] - No acute finding. Normal for age. Few small punctate white matter foci, not likely significant.  06/23/16 MRA head [I reviewed images myself and agree with interpretation. -VRP]  - Normal intracranial MR angiography of the large and medium size vessels.  07/15/16 Carotid u/s  1. No evidence of significant carotid stenosis in the neck bilaterally. No evidence of right carotid stenosis. Minimal plaque on the left with estimated left ICA stenosis of less than 50%. 2. Nonvisualization of the right vertebral artery by duplex ultrasound. The left vertebral artery demonstrates antegrade flow.     ASSESSMENT AND PLAN  71 y.o. year old male here with new onset bilateral ptosis, fluctuating, right worse than left side, since October 2017. Signs and symptoms suspicious for neuromuscular junction disorder. Will check additional testing.   Ddx: myasthenia gravis (ocular), myopathy, thyroid dz, diabetes  1. Ptosis of both eyelids   2. Facial numbness   3. Bilateral hearing loss, unspecified hearing loss type   4. Post herpetic neuralgia      PLAN: - check labs - empiric  trial of pyridostigmine 30mg  BID - TID  Orders Placed This Encounter  Procedures  . Acetylcholine Receptor, Binding  . Hemoglobin A1c  . TSH  . CK  . Aldolase   Meds ordered this encounter  Medications  . pyridostigmine (MESTINON) 60 MG tablet    Sig: Take 0.5 tablets (30 mg total) by mouth  3 (three) times daily.    Dispense:  60 tablet    Refill:  6   Return in about 1 month (around 08/23/2016).    Penni Bombard, MD [id number], [id number] AM Certified in Neurology, Neurophysiology and Neuroimaging  [facility] [address], [place], [place] [address] [phone]

## 2016-07-23 NOTE — Patient Instructions (Signed)
Thank you for coming to see [place] at [facility]. I hope we have been able to provide you high quality care today.  You may receive a patient satisfaction survey over the next few weeks. We would appreciate your feedback and comments so that we may continue to improve ourselves and the health of our patients.  - I will check labs  - try pyridostigmine 44m (which is half of a 626mtab) twice a day; may increase to three times per day   ~~~~~~~~~~~~~~~~~~~~~~~~~~~~~~~~~~~~~~~~~~~~~~~~~~~~~~~~~~~~~~~~~  DR. Sully Dyment'S GUIDE TO HAPPY AND HEALTHY LIVING These are some of my general health and wellness recommendations. Some of them may apply to you better than others. Please use common sense as you try these suggestions and feel free to ask me any questions.   ACTIVITY/FITNESS Mental, social, emotional and physical stimulation are very important for brain and body health. Try learning a new activity (arts, music, language, sports, games).  Keep moving your body to the best of your abilities. You can do this at home, inside or outside, the park, community center, gym or anywhere you like. Consider a physical therapist or personal trainer to get started. Consider the app Sworkit. Fitness trackers such as smart-watches, smart-phones or Fitbits can help as well.   NUTRITION Eat more plants: colorful vegetables, nuts, seeds and berries.  Eat less sugar, salt, preservatives and processed foods.  Avoid toxins such as cigarettes and alcohol.  Drink water when you are thirsty. Warm water with a slice of lemon is an excellent morning drink to start the day.  Consider these websites for more information The Nutrition Source (hthttps://www.henry-hernandez.biz/[employer] Nutrition (wwWindowBlog.ch  RELAXATION Consider practicing mindfulness meditation or other relaxation techniques such as deep breathing, prayer, yoga, tai chi, massage. See  website mindful.org or the apps Headspace or Calm to help get started.   SLEEP Try to get at least 7-8+ hours sleep per day. Regular exercise and reduced caffeine will help you sleep better. Practice good sleep hygeine techniques. See website sleep.org for more information.   PLANNING Prepare estate planning, living will, healthcare POA documents. Sometimes this is best planned with the help of an attorney. Theconversationproject.org and agingwithdignity.org are excellent resources.

## 2016-07-27 LAB — ACETYLCHOLINE RECEPTOR, BINDING: AChR Binding Ab, Serum: 12.9 nmol/L — ABNORMAL HIGH (ref 0.00–0.24)

## 2016-07-27 LAB — ALDOLASE: ALDOLASE: 5.6 U/L (ref 3.3–10.3)

## 2016-07-27 LAB — HEMOGLOBIN A1C
Est. average glucose Bld gHb Est-mCnc: 114 mg/dL
Hgb A1c MFr Bld: 5.6 % (ref 4.8–5.6)

## 2016-07-27 LAB — TSH: TSH: 2.73 u[IU]/mL (ref 0.450–4.500)

## 2016-07-27 LAB — CK: Total CK: 59 U/L (ref 24–204)

## 2016-07-28 ENCOUNTER — Telehealth: Payer: Self-pay | Admitting: *Deleted

## 2016-07-28 NOTE — Telephone Encounter (Signed)
LVM requesting patient call back re: lab results.  

## 2016-07-28 NOTE — Telephone Encounter (Signed)
Per Dr Leta Baptist , spoke with patient and informed him of his abnormal labs. 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 Advised he continue the pyridostigmine. The patient stated he is currently taking 1/2 tab twice a day, and he has already noticed improvement. Confirmed his follow up next month, advised he call sooner as needed. He verbalized understanding, appreciation of call.

## 2016-07-28 NOTE — Telephone Encounter (Signed)
Patient is returning a call for lab results. °

## 2016-07-28 NOTE — Telephone Encounter (Signed)
LVM requesting call back.

## 2016-07-28 NOTE — Telephone Encounter (Signed)
Pt returned RN's call °

## 2016-09-14 ENCOUNTER — Encounter: Payer: Self-pay | Admitting: Diagnostic Neuroimaging

## 2016-09-14 ENCOUNTER — Telehealth: Payer: Self-pay | Admitting: Diagnostic Neuroimaging

## 2016-09-14 ENCOUNTER — Ambulatory Visit (INDEPENDENT_AMBULATORY_CARE_PROVIDER_SITE_OTHER): Payer: Medicare HMO | Admitting: Diagnostic Neuroimaging

## 2016-09-14 VITALS — BP 99/63 | HR 77 | Wt 226.6 lb

## 2016-09-14 DIAGNOSIS — B0229 Other postherpetic nervous system involvement: Secondary | ICD-10-CM | POA: Diagnosis not present

## 2016-09-14 DIAGNOSIS — G7000 Myasthenia gravis without (acute) exacerbation: Secondary | ICD-10-CM | POA: Diagnosis not present

## 2016-09-14 MED ORDER — PYRIDOSTIGMINE BROMIDE 60 MG PO TABS: 30.0000 mg | ORAL_TABLET | Freq: Three times a day (TID) | ORAL | 4 refills | Status: DC

## 2016-09-14 MED ORDER — PYRIDOSTIGMINE BROMIDE 60 MG PO TABS: 30.0000 mg | ORAL_TABLET | Freq: Three times a day (TID) | ORAL | 6 refills | Status: DC

## 2016-09-14 NOTE — Addendum Note (Signed)
Addended byAndrey Spearman on: 09/14/2016 04:41 PM   Modules accepted: Orders

## 2016-09-14 NOTE — Telephone Encounter (Signed)
Pt asking if Dr Mamie Nick is ordering a CT of Thymus. Pt wants to go to [facility] to do it. Please let pt know if order is placed for CT.

## 2016-09-14 NOTE — Progress Notes (Addendum)
[facility]  PATIENT: Bradley [name] DOB: 04-24-1946  REFERRING CLINICIAN: Lance Sell, MD HISTORY FROM: patient  REASON FOR VISIT: follow up   HISTORICAL  CHIEF COMPLAINT:  Chief Complaint  Patient presents with  . Ptosis of both eyelids    rm 6, wife- Jinger, "medicine working well, no side effects noted"  . Follow-up    6 weeks    HISTORY OF PRESENT ILLNESS:   UPDATE 09/14/16: Since last visit, doing well. Tolerating mestinon with good results. Ptosis resolved. No generalized weakness. No aggravating factors.   PRIOR HPI (07/23/16): 71 year old ambidextrous male with hypertension, hyperlipidemia, objective sleep apnea, gout, here for evaluation of bilateral eyelid drooping and perioral numbness. Approximately 10 years ago patient had shingles affecting his right posterior neck and scalp. Ever since that time he has had intermittent postherpetic neuralgia pain symptoms in his right jaw, right scalp and right face. Symptoms seem to be aggravated by cold temperature or chewing. October 2017 patient was at a fair, in cold weather, when he noted his right eyelid was drooping down. Left eyelid was partially drooping as well. He was also having increased sensitivity to light. He had difficult time driving back home. Ever since that time he has had intermittent fluctuating eyelid drooping and photosensitivity. He denies any speech problems, swallowing difficulty, extremity weakness, breathing problems. No similar problems in the past. Patient was evaluated by PCP who ordered MRI brain and MRA head which showed no significant findings. Carotid ultrasound showed no significant stenosis.   REVIEW OF SYSTEMS: Full 14 system review of systems performed and negative with exception of: sleep apnea; not on CPAP.   ALLERGIES: No Known Allergies  HOME MEDICATIONS: Outpatient Medications Prior to Visit  Medication Sig Dispense Refill  . allopurinol (ZYLOPRIM) 100 MG tablet  Take 100 mg by mouth daily.    Marland Kitchen ALPRAZolam (XANAX) 1 MG tablet Take 1 mg by mouth 2 times daily at 12 noon and 4 pm.     . aspirin EC 81 MG tablet Take 81 mg by mouth daily.    Marland Kitchen atorvastatin (LIPITOR) 40 MG tablet Take 40 mg by mouth daily.    . Cyanocobalamin (B-12 IJ) Inject as directed.    . Furosemide (LASIX PO) Take by mouth.    . gabapentin (NEURONTIN) 300 MG capsule Take 300 mg by mouth 4 (four) times daily as needed.     Marland Kitchen lisinopril (PRINIVIL,ZESTRIL) 40 MG tablet Take by mouth. One half daily    . omeprazole (PRILOSEC) 20 MG capsule Take 20 mg by mouth daily.    Marland Kitchen pyridostigmine (MESTINON) 60 MG tablet Take 0.5 tablets (30 mg total) by mouth 3 (three) times daily. 60 tablet 6  . azithromycin (ZITHROMAX) 250 MG tablet Take first 2 tablets together, then 1 every day until finished. 6 tablet 0  . benzonatate (TESSALON) 100 MG capsule Take 2 capsules (200 mg total) by mouth 3 (three) times daily as needed for cough. Swallow whole, do not chew 21 capsule 0  . predniSONE (DELTASONE) 20 MG tablet Take 1 tablet (20 mg total) by mouth daily. 18 tablet 0   No facility-administered medications prior to visit.     PAST MEDICAL HISTORY: Past Medical History:  Diagnosis Date  . Barrett esophagus    History of  . GERD (gastroesophageal reflux disease)   . Gout   . History of adenomatous polyp of colon   . Hyperlipidemia   . Hypertension   . Shingles    hx  of, on back of neck  . Sleep apnea    Noncompliant with CPAP    PAST SURGICAL HISTORY: Past Surgical History:  Procedure Laterality Date  . COLONOSCOPY    . COLONOSCOPY  12/09/2004   [id number] polyp at 35 cm biopsied/normal rectum  . COLONOSCOPY   05/20/2008   [id number] midsigmoid s/p snare removal/normal rectum. Tubular adenoma  . COLONOSCOPY WITH ESOPHAGOGASTRODUODENOSCOPY (EGD) N/A 03/22/2013   Procedure: COLONOSCOPY WITH ESOPHAGOGASTRODUODENOSCOPY (EGD);  Surgeon: Daneil Dolin, MD;  [facility]: [facility];  Service:  Endoscopy;  Laterality: N/A;  9:30  . ESOPHAGOGASTRODUODENOSCOPY  07/01/2003   [id number] colored epithelium involving essentially nearly the distal onehalf of the tubular esophagus/Patulous esophagogastric junction/ Moderate sized hiatal hernia/Otherwise normal stomach, normal D1 and D2.  . ESOPHAGOGASTRODUODENOSCOPY  12/09/2004   [id number] epithelium  beginning at 33 cm consistent with previous diagnosis of Barrett's esophagus  segmentally biopsied. Hiatal hernia. Otherwise normal stomach. Normal D1 and D2  . ESOPHAGOGASTRODUODENOSCOPY   05/20/2008   [id number] epithelium distal esophagus consistent with Barrett's esophagus.  No significant change from prior exam. Multiple biopsies negative for dysplasia.  . TONSILLECTOMY      FAMILY HISTORY: Family History  Problem Relation Age of Onset  . Heart attack Father 56    First MI in 7s  . Cancer Brother     prostate  . Colon cancer Mother     age greater than 60s  . Cancer Sister     brain  . COPD Brother     SOCIAL HISTORY:  Social History   Social History  . Marital status: Married    Spouse name: Merchandiser, retail  . Number of children: 2  . Years of education: 14   Occupational History  . Retired     [employer] History Main Topics  . Smoking status: Never Smoker  . Smokeless tobacco: Never Used     Comment: Increased exposure to second hand smoke  . Alcohol use No  . Drug use: No  . Sexual activity: Not on file   Other Topics Concern  . Not on file   Social History Narrative   Lives with wife   2 children, 1 passed away   caffeine- coffee 1-2 daily     PHYSICAL EXAM  GENERAL EXAM/CONSTITUTIONAL: Vitals:  Vitals:   09/14/16 1444  BP: 99/63  Pulse: 77  Weight: 226 lb 9.6 oz (102.8 kg)   Body mass index is 38.9 kg/m. No exam data present  Patient is in no distress; well developed, nourished and groomed; neck is supple  CARDIOVASCULAR:  Examination of carotid arteries  is normal; no carotid bruits  Regular rate and rhythm, no murmurs  Examination of peripheral vascular system by observation and palpation is normal  EYES:  Ophthalmoscopic exam of optic discs and posterior segments is normal; no papilledema or hemorrhages  MUSCULOSKELETAL:  Gait, strength, tone, movements noted in Neurologic exam below  NEUROLOGIC: MENTAL STATUS:  No flowsheet data found.  awake, alert, oriented to person, place and time  recent and remote memory intact  normal attention and concentration  language fluent, comprehension intact, naming intact,   fund of knowledge appropriate  CRANIAL NERVE:   2nd - no papilledema on fundoscopic exam  2nd, 3rd, 4th, 6th - pupils equal and reactive to light, visual fields full to confrontation, extraocular muscles intact, no nystagmus  5th - facial sensation symmetric  7th - facial strength symmetric  8th - hearing DECR  9th -  palate elevates symmetrically, uvula midline  11th - shoulder shrug symmetric  12th - tongue protrusion midline  MOTOR:   normal bulk and tone, full strength in the BUE, BLE  SENSORY:   normal and symmetric to light touch, temperature, vibration  COORDINATION:   finger-nose-finger, fine finger movements normal  REFLEXES:   deep tendon reflexes TRACE and symmetric  GAIT/STATION:   narrow based gait    DIAGNOSTIC DATA (LABS, IMAGING, TESTING) - I reviewed patient records, labs, notes, testing and imaging myself where available.  Lab Results  Component Value Date   WBC 6.9 05/07/2013   HGB 16.0 05/07/2013   HCT 46.0 05/07/2013   MCV 91.1 05/07/2013   PLT 205 05/07/2013      Component Value Date/Time   NA 139 06/18/2016 0906   K 5.0 06/18/2016 0906   CL 100 06/18/2016 0906   CO2 25 06/18/2016 0906   GLUCOSE 103 (H) 06/18/2016 0906   GLUCOSE 99 05/07/2013 1301   BUN 17 06/18/2016 0906   CREATININE 1.15 06/18/2016 0906   CALCIUM 9.2 06/18/2016 0906   PROT 6.5  08/07/2012 0900   ALBUMIN 3.8 08/07/2012 0900   AST 19 08/07/2012 0900   ALT 22 08/07/2012 0900   ALKPHOS 49 08/07/2012 0900   BILITOT 0.5 08/07/2012 0900   GFRNONAA 65 06/18/2016 0906   GFRAA 75 06/18/2016 0906   Lab Results  Component Value Date   CHOL 131 06/18/2016   HDL 33 (L) 06/18/2016   LDLCALC 77 06/18/2016   TRIG 106 06/18/2016   CHOLHDL 4.0 06/18/2016   Lab Results  Component Value Date   HGBA1C 5.6 07/23/2016   No results found for: VITAMINB12 Lab Results  Component Value Date   TSH 2.730 07/23/2016    AChR Binding Ab, Serum  Date Value Ref Range Status  07/23/2016 12.90 (H) 0.00 - 0.24 nmol/L Final    Comment:    **Results verified by repeat testing**                                Negative:   0.00 - 0.24                                Borderline: 0.25 - 0.40                                Positive:        > 0.40     06/23/16 MRI brain [I reviewed images myself and agree with interpretation. -VRP] - No acute finding. Normal for age. Few small punctate white matter foci, not likely significant.  06/23/16 MRA head [I reviewed images myself and agree with interpretation. -VRP]  - Normal intracranial MR angiography of the large and medium size vessels.  07/15/16 Carotid u/s  1. No evidence of significant carotid stenosis in the neck bilaterally. No evidence of right carotid stenosis. Minimal plaque on the left with estimated left ICA stenosis of less than 50%. 2. Nonvisualization of the right vertebral artery by duplex ultrasound. The left vertebral artery demonstrates antegrade flow.     ASSESSMENT AND PLAN  71 y.o. year old male here with new onset bilateral ptosis, fluctuating, right worse than left side, since October 2017. AchR antibody positive.    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 CT CHEST W CONTRAST   Meds ordered this encounter  Medications  . DISCONTD: pyridostigmine (MESTINON) 60 MG tablet    Sig: Take 0.5 tablets (30 mg total) by mouth 3 (three) times daily.    Dispense:  60 tablet    Refill:  6  . pyridostigmine (MESTINON) 60 MG tablet    Sig: Take 0.5 tablets (30 mg total) by mouth 3 (three) times daily.    Dispense:  180 tablet    Refill:  4   Return in about 4 months (around 01/12/2017).    Penni Bombard, MD [id number], [id number] PM Certified in Neurology, Neurophysiology and Neuroimaging  [facility] [address] [place], [place] [address] [phone]

## 2016-09-14 NOTE — Telephone Encounter (Signed)
I just put in order for CT chest. -VRP

## 2016-09-15 ENCOUNTER — Encounter: Payer: Self-pay | Admitting: *Deleted

## 2016-09-15 NOTE — Progress Notes (Signed)
Patient uses [facility] for most prescriptions p [phone] f [phone]

## 2016-10-08 ENCOUNTER — Telehealth: Payer: Self-pay | Admitting: Diagnostic Neuroimaging

## 2016-10-08 NOTE — Telephone Encounter (Signed)
Patient is scheduled to have his CT at [facility] on 10/21/16 at 2:00 pm.. But they asked if he had his BUN & Creatine done and I was not sure... They told me if he hasn't had it done he can get the blood work there at 1:30 pm 30 mins before the exam at [facility].Marland Kitchen

## 2016-10-11 ENCOUNTER — Other Ambulatory Visit: Payer: Self-pay | Admitting: *Deleted

## 2016-10-11 DIAGNOSIS — B0229 Other postherpetic nervous system involvement: Secondary | ICD-10-CM

## 2016-10-11 DIAGNOSIS — G7000 Myasthenia gravis without (acute) exacerbation: Secondary | ICD-10-CM

## 2016-10-21 ENCOUNTER — Encounter (HOSPITAL_COMMUNITY): Payer: Self-pay

## 2016-10-21 ENCOUNTER — Ambulatory Visit (HOSPITAL_COMMUNITY)
Admission: RE | Admit: 2016-10-21 | Discharge: 2016-10-21 | Disposition: A | Discharge status: Home / Self Care | Payer: Medicare HMO | Source: Ambulatory Visit | Attending: Diagnostic Neuroimaging | Admitting: Diagnostic Neuroimaging

## 2016-10-21 DIAGNOSIS — G7000 Myasthenia gravis without (acute) exacerbation: Secondary | ICD-10-CM

## 2016-10-21 DIAGNOSIS — K760 Fatty (change of) liver, not elsewhere classified: Secondary | ICD-10-CM | POA: Insufficient documentation

## 2016-10-21 DIAGNOSIS — R918 Other nonspecific abnormal finding of lung field: Secondary | ICD-10-CM | POA: Diagnosis not present

## 2016-10-21 LAB — POCT I-STAT CREATININE: Creatinine, Ser: 1.2 mg/dL (ref 0.61–1.24)

## 2016-10-21 MED ORDER — IOPAMIDOL (ISOVUE-300) INJECTION 61%
75.0000 mL | Freq: Once | INTRAVENOUS | Status: AC | PRN
  Administered 2016-10-21: 75 mL via INTRAVENOUS

## 2016-10-22 ENCOUNTER — Telehealth: Payer: Self-pay | Admitting: *Deleted

## 2016-10-22 NOTE — Telephone Encounter (Signed)
Called and LVM relaying results per VP,MD note. Gave [facility] phone number if he has further questions or concerns.

## 2016-10-22 NOTE — Telephone Encounter (Signed)
-----   Message from Penni Bombard, MD sent at 10/22/2016  1:03 PM EDT ----- Unremarkable study. No evidence of thymoma. Please call patient. Continue current plan. -VRP

## 2016-10-26 ENCOUNTER — Telehealth: Payer: Self-pay | Admitting: Diagnostic Neuroimaging

## 2016-10-26 NOTE — Telephone Encounter (Signed)
Pt wife is calling because pt eye is getting worse and she wants to know how much to increase the pyridostigmine (MESTINON) 60 MG tablet to

## 2016-10-26 NOTE — Telephone Encounter (Signed)
Spoke to wife and then pt.  He has noted the last week, progressive worsening of bilateral eyelid drooping.  Taking mestinin 30mg  po tid. Increase?

## 2016-10-26 NOTE — Telephone Encounter (Signed)
May increase up to 30-60mg  TID. -VRP

## 2016-10-26 NOTE — Telephone Encounter (Signed)
Spoke to wife and relayed that pt can take 180mg  po daily of the mestinon (divided doses as he feels.  Wife verbalized understanding.

## 2016-12-01 MED ORDER — PYRIDOSTIGMINE BROMIDE 60 MG PO TABS: 30.0000 mg | ORAL_TABLET | Freq: Three times a day (TID) | ORAL | 4 refills | Status: DC

## 2016-12-01 NOTE — Telephone Encounter (Signed)
Pt called UX:[id number] (MESTINON) 60 MG tablet he said he was told that if he needed to take more do so, he has done so but the medication is not due for a refill.  Pt is asking to be called to know if another prescription can be called in as a result of him increasing his intake

## 2016-12-01 NOTE — Addendum Note (Signed)
Addended byAndrey Spearman on: 12/01/2016 10:32 AM   Modules accepted: Orders

## 2016-12-01 NOTE — Telephone Encounter (Signed)
New rx sent in electronically. -VRP

## 2017-01-12 ENCOUNTER — Ambulatory Visit (INDEPENDENT_AMBULATORY_CARE_PROVIDER_SITE_OTHER): Payer: Medicare HMO | Admitting: Diagnostic Neuroimaging

## 2017-01-12 ENCOUNTER — Encounter: Payer: Self-pay | Admitting: Diagnostic Neuroimaging

## 2017-01-12 VITALS — BP 117/77 | HR 66 | Wt 217.0 lb

## 2017-01-12 DIAGNOSIS — G7000 Myasthenia gravis without (acute) exacerbation: Secondary | ICD-10-CM

## 2017-01-12 DIAGNOSIS — G4733 Obstructive sleep apnea (adult) (pediatric): Secondary | ICD-10-CM | POA: Diagnosis not present

## 2017-01-12 MED ORDER — PYRIDOSTIGMINE BROMIDE 60 MG PO TABS: 60.0000 mg | ORAL_TABLET | Freq: Three times a day (TID) | ORAL | 4 refills | Status: DC

## 2017-01-12 NOTE — Progress Notes (Signed)
[facility]  PATIENT: Bradley [name] DOB: 1945-12-25  REFERRING CLINICIAN: Lance Sell, MD HISTORY FROM: patient and wife REASON FOR VISIT: follow up   HISTORICAL  CHIEF COMPLAINT:  Chief Complaint  Patient presents with  . Ocular myasthenia    rm 6, wife- Jinger, "taking Mestinon one tab 3 x daily, it wears off; less energy"  . Follow-up    4 month    HISTORY OF PRESENT ILLNESS:   UPDATE 01/12/17: Since last visit, doing well. Tolerating mestinon 60mg  TID. No ptosis. Mild intermittent vision blurring. No drooling or diarrhea. Still with witnessed apnea and snoring.   UPDATE 09/14/16: Since last visit, doing well. Tolerating mestinon with good results. Ptosis resolved. No generalized weakness. No aggravating factors.   PRIOR HPI (07/23/16): 71 year old ambidextrous male with hypertension, hyperlipidemia, obstructive sleep apnea, gout, here for evaluation of bilateral eyelid drooping and perioral numbness. Approximately 10 years ago patient had shingles affecting his right posterior neck and scalp. Ever since that time he has had intermittent postherpetic neuralgia pain symptoms in his right jaw, right scalp and right face. Symptoms seem to be aggravated by cold temperature or chewing. October 2017 patient was at a fair, in cold weather, when he noted his right eyelid was drooping down. Left eyelid was partially drooping as well. He was also having increased sensitivity to light. He had difficult time driving back home. Ever since that time he has had intermittent fluctuating eyelid drooping and photosensitivity. He denies any speech problems, swallowing difficulty, extremity weakness, breathing problems. No similar problems in the past. Patient was evaluated by PCP who ordered MRI brain and MRA head which showed no significant findings. Carotid ultrasound showed no significant stenosis.   REVIEW OF SYSTEMS: Full 14 system review of systems performed and negative with  exception of: memory loss blurred vision hearing loss diff urinating sleep apnea (not on CPAP)   ALLERGIES: No Known Allergies  HOME MEDICATIONS: Outpatient Medications Prior to Visit  Medication Sig Dispense Refill  . allopurinol (ZYLOPRIM) 100 MG tablet Take 100 mg by mouth daily.    Marland Kitchen ALPRAZolam (XANAX) 1 MG tablet Take 1 mg by mouth 2 times daily at 12 noon and 4 pm.     . aspirin EC 81 MG tablet Take 81 mg by mouth daily.    Marland Kitchen atorvastatin (LIPITOR) 40 MG tablet Take 40 mg by mouth daily.    . Cyanocobalamin (B-12 IJ) Inject as directed.    . Furosemide (LASIX PO) Take by mouth.    . gabapentin (NEURONTIN) 300 MG capsule Take 300 mg by mouth 4 (four) times daily as needed.     Marland Kitchen lisinopril (PRINIVIL,ZESTRIL) 40 MG tablet Take by mouth. One half daily    . omeprazole (PRILOSEC) 20 MG capsule Take 20 mg by mouth daily.    Marland Kitchen pyridostigmine (MESTINON) 60 MG tablet Take 0.5-1 tablets (30-60 mg total) by mouth 3 (three) times daily. 270 tablet 4   No facility-administered medications prior to visit.     PAST MEDICAL HISTORY: Past Medical History:  Diagnosis Date  . Barrett esophagus    History of  . GERD (gastroesophageal reflux disease)   . Gout   . History of adenomatous polyp of colon   . Hyperlipidemia   . Hypertension   . Shingles    hx of, on back of neck  . Sleep apnea    Noncompliant with CPAP    PAST SURGICAL HISTORY: Past Surgical History:  Procedure Laterality Date  .  COLONOSCOPY    . COLONOSCOPY  12/09/2004   [id number] polyp at 35 cm biopsied/normal rectum  . COLONOSCOPY   05/20/2008   HQI:ONGEX midsigmoid s/p snare removal/normal rectum. Tubular adenoma  . COLONOSCOPY WITH ESOPHAGOGASTRODUODENOSCOPY (EGD) N/A 03/22/2013   Procedure: COLONOSCOPY WITH ESOPHAGOGASTRODUODENOSCOPY (EGD);  Surgeon: Daneil Dolin, MD;  [facility];  Service: Endoscopy;  Laterality: N/A;  9:30  . ESOPHAGOGASTRODUODENOSCOPY  07/01/2003   BMW:[id number] colored  epithelium involving essentially nearly the distal onehalf of the tubular esophagus/Patulous esophagogastric junction/ Moderate sized hiatal hernia/Otherwise normal stomach, normal D1 and D2.  . ESOPHAGOGASTRODUODENOSCOPY  12/09/2004   [id number] epithelium  beginning at 33 cm consistent with previous diagnosis of Barrett's esophagus  segmentally biopsied. Hiatal hernia. Otherwise normal stomach. Normal D1 and D2  . ESOPHAGOGASTRODUODENOSCOPY   05/20/2008   [id number] epithelium distal esophagus consistent with Barrett's esophagus.  No significant change from prior exam. Multiple biopsies negative for dysplasia.  . TONSILLECTOMY      FAMILY HISTORY: Family History  Problem Relation Age of Onset  . Heart attack Father 4       First MI in 2s  . Cancer Brother        prostate  . Colon cancer Mother        age greater than 2s  . Cancer Sister        brain  . COPD Brother     SOCIAL HISTORY:  Social History   Social History  . Marital status: Married    Spouse name: Merchandiser, retail  . Number of children: 2  . Years of education: 14   Occupational History  . Retired     [place] History Main Topics  . Smoking status: Never Smoker  . Smokeless tobacco: Never Used     Comment: Increased exposure to second hand smoke  . Alcohol use No  . Drug use: No  . Sexual activity: Not on file   Other Topics Concern  . Not on file   Social History Narrative   Lives with wife   2 children, 1 passed away   caffeine- coffee 1-2 daily     PHYSICAL EXAM  GENERAL EXAM/CONSTITUTIONAL: Vitals:  Vitals:   01/12/17 1122  BP: 117/77  Pulse: 66  Weight: 217 lb (98.4 kg)   Body mass index is 37.25 kg/m. No exam data present  Patient is in no distress; well developed, nourished and groomed; neck is supple  CARDIOVASCULAR:  Examination of carotid arteries is normal; no carotid bruits  Regular rate and rhythm, no murmurs  Examination of  peripheral vascular system by observation and palpation is normal  EYES:  Ophthalmoscopic exam of optic discs and posterior segments is normal; no papilledema or hemorrhages  MUSCULOSKELETAL:  Gait, strength, tone, movements noted in Neurologic exam below  NEUROLOGIC: MENTAL STATUS:  No flowsheet data found.  awake, alert, oriented to person, place and time  recent and remote memory intact  normal attention and concentration  language fluent, comprehension intact, naming intact,   fund of knowledge appropriate  CRANIAL NERVE:   2nd - no papilledema on fundoscopic exam  2nd, 3rd, 4th, 6th - pupils equal and reactive to light, visual fields full to confrontation, extraocular muscles intact, no nystagmus  5th - facial sensation symmetric  7th - facial strength symmetric  8th - hearing DECR  9th - palate elevates symmetrically, uvula midline  11th - shoulder shrug symmetric  12th - tongue protrusion midline  MOTOR:  normal bulk and tone, full strength in the BUE, BLE  SENSORY:   normal and symmetric to light touch, temperature, vibration  COORDINATION:   finger-nose-finger, fine finger movements normal  REFLEXES:   deep tendon reflexes TRACE and symmetric  GAIT/STATION:   narrow based gait    DIAGNOSTIC DATA (LABS, IMAGING, TESTING) - I reviewed patient records, labs, notes, testing and imaging myself where available.  Lab Results  Component Value Date   WBC 6.9 05/07/2013   HGB 16.0 05/07/2013   HCT 46.0 05/07/2013   MCV 91.1 05/07/2013   PLT 205 05/07/2013      Component Value Date/Time   NA 139 06/18/2016 0906   K 5.0 06/18/2016 0906   CL 100 06/18/2016 0906   CO2 25 06/18/2016 0906   GLUCOSE 103 (H) 06/18/2016 0906   GLUCOSE 99 05/07/2013 1301   BUN 17 06/18/2016 0906   CREATININE 1.20 10/21/2016 1354   CALCIUM 9.2 06/18/2016 0906   PROT 6.5 08/07/2012 0900   ALBUMIN 3.8 08/07/2012 0900   AST 19 08/07/2012 0900   ALT 22  08/07/2012 0900   ALKPHOS 49 08/07/2012 0900   BILITOT 0.5 08/07/2012 0900   GFRNONAA 65 06/18/2016 0906   GFRAA 75 06/18/2016 0906   Lab Results  Component Value Date   CHOL 131 06/18/2016   HDL 33 (L) 06/18/2016   LDLCALC 77 06/18/2016   TRIG 106 06/18/2016   CHOLHDL 4.0 06/18/2016   Lab Results  Component Value Date   HGBA1C 5.6 07/23/2016   No results found for: VITAMINB12 Lab Results  Component Value Date   TSH 2.730 07/23/2016    AChR Binding Ab, Serum  Date Value Ref Range Status  07/23/2016 12.90 (H) 0.00 - 0.24 nmol/L Final    Comment:    **Results verified by repeat testing**                                Negative:   0.00 - 0.24                                Borderline: 0.25 - 0.40                                Positive:        > 0.40     06/23/16 MRI brain [I reviewed images myself and agree with interpretation. -VRP] - No acute finding. Normal for age. Few small punctate white matter foci, not likely significant.  06/23/16 MRA head [I reviewed images myself and agree with interpretation. -VRP]  - Normal intracranial MR angiography of the large and medium size vessels.  07/15/16 Carotid u/s  1. No evidence of significant carotid stenosis in the neck bilaterally. No evidence of right carotid stenosis. Minimal plaque on the left with estimated left ICA stenosis of less than 50%. 2. Nonvisualization of the right vertebral artery by duplex ultrasound. The left vertebral artery demonstrates antegrade flow.  10/21/16 CT chest  1. No evidence for mediastinal mass lesion. 2. Clear lung fields 3. Hepatic steatosis     ASSESSMENT AND PLAN  71 y.o. year old male here with new onset bilateral ptosis, fluctuating, right worse than left side, since October 2017. AchR antibody positive.    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 pyridostigmine (MESTINON) 60 MG tablet    Sig: Take 1 tablet (60 mg total) by mouth 3 (three) times daily.    Dispense:  270 tablet    Refill:  4   Orders Placed This Encounter  Procedures  . Ambulatory referral to Sleep Studies   Return in about 6 months (around 07/14/2017).    Penni Bombard, MD 5/37/4827, 07:86 AM Certified in Neurology, Neurophysiology and Neuroimaging  [facility] [address] [place], [place] [address] [phone]

## 2017-01-26 ENCOUNTER — Encounter: Payer: Self-pay | Admitting: Family Medicine

## 2017-01-26 ENCOUNTER — Ambulatory Visit (INDEPENDENT_AMBULATORY_CARE_PROVIDER_SITE_OTHER): Payer: Medicare HMO | Admitting: Family Medicine

## 2017-01-26 VITALS — BP 140/86 | Ht 64.0 in | Wt 218.4 lb

## 2017-01-26 DIAGNOSIS — I10 Essential (primary) hypertension: Secondary | ICD-10-CM | POA: Diagnosis not present

## 2017-01-26 DIAGNOSIS — Z0000 Encounter for general adult medical examination without abnormal findings: Secondary | ICD-10-CM

## 2017-01-26 MED ORDER — LISINOPRIL 5 MG PO TABS: ORAL_TABLET | ORAL | 1 refills | Status: DC

## 2017-01-26 NOTE — Progress Notes (Signed)
Subjective:    Patient ID: Bradley [name], male    DOB: 1945/09/25, 71 y.o.   MRN: [id number]  HPI AWV- Annual Wellness Visit  The patient was seen for their annual wellness visit. The patient's past medical history, surgical history, and family history were reviewed. Pertinent vaccines were reviewed ( tetanus, pneumonia, shingles, flu) The patient's medication list was reviewed and updated.  The height and weight were entered. 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 Outcome of Mini - Cog: pass  Falls within the past 6 months:none  Current tobacco usage: none (All patients who use tobacco were given written and verbal information on quitting)  Recent listing of emergency department/hospitalizations over the past year were reviewed.  current [facility] the patient [facility] basis[facility]    Medicare annual wellness visit patient questionnaire was reviewed.  A written screening schedule for the patient for the next 5-10 years was given. Appropriate discussion of followup regarding next visit was discussed.       Review of Systems  Constitutional: Negative for activity change, appetite change and fever.  HENT: Negative for congestion and rhinorrhea.   Eyes: Negative for discharge.  Respiratory: Negative for cough and wheezing.   Cardiovascular: Negative for chest pain.  Gastrointestinal: Negative for abdominal pain, blood in stool and vomiting.  Genitourinary: Negative for difficulty urinating and frequency.  Musculoskeletal: Negative for neck pain.  Skin: Negative for rash.    Allergic/Immunologic: Negative for environmental allergies and food allergies.  Neurological: Negative for weakness and headaches.  Psychiatric/Behavioral: Negative for agitation.       Objective:   Physical Exam  Constitutional: He appears well-developed and well-nourished.  HENT:  Head: Normocephalic and atraumatic.  Right Ear: External ear normal.  Left Ear: External ear normal.  Nose: Nose normal.  Mouth/Throat: Oropharynx is clear and moist.  Eyes: EOM are normal. Pupils are equal, round, and reactive to light.  Neck: Normal range of motion. Neck supple. No thyromegaly present.  Cardiovascular: Normal rate, regular rhythm and normal heart sounds.   No murmur heard. Pulmonary/Chest: Effort normal and breath sounds normal. No respiratory distress. He has no wheezes.  Abdominal: Soft. Bowel sounds are normal. He exhibits no distension and no mass. There is no tenderness.  Genitourinary: Penis normal.  Musculoskeletal: Normal range of motion. He exhibits no edema.  Lymphadenopathy:    He has no cervical adenopathy.  Neurological: He is alert. He exhibits normal muscle tone.  Skin: Skin is warm and dry. No erythema.  Psychiatric: He has a normal mood and affect. His behavior is normal. Judgment normal.  prostate normal        Assessment & Plan:  Adult wellness-complete.wellness physical was conducted today. Importance of diet and exercise were discussed in detail. In addition to this a discussion regarding safety was also covered. We also reviewed over immunizations and gave recommendations regarding current immunization needed for age. In addition to this additional areas were also touched on including: Preventative health exams needed: Colonoscopy 2019  Patient was advised yearly wellness exam  Lab work from the [place] was reviewed PSA normal  HTN-medication too  strong reduce lisinopril 5 mg patient was taught to regarding would like to follow this. [facility] but he would like to follow-up here with this issue therefore reduce lisinopril 5 mg each morning may use a half for whole tablet later in the day of blood pressures are elevated he was encouraged to follow a healthy diet exercise lose weight he was also encouraged to monitor his blood pressure, send [place] readings, follow-up here within 6 months sooner problems, also encouraged to report to [place] if low blood sugars  Note will be sent to his gastroenterologist to see when they recommend a follow-up EGD because of Barrett's esophagus

## 2017-01-26 NOTE — Patient Instructions (Addendum)
Thank you for coming for your annual wellness visit.  Please follow through on any advice that was given to you by today's visit. Remember to maintain compliance with your medications as discussed today.  Also remember it is important to eat a healthy diet and to stay physically active on a daily basis.  Please follow through with any testing or recommended followup office visits as was discussed today. You are due the following test coming up:  ALL- Colonoscopy-2019            Vaccines-Shingrix -new vaccine-  I recommend that you discuss this with the [facility] [place]. It should be able to be covered by them.   Lisinopril 5 mg each morning to help keep your blood pressure under decent control. It is fine check your blood pressure later in the day. Ideally we want to see her blood pressure 130/80 approximately or less. If any problems please let me know. Please follow-up in 6 months. If necessary you may take a second 5 mg lisinopril later in the day if you are having elevated blood pressure. Obviously if you're having low blood pressures it is important for you to let us know.      Finally remembered that the annual wellness visit does not take the place of regularly scheduled office visits  chronic health problems such as hypertension/diabetes/cholesterol visits.

## 2017-07-29 ENCOUNTER — Ambulatory Visit: Payer: Medicare HMO | Admitting: Family Medicine

## 2017-10-11 ENCOUNTER — Ambulatory Visit: Payer: Medicare HMO | Admitting: Diagnostic Neuroimaging

## 2017-10-12 ENCOUNTER — Ambulatory Visit: Payer: Medicare HMO | Admitting: Diagnostic Neuroimaging

## 2018-01-31 ENCOUNTER — Telehealth: Payer: Self-pay | Admitting: Family Medicine

## 2018-01-31 DIAGNOSIS — I10 Essential (primary) hypertension: Secondary | ICD-10-CM

## 2018-01-31 DIAGNOSIS — E782 Mixed hyperlipidemia: Secondary | ICD-10-CM

## 2018-01-31 DIAGNOSIS — Z79899 Other long term (current) drug therapy: Secondary | ICD-10-CM

## 2018-01-31 DIAGNOSIS — Z125 Encounter for screening for malignant neoplasm of prostate: Secondary | ICD-10-CM

## 2018-01-31 NOTE — Telephone Encounter (Signed)
Last labs ordered by dr Nicki Reaper 2016. Uric acie, psa, bmp, lipid, liver

## 2018-01-31 NOTE — Telephone Encounter (Signed)
Patient has an appointment on 02/22/18 with Dr. Nicki Reaper.  He is requesting orders for labs.

## 2018-02-01 NOTE — Telephone Encounter (Signed)
Nurses-we are happy to do lab work-in the [facility]-I would not want to duplicate any lab work that has been done in the past few months.  If that he has not had any lab work in the past few months I recommend metabolic 7, lipid, liver, PSA thank you

## 2018-02-01 NOTE — Telephone Encounter (Signed)
Patient states he has not had any labs drawn as of late. He is aware I have sent in labs for him to go to l/c.

## 2018-02-06 DIAGNOSIS — E782 Mixed hyperlipidemia: Secondary | ICD-10-CM | POA: Diagnosis not present

## 2018-02-06 DIAGNOSIS — I10 Essential (primary) hypertension: Secondary | ICD-10-CM | POA: Diagnosis not present

## 2018-02-06 DIAGNOSIS — Z79899 Other long term (current) drug therapy: Secondary | ICD-10-CM | POA: Diagnosis not present

## 2018-02-06 DIAGNOSIS — Z125 Encounter for screening for malignant neoplasm of prostate: Secondary | ICD-10-CM | POA: Diagnosis not present

## 2018-02-07 ENCOUNTER — Encounter: Payer: Self-pay | Admitting: Family Medicine

## 2018-02-07 LAB — BASIC METABOLIC PANEL WITH GFR
BUN/Creatinine Ratio: 13 (ref 10–24)
BUN: 17 mg/dL (ref 8–27)
CO2: 22 mmol/L (ref 20–29)
Calcium: 9.4 mg/dL (ref 8.6–10.2)
Chloride: 102 mmol/L (ref 96–106)
Creatinine, Ser: 1.28 mg/dL — ABNORMAL HIGH (ref 0.76–1.27)
GFR calc Af Amer: 66 mL/min/1.73
GFR calc non Af Amer: 57 mL/min/1.73 — ABNORMAL LOW
Glucose: 92 mg/dL (ref 65–99)
Potassium: 5.2 mmol/L (ref 3.5–5.2)
Sodium: 141 mmol/L (ref 134–144)

## 2018-02-07 LAB — LIPID PANEL
CHOL/HDL RATIO: 4.2 ratio (ref 0.0–5.0)
Cholesterol, Total: 144 mg/dL (ref 100–199)
HDL: 34 mg/dL — AB (ref >39)
LDL CALC: 84 mg/dL (ref 0–99)
Triglycerides: 129 mg/dL (ref 0–149)
VLDL Cholesterol Cal: 26 mg/dL (ref 5–40)

## 2018-02-07 LAB — HEPATIC FUNCTION PANEL
ALT: 30 IU/L (ref 0–44)
AST: 31 IU/L (ref 0–40)
Albumin: 4.4 g/dL (ref 3.6–4.8)
Alkaline Phosphatase: 64 IU/L (ref 39–117)
Bilirubin Total: 0.7 mg/dL (ref 0.0–1.2)
Bilirubin, Direct: 0.16 mg/dL (ref 0.00–0.40)
Total Protein: 6.5 g/dL (ref 6.0–8.5)

## 2018-02-07 LAB — PSA: Prostate Specific Ag, Serum: 2.4 ng/mL (ref 0.0–4.0)

## 2018-02-22 ENCOUNTER — Telehealth: Payer: Self-pay | Admitting: Family Medicine

## 2018-02-22 ENCOUNTER — Ambulatory Visit (INDEPENDENT_AMBULATORY_CARE_PROVIDER_SITE_OTHER): Payer: Medicare HMO | Admitting: Family Medicine

## 2018-02-22 ENCOUNTER — Encounter: Payer: Self-pay | Admitting: Family Medicine

## 2018-02-22 VITALS — BP 134/86 | Ht 64.0 in | Wt 227.0 lb

## 2018-02-22 DIAGNOSIS — Z0000 Encounter for general adult medical examination without abnormal findings: Secondary | ICD-10-CM | POA: Diagnosis not present

## 2018-02-22 DIAGNOSIS — R350 Frequency of micturition: Secondary | ICD-10-CM | POA: Diagnosis not present

## 2018-02-22 DIAGNOSIS — Z1211 Encounter for screening for malignant neoplasm of colon: Secondary | ICD-10-CM

## 2018-02-22 DIAGNOSIS — N401 Enlarged prostate with lower urinary tract symptoms: Secondary | ICD-10-CM | POA: Insufficient documentation

## 2018-02-22 LAB — POCT URINALYSIS DIPSTICK
PH UA: 5 (ref 5.0–8.0)
Spec Grav, UA: >=1.03 — AB (ref 1.010–1.025)

## 2018-02-22 MED ORDER — TAMSULOSIN HCL 0.4 MG PO CAPS: 0.4000 mg | ORAL_CAPSULE | Freq: Every day | ORAL | 3 refills | Status: DC

## 2018-02-22 NOTE — Progress Notes (Addendum)
Subjective:    Patient ID: Bradley [name], male    DOB: November 02, 1945, 72 y.o.   MRN: [id number]  HPI AWV- Annual Wellness Visit  The patient was seen for their annual wellness visit. The patient's past medical history, surgical history, and family history were reviewed. Pertinent vaccines were reviewed ( tetanus, pneumonia, shingles, flu) The patient's medication list was reviewed and updated.  The height and weight were entered.  BMI recorded in electronic record elsewhere  Cognitive screening was completed. Outcome of Mini - Cog: pass   Falls /depression screening electronically recorded within record elsewhere  Current tobacco usage:no (All patients who use tobacco were given written and verbal information on quitting)  Recent listing of emergency department/hospitalizations over the past year were reviewed.  current specialist the patient sees on a regular basis: not on a regular basis; has seen neurologist   Medicare annual wellness visit patient questionnaire was reviewed.  A written screening schedule for the patient for the next 5-10 years was given. Appropriate discussion of followup regarding next visit was discussed.  Results for orders placed or performed in visit on 02/22/18  POCT urinalysis dipstick  Result Value Ref Range   Color, UA     Clarity, UA     Glucose, UA  Negative   Bilirubin, UA +    Ketones, UA     Spec Grav, UA >=1.030 (A) 1.010 - 1.025   Blood, UA     pH, UA 5.0 5.0 - 8.0   Protein, UA  Negative   Urobilinogen, UA  0.2 or 1.0 E.U./dL   Nitrite, UA     Leukocytes, UA Trace (A) Negative   Appearance     Odor    Patient with slight reduced- Flow of urine we have recently did PSA which looks good we will try a trial of Flomax Patient states he does have to get up once or twice at night and has reduced flow of urine in the morning We talked about this at length Denies hematuria abdominal pain or flank pain Recent PSA looks good Process  of BPH explained to the patient  Review of Systems  Constitutional: Negative for activity change, appetite change and fever.  HENT: Negative for congestion and rhinorrhea.   Eyes: Negative for discharge.  Respiratory: Negative for cough and wheezing.   Cardiovascular: Negative for chest pain.  Gastrointestinal: Negative for abdominal pain, blood in stool and vomiting.  Genitourinary: Negative for difficulty urinating and frequency.  Musculoskeletal: Negative for neck pain.  Skin: Negative for rash.  Allergic/Immunologic: Negative for environmental allergies and food allergies.  Neurological: Negative for weakness and headaches.  Psychiatric/Behavioral: Negative for agitation.       Objective:   Physical Exam  Constitutional: He appears well-developed and well-nourished.  HENT:  Head: Normocephalic and atraumatic.  Right Ear: External ear normal.  Left Ear: External ear normal.  Nose: Nose normal.  Mouth/Throat: Oropharynx is clear and moist.  Eyes: Pupils are equal, round, and reactive to light. EOM are normal.  Neck: Normal range of motion. Neck supple. No thyromegaly present.  Cardiovascular: Normal rate, regular rhythm and normal heart sounds.  No murmur heard. Pulmonary/Chest: Effort normal and breath sounds normal. No respiratory distress. He has no wheezes.  Abdominal: Soft. Bowel sounds are normal. He exhibits no distension and no mass. There is no tenderness.  Genitourinary: Penis normal.  Musculoskeletal: Normal range of motion. He exhibits no edema.  Lymphadenopathy:    He has no cervical adenopathy.  Neurological: He is alert. He exhibits normal muscle tone.  Skin: Skin is warm and dry. No erythema.  Psychiatric: He has a normal mood and affect. His behavior is normal. Judgment normal.    Prostate slightly enlarged not hard not tender Labs reviewed with the patient Slight renal insufficiency   The patient has mild BPH with reduced flow and has to get up at  night to urinate we did discuss Flomax he is interested in trying Assessment & Plan:  Very nice patient  Multiple chronic health problems managed by the [facility] I did inform the patient that it is important for him to make sure he talks with the [facility] to make sure they are doing all the proper blood work We do not have access to this The responsibility for proper blood work and management will be through the [place] which is been by this patient's choice Wellness-overall doing good today patient was encouraged to watch diet exercise try to lose some weight We will also try Flomax to see if it will help his mild BPH and urinary flow issues patient with slight reduced  Adult wellness-complete.wellness physical was conducted today. Importance of diet and exercise were discussed in detail.  In addition to this a discussion regarding safety was also covered. We also reviewed over immunizations and gave recommendations regarding current immunization needed for age.  In addition to this additional areas were also touched on including: Preventative health exams needed:  Colonoscopy referred for colonoscopy  Patient was advised yearly wellness exam

## 2018-02-22 NOTE — Telephone Encounter (Signed)
Pt's wife calling back in with the fax number for the [place] for pt's scripts to go to. Fax# [phone].

## 2018-02-23 ENCOUNTER — Encounter: Payer: Self-pay | Admitting: Internal Medicine

## 2018-02-23 ENCOUNTER — Other Ambulatory Visit: Payer: Self-pay | Admitting: *Deleted

## 2018-02-23 MED ORDER — TAMSULOSIN HCL 0.4 MG PO CAPS: 0.4000 mg | ORAL_CAPSULE | Freq: Every day | ORAL | 3 refills | Status: DC

## 2018-02-23 NOTE — Telephone Encounter (Signed)
rx faxed

## 2018-02-23 NOTE — Telephone Encounter (Signed)
Please fax prescription as requested

## 2018-06-01 ENCOUNTER — Ambulatory Visit (INDEPENDENT_AMBULATORY_CARE_PROVIDER_SITE_OTHER): Payer: Medicare HMO | Admitting: Gastroenterology

## 2018-06-01 ENCOUNTER — Encounter: Payer: Self-pay | Admitting: Gastroenterology

## 2018-06-01 ENCOUNTER — Other Ambulatory Visit: Payer: Self-pay

## 2018-06-01 VITALS — BP 129/81 | HR 58 | Temp 97.1°F | Ht 64.0 in | Wt 226.2 lb

## 2018-06-01 DIAGNOSIS — Z800 Family history of malignant neoplasm of digestive organs: Secondary | ICD-10-CM | POA: Diagnosis not present

## 2018-06-01 DIAGNOSIS — K2270 Barrett's esophagus without dysplasia: Secondary | ICD-10-CM

## 2018-06-01 DIAGNOSIS — Z86010 Personal history of colonic polyps: Secondary | ICD-10-CM | POA: Diagnosis not present

## 2018-06-01 NOTE — Progress Notes (Signed)
Primary Care Physician:  Kathyrn Drown, MD  Primary Gastroenterologist:  Garfield Cornea, MD   Chief Complaint  Patient presents with  . Colonoscopy    last tcs 2014  . Gastroesophageal Reflux    HPI:  Bradley [name] is a 72 y.o. male here to schedule surveillance EGD for history of Barrett's, 5-year follow-up colonoscopy.  Patient has a history of chronic GERD, occasional nocturnal breakthrough symptoms whenever he eats trigger foods.  Denies dysphagia.  No abdominal pain.  Daily bowel movement, occasional hard stools.  Uses stool softener as needed 1 or 2 times monthly.  No melena or rectal bleeding.  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  Doing very well.    Current Outpatient Medications  Medication Sig Dispense Refill  . allopurinol (ZYLOPRIM) 100 MG tablet Take 100 mg by mouth daily.    Marland Kitchen ALPRAZolam (XANAX) 1 MG tablet Take 1 mg by mouth 2 times daily at 12 noon and 4 pm.     . aspirin EC 81 MG tablet Take 81 mg by mouth daily.    Marland Kitchen atorvastatin (LIPITOR) 40 MG tablet Take 40 mg by mouth daily.    . Cyanocobalamin (B-12 IJ) Inject as directed.    . diphenhydrAMINE (BENADRYL) 25 MG tablet Take 25 mg by mouth every 6 (six) hours as needed.    . Furosemide (LASIX PO) Take by mouth.    . gabapentin (NEURONTIN) 300 MG capsule Take 300 mg by mouth 4 (four) times daily as needed.     Marland Kitchen lisinopril (PRINIVIL,ZESTRIL) 5 MG tablet 1 bid as directed 180 tablet 1  . omeprazole (PRILOSEC) 20 MG capsule Take 20 mg by mouth daily.    Marland Kitchen pyridostigmine (MESTINON) 60 MG tablet Take 1 tablet (60 mg total) by mouth 3 (three) times daily. 270 tablet 4  . tamsulosin (FLOMAX) 0.4 MG CAPS capsule Take 1 capsule (0.4 mg total) by mouth daily. 90 capsule 3   No current facility-administered medications for this visit.     Allergies as of 06/01/2018  . (No Known Allergies)    Past Medical History:  Diagnosis Date  . Barrett esophagus     History of  . GERD (gastroesophageal reflux disease)   . Gout   . History of adenomatous polyp of colon   . Hyperlipidemia   . Hypertension   . Shingles    hx of, on back of neck  . Sleep apnea    Noncompliant with CPAP    Past Surgical History:  Procedure Laterality Date  . COLONOSCOPY    . COLONOSCOPY  12/09/2004   YIR:SWNIOEVOJJ polyp at 35 cm biopsied/normal rectum  . COLONOSCOPY   05/20/2008   KKX:FGHWE midsigmoid s/p snare removal/normal rectum. Tubular adenoma  . COLONOSCOPY WITH ESOPHAGOGASTRODUODENOSCOPY (EGD) N/A 03/22/2013   Dr. Gala Romney: Barrett's esophagus status post segmental biopsies with no evidence of dysplasia.  Normal colonoscopy.  Next colonoscopy in 5 years.  . ESOPHAGOGASTRODUODENOSCOPY  07/01/2003   XHB:ZJIRCV colored epithelium involving essentially nearly the distal onehalf of the tubular esophagus/Patulous esophagogastric junction/ Moderate sized hiatal hernia/Otherwise normal stomach, normal D1 and D2.  . ESOPHAGOGASTRODUODENOSCOPY  12/09/2004   ELF:YBOFBP-ZWCHENI epithelium  beginning at 33 cm consistent with previous diagnosis of Barrett's esophagus  segmentally biopsied. Hiatal hernia. Otherwise normal stomach. Normal D1 and D2  . ESOPHAGOGASTRODUODENOSCOPY   05/20/2008   DPO:EUMPNT-IRWERXV epithelium distal esophagus consistent with Barrett's esophagus.  No significant change from prior exam. Multiple biopsies negative  for dysplasia.  . TONSILLECTOMY      Family History  Problem Relation Age of Onset  . Heart attack Father 23       First MI in 44s  . Cancer Brother        prostate  . Colon cancer Mother        age greater than 71s  . Cancer Sister        brain  . COPD Brother     Social History   Socioeconomic History  . Marital status: Married    Spouse name: Merchandiser, retail  . Number of children: 2  . Years of education: 64  . Highest education level: Not on file  Occupational History  . Occupation: Retired    Comment: [employer] [place]  . Financial resource strain: Not on file  . Food insecurity:    Worry: Not on file    Inability: Not on file  . Transportation needs:    Medical: Not on file    Non-medical: Not on file  Tobacco Use  . Smoking status: Never Smoker  . Smokeless tobacco: Never Used  . Tobacco comment: Increased exposure to second hand smoke  Substance and Sexual Activity  . Alcohol use: No  . Drug use: No  . Sexual activity: Not on file  Lifestyle  . Physical activity:    Days per week: Not on file    Minutes per session: Not on file  . Stress: Not on file  Relationships  . Social connections:    Talks on phone: Not on file    Gets together: Not on file    Attends religious service: Not on file    Active member of club or organization: Not on file    Attends meetings of clubs or organizations: Not on file    Relationship status: Not on file  . Intimate partner violence:    Fear of current or ex partner: Not on file    Emotionally abused: Not on file    Physically abused: Not on file    Forced sexual activity: Not on file  Other Topics Concern  . Not on file  Social History Narrative   Lives with wife   2 children, 1 passed away   caffeine- coffee 1-2 daily      ROS:  General: Negative for anorexia, weight loss, fever, chills, fatigue, weakness. Eyes: Negative for vision changes.  ENT: Negative for hoarseness, difficulty swallowing , nasal congestion. CV: Negative for chest pain, angina, palpitations, dyspnea on exertion, peripheral edema.  Respiratory: Negative for dyspnea at rest, dyspnea on exertion, cough, sputum, wheezing.  GI: See history of present illness. GU:  Negative for dysuria, hematuria, urinary incontinence, urinary frequency, nocturnal urination.  MS: Negative for joint pain, low back pain.  Derm: Negative for rash or itching.  Neuro: Negative for weakness, abnormal sensation, seizure, frequent headaches, memory loss, confusion.  Psych: Negative for  anxiety, depression, suicidal ideation, hallucinations.  Endo: Negative for unusual weight change.  Heme: Negative for bruising or bleeding. Allergy: Negative for rash or hives.    Physical Examination:  BP 129/81   Pulse (!) 58   Temp (!) 97.1 F (36.2 C) (Oral)   Ht 5\' 4"  (1.626 m)   Wt 226 lb 3.2 oz (102.6 kg)   BMI 38.83 kg/m    General: Well-nourished, well-developed in no acute distress.  Head: Normocephalic, atraumatic.   Eyes: Conjunctiva pink, no icterus. Mouth: Oropharyngeal mucosa moist and  pink , no lesions erythema or exudate. Neck: Supple without thyromegaly, masses, or lymphadenopathy.  Lungs: Clear to auscultation bilaterally.  Heart: Regular rate and rhythm, no murmurs rubs or gallops.  Abdomen: Bowel sounds are normal, nontender, nondistended, no hepatosplenomegaly or masses, no abdominal bruits or    hernia , no rebound or guarding.   Rectal: Deferred Extremities: No lower extremity edema. No clubbing or deformities.  Neuro: Alert and oriented x 4 , grossly normal neurologically.  Skin: Warm and dry, no rash or jaundice.   Psych: Alert and cooperative, normal mood and affect.

## 2018-06-01 NOTE — Assessment & Plan Note (Signed)
Patient is due for surveillance colonoscopy.  Mother had colon cancer, greater than age 70.  Patient has had colonic adenomas past.  Last colonoscopy was clear.  Plan for conscious sedation (he did well before), patient declines propofol.  I have discussed the risks, alternatives, benefits with regards to but not limited to the risk of reaction to medication, bleeding, infection, perforation and the patient is agreeable to proceed. Written consent to be obtained.

## 2018-06-01 NOTE — Assessment & Plan Note (Signed)
Due for surveillance EGD for history of Barrett's esophagus.  Patient has done well with conscious sedation in the past.  He reports that he has decreased his Xanax use since we last saw him.  We discussed possible propofol the patient request conscious sedation as before.  I have discussed the risks, alternatives, benefits with regards to but not limited to the risk of reaction to medication, bleeding, infection, perforation and the patient is agreeable to proceed. Written consent to be obtained.

## 2018-06-01 NOTE — Patient Instructions (Signed)
1. Colonoscopy and upper endoscopy as scheduled. Please see separate instructions. 

## 2018-06-05 NOTE — Progress Notes (Signed)
cc'ed to pcp °

## 2018-08-01 ENCOUNTER — Telehealth: Payer: Self-pay | Admitting: Internal Medicine

## 2018-08-01 NOTE — Telephone Encounter (Signed)
Pt called to say his procedure is for tomorrow with RMR and he has talked to his insurance co and was told to call us to let us know that the procedure needed to be coded as a screening. He said he wanted to talk to someone about this because last time he said it took 6 months to get his bill straightened out. I told him the scheduler/nurse was with another patient and I would send her a message. Please call him at [phone]

## 2018-08-01 NOTE — Telephone Encounter (Signed)
TCS is for history of polyps.  Called and informed pt TCS is for history of polyps and would not be coded as screening. Pt felt it should be coded as screening since he didn't have polyps on last TCS. Explained to pt he has a history of polyps. He wanted to cancel procedure if insurance wouldn't cover 100%. Asked him if he wanted to proceed with EGD, he stated no to cancel it all.  LMOVM for endo scheduler to cancel procedure.

## 2018-08-02 ENCOUNTER — Other Ambulatory Visit: Payer: Self-pay

## 2018-08-02 ENCOUNTER — Ambulatory Visit (HOSPITAL_COMMUNITY)
Admission: RE | Admit: 2018-08-02 | Discharge: 2018-08-02 | Disposition: A | Discharge status: Home / Self Care | Payer: Medicare HMO | Source: Ambulatory Visit | Attending: Internal Medicine | Admitting: Internal Medicine

## 2018-08-02 ENCOUNTER — Encounter (HOSPITAL_COMMUNITY)
Admission: RE | Disposition: A | Discharge status: Home / Self Care | Payer: Self-pay | Source: Ambulatory Visit | Attending: Internal Medicine

## 2018-08-02 ENCOUNTER — Encounter (HOSPITAL_COMMUNITY): Admission: RE | Payer: Self-pay | Source: Ambulatory Visit

## 2018-08-02 ENCOUNTER — Ambulatory Visit (HOSPITAL_COMMUNITY): Admission: RE | Admit: 2018-08-02 | Payer: Medicare HMO | Source: Ambulatory Visit | Admitting: Internal Medicine

## 2018-08-02 ENCOUNTER — Encounter (HOSPITAL_COMMUNITY): Payer: Self-pay | Admitting: *Deleted

## 2018-08-02 DIAGNOSIS — Z800 Family history of malignant neoplasm of digestive organs: Secondary | ICD-10-CM

## 2018-08-02 DIAGNOSIS — D128 Benign neoplasm of rectum: Secondary | ICD-10-CM | POA: Diagnosis not present

## 2018-08-02 DIAGNOSIS — Z79899 Other long term (current) drug therapy: Secondary | ICD-10-CM | POA: Diagnosis not present

## 2018-08-02 DIAGNOSIS — Z86010 Personal history of colonic polyps: Secondary | ICD-10-CM | POA: Insufficient documentation

## 2018-08-02 DIAGNOSIS — K2270 Barrett's esophagus without dysplasia: Secondary | ICD-10-CM

## 2018-08-02 DIAGNOSIS — K5730 Diverticulosis of large intestine without perforation or abscess without bleeding: Secondary | ICD-10-CM | POA: Insufficient documentation

## 2018-08-02 DIAGNOSIS — D123 Benign neoplasm of transverse colon: Secondary | ICD-10-CM | POA: Diagnosis not present

## 2018-08-02 DIAGNOSIS — Z7982 Long term (current) use of aspirin: Secondary | ICD-10-CM | POA: Diagnosis not present

## 2018-08-02 DIAGNOSIS — Z1211 Encounter for screening for malignant neoplasm of colon: Secondary | ICD-10-CM | POA: Insufficient documentation

## 2018-08-02 DIAGNOSIS — G4730 Sleep apnea, unspecified: Secondary | ICD-10-CM | POA: Insufficient documentation

## 2018-08-02 DIAGNOSIS — K219 Gastro-esophageal reflux disease without esophagitis: Secondary | ICD-10-CM | POA: Insufficient documentation

## 2018-08-02 DIAGNOSIS — K449 Diaphragmatic hernia without obstruction or gangrene: Secondary | ICD-10-CM | POA: Diagnosis not present

## 2018-08-02 DIAGNOSIS — E785 Hyperlipidemia, unspecified: Secondary | ICD-10-CM | POA: Diagnosis not present

## 2018-08-02 DIAGNOSIS — K3189 Other diseases of stomach and duodenum: Secondary | ICD-10-CM | POA: Diagnosis not present

## 2018-08-02 DIAGNOSIS — K621 Rectal polyp: Secondary | ICD-10-CM

## 2018-08-02 DIAGNOSIS — K317 Polyp of stomach and duodenum: Secondary | ICD-10-CM | POA: Diagnosis not present

## 2018-08-02 DIAGNOSIS — Z9119 Patient's noncompliance with other medical treatment and regimen: Secondary | ICD-10-CM | POA: Insufficient documentation

## 2018-08-02 DIAGNOSIS — I10 Essential (primary) hypertension: Secondary | ICD-10-CM | POA: Diagnosis not present

## 2018-08-02 DIAGNOSIS — M109 Gout, unspecified: Secondary | ICD-10-CM | POA: Diagnosis not present

## 2018-08-02 HISTORY — PX: POLYPECTOMY: SHX5525

## 2018-08-02 HISTORY — PX: BIOPSY: SHX5522

## 2018-08-02 HISTORY — PX: COLONOSCOPY: SHX5424

## 2018-08-02 HISTORY — PX: ESOPHAGOGASTRODUODENOSCOPY: SHX5428

## 2018-08-02 SURGERY — COLONOSCOPY
Anesthesia: Moderate Sedation

## 2018-08-02 MED ORDER — ONDANSETRON HCL 4 MG/2ML IJ SOLN
INTRAMUSCULAR | Status: AC
  Filled 2018-08-02: qty 2

## 2018-08-02 MED ORDER — MIDAZOLAM HCL 5 MG/5ML IJ SOLN
INTRAMUSCULAR | Status: DC | PRN
  Administered 2018-08-02 (×2): 2 mg via INTRAVENOUS
  Administered 2018-08-02 (×2): 1 mg via INTRAVENOUS
  Administered 2018-08-02: 2 mg via INTRAVENOUS

## 2018-08-02 MED ORDER — STERILE WATER FOR IRRIGATION IR SOLN
Status: DC | PRN
  Administered 2018-08-02: 1.5 mL

## 2018-08-02 MED ORDER — MEPERIDINE HCL 100 MG/ML IJ SOLN
INTRAMUSCULAR | Status: DC | PRN
  Administered 2018-08-02: 15 mg via INTRAVENOUS
  Administered 2018-08-02: 25 mg via INTRAVENOUS
  Administered 2018-08-02: 10 mg via INTRAVENOUS

## 2018-08-02 MED ORDER — SODIUM CHLORIDE 0.9 % IV SOLN
INTRAVENOUS | Status: DC
  Administered 2018-08-02: 13:00:00 via INTRAVENOUS

## 2018-08-02 MED ORDER — SODIUM CHLORIDE 0.9 % IV SOLN: INTRAVENOUS | Status: DC

## 2018-08-02 MED ORDER — LIDOCAINE VISCOUS HCL 2 % MT SOLN
OROMUCOSAL | Status: AC
  Filled 2018-08-02: qty 15

## 2018-08-02 MED ORDER — ONDANSETRON HCL 4 MG/2ML IJ SOLN
INTRAMUSCULAR | Status: DC | PRN
  Administered 2018-08-02: 4 mg via INTRAVENOUS

## 2018-08-02 MED ORDER — MEPERIDINE HCL 50 MG/ML IJ SOLN
INTRAMUSCULAR | Status: AC
  Filled 2018-08-02: qty 1

## 2018-08-02 MED ORDER — MIDAZOLAM HCL 5 MG/5ML IJ SOLN
INTRAMUSCULAR | Status: AC
  Filled 2018-08-02: qty 10

## 2018-08-02 MED ORDER — LIDOCAINE VISCOUS HCL 2 % MT SOLN
OROMUCOSAL | Status: DC | PRN
  Administered 2018-08-02: 1 via OROMUCOSAL

## 2018-08-02 NOTE — Telephone Encounter (Signed)
Noted  

## 2018-08-02 NOTE — Op Note (Signed)
[facility] Patient Name: Bradley [name] Procedure Date: 08/02/2018 11:58 AM MRN: [id number] Date of Birth: [birth year] Attending MD: Norvel Richards , MD CSN: [id number] Age: 73 Admit Type: Ambulatory Procedure:                Upper GI endoscopy Indications:              Surveillance for malignancy due to personal history                            of Barrett's esophagus Providers:                Norvel Richards, MD, Charlsie Quest. Theda Sers RN, RN,                            Lurline Del, RN Referring MD:              Medicines:                Midazolam 5 mg IV, Meperidine 40 mg IV Complications:            No immediate complications. Estimated Blood Loss:     Estimated blood loss was minimal. Procedure:                Pre-Anesthesia Assessment:                           - Prior to the procedure, a History and Physical                            was performed, and patient medications and                            allergies were reviewed. The patient's tolerance of                            previous anesthesia was also reviewed. The risks                            and benefits of the procedure and the sedation                            options and risks were discussed with the patient.                            All questions were answered, and informed consent                            was obtained. Prior Anticoagulants: The patient has                            taken no previous anticoagulant or antiplatelet                            agents. ASA Grade Assessment: II - A patient with  mild systemic disease. After reviewing the risks                            and benefits, the patient was deemed in                            satisfactory condition to undergo the procedure.                           After obtaining informed consent, the endoscope was                            passed under direct vision. Throughout the    procedure, the patient's blood pressure, pulse, and                            oxygen saturations were monitored continuously. The                            GIF-H190 ([id number]) scope was introduced through the                            mouth, and advanced to the second part of duodenum.                            The upper GI endoscopy was accomplished without                            difficulty. The patient tolerated the procedure                            well. Scope In: 1:12:52 PM Scope Out: 1:24:40 PM Total Procedure Duration: 0 hours 11 minutes 48 seconds  Findings:      Circumferential salmon-colored mucosa was present at 29 cm all the way       down to the GE junction (39 cm). No nodularity. No esophagitis. Mucosa       was biopsied with a cold forceps for histology in 4 quadrants at       intervals of 2 cm. A total of 5 specimen bottles were sent to pathology.       Estimated blood loss was minimal.      A small hiatal hernia was present. Multiple hyperplastic appearing       polyps are present in the stomach. No ulcer or infiltrating process       seen. This was biopsied with a cold forceps for histology. Estimated       blood loss was minimal.      The duodenal bulb and second portion of the duodenum were normal. Impression:               - Salmon-colored mucosa consistent with prior                            diagnosis of Barrett's esophagus. Biopsied.                           -  Small hiatal hernia. Gastric polyps?"biopsied                           - Normal duodenal bulb and second portion of the                            duodenum. Moderate Sedation:      Moderate (conscious) sedation was administered by the endoscopy nurse       and supervised by the endoscopist. The following parameters were       monitored: oxygen saturation, heart rate, blood pressure, respiratory       rate, EKG, adequacy of pulmonary ventilation, and response to care.       Total physician  intraservice time was 20 minutes. Recommendation:           - Patient has a contact number available for                            emergencies. The signs and symptoms of potential                            delayed complications were discussed with the                            patient. Return to normal activities tomorrow.                            Written discharge instructions were provided to the                            patient.                           - Resume previous diet.                           - Continue present medications.                           - Await pathology results.                           - Repeat upper endoscopy at appointment to be                            scheduled for surveillance based on pathology                            results.                           - Return to GI clinic (date not yet determined).                            See colonoscopy report. Procedure Code(s):        --- Professional ---  91638, Esophagogastroduodenoscopy, flexible,                            transoral; with biopsy, single or multiple                           G0500, Moderate sedation services provided by the                            same physician or other qualified health care                            professional performing a gastrointestinal                            endoscopic service that sedation supports,                            requiring the presence of an independent trained                            observer to assist in the monitoring of the                            patient's level of consciousness and physiological                            status; initial 15 minutes of intra-service time;                            patient age 37 years or older (additional time may                            be reported with [phone], as appropriate) Diagnosis Code(s):        --- Professional ---                           K22.70,  Barrett's esophagus without dysplasia                           K44.9, Diaphragmatic hernia without obstruction or                            gangrene CPT copyright 2018 [employer]. All rights reserved. The codes documented in this report are preliminary and upon coder review may  be revised to meet current compliance requirements. Cristopher Estimable. Rourk, MD Norvel Richards, MD 08/02/2018 1:32:54 PM This report has been signed electronically. Number of Addenda: 0

## 2018-08-02 NOTE — Discharge Instructions (Signed)
Heartburn Heartburn is a type of pain or discomfort that can happen in the throat or chest. It is often described as a burning pain. It may also cause a bad, acid-like taste in the mouth. Heartburn may feel worse when you lie down or bend over, and it is often worse at night. Heartburn may be caused by stomach contents that move back up into the esophagus (reflux). Follow these instructions at home: Eating and drinking   Avoid certain foods and drinks as told by your health care provider. This may include: ? Coffee and tea (with or without caffeine). ? Drinks that contain alcohol. ? Energy drinks and sports drinks. ? Carbonated drinks or sodas. ? Chocolate and cocoa. ? Peppermint and mint flavorings. ? Garlic and onions. ? Horseradish. ? Spicy and acidic foods, including peppers, chili powder, curry powder, vinegar, hot sauces, and barbecue sauce. ? Citrus fruit juices and citrus fruits, such as oranges, lemons, and limes. ? Tomato-based foods, such as red sauce, chili, salsa, and pizza with red sauce. ? Fried and fatty foods, such as donuts, french fries, potato chips, and high-fat dressings. ? High-fat meats, such as hot dogs and fatty cuts of red and white meats, such as rib eye steak, sausage, ham, and bacon. ? High-fat dairy items, such as whole milk, butter, and cream cheese.  Eat small, frequent meals instead of large meals.  Avoid drinking large amounts of liquid with your meals.  Avoid eating meals during the 2-3 hours before bedtime.  Avoid lying down right after you eat.  Do not exercise right after you eat. Lifestyle      If you are overweight, reduce your weight to an amount that is healthy for you. Ask your health care provider for guidance about a safe weight loss goal.  Do not use any products that contain nicotine or tobacco, such as cigarettes, e-cigarettes, and chewing tobacco. These can make your symptoms worse. If you need help quitting, ask your health care  provider.  Wear loose-fitting clothing. Do not wear anything tight around your waist that causes pressure on your abdomen.  Raise (elevate) the head of your bed about 6 inches (15 cm) when you sleep.  Try to reduce your stress, such as with yoga or meditation. If you need help reducing stress, ask your health care provider. General instructions  Pay attention to any changes in your symptoms.  Take over-the-counter and prescription medicines only as told by your health care provider. ? Do not take aspirin, ibuprofen, or other NSAIDs unless your health care provider told you to do so. ? Stop medicines only as told by your health care provider. If you stop taking some medicines too quickly, your symptoms may get worse.  Keep all follow-up visits as told by your health care provider. This is important. Contact a health care provider if:  You have new symptoms.  You have unexplained weight loss.  You have difficulty swallowing, or it hurts to swallow.  You have wheezing or a persistent cough.  Your symptoms do not improve with treatment.  You have frequent heartburn for more than 2 weeks. Get help right away if:  You have pain in your arms, neck, jaw, teeth, or back.  You feel sweaty, dizzy, or light-headed.  You have chest pain or shortness of breath.  You vomit and your vomit looks like blood or coffee grounds.  Your stool is bloody or black. These symptoms may represent a serious problem that is an emergency. Do not  wait to see if the symptoms will go away. Get medical help right away. Call your local emergency services (911 in the [place] Do not drive yourself to the hospital. Summary  Heartburn is a type of pain or discomfort that can happen in the throat or chest. It is often described as a burning pain. It may also cause a bad, acid-like taste in the mouth.  Avoid certain foods and drinks as told by your health care provider.  Take over-the-counter and prescription  medicines only as told by your health care provider. Do not take aspirin, ibuprofen, or other NSAIDs unless your health care provider told you to do so.  Contact a health care provider if your symptoms do not improve or they get worse. This information is not intended to replace advice given to you by your health care provider. Make sure you discuss any questions you have with your health care provider. Document Released: 11/21/2008 Document Revised: 12/05/2017 Document Reviewed: 12/05/2017 Elsevier Interactive Patient Education  2019 [place]. Colon Polyps  Polyps are tissue growths inside the body. Polyps can grow in many places, including the large intestine (colon). A polyp may be a round bump or a mushroom-shaped growth. You could have one polyp or several. Most colon polyps are noncancerous (benign). However, some colon polyps can become cancerous over time. Finding and removing the polyps early can help prevent this. What are the causes? The exact cause of colon polyps is not known. What increases the risk? You are more likely to develop this condition if you:  Have a family history of colon cancer or colon polyps.  Are older than 94 or older than 45 if you are African American.  Have inflammatory bowel disease, such as ulcerative colitis or Crohn's disease.  Have certain hereditary conditions, such as: ? Familial adenomatous polyposis. ? Lynch syndrome. ? Turcot syndrome. ? Peutz-Jeghers syndrome.  Are overweight.  Smoke cigarettes.  Do not get enough exercise.  Drink too much alcohol.  Eat a diet that is high in fat and red meat and low in fiber.  Had childhood cancer that was treated with abdominal radiation. What are the signs or symptoms? Most polyps do not cause symptoms. If you have symptoms, they may include:  Blood coming from your rectum when having a bowel movement.  Blood in your stool. The stool may look dark red or black.  Abdominal pain.  A  change in bowel habits, such as constipation or diarrhea. How is this diagnosed? This condition is diagnosed with a colonoscopy. This is a procedure in which a lighted, flexible scope is inserted into the anus and then passed into the colon to examine the area. Polyps are sometimes found when a colonoscopy is done as part of routine cancer screening tests. How is this treated? Treatment for this condition involves removing any polyps that are found. Most polyps can be removed during a colonoscopy. Those polyps will then be tested for cancer. Additional treatment may be needed depending on the results of testing. Follow these instructions at home: Lifestyle  Maintain a healthy weight, or lose weight if recommended by your health care provider.  Exercise every day or as told by your health care provider.  Do not use any products that contain nicotine or tobacco, such as cigarettes and e-cigarettes. If you need help quitting, ask your health care provider.  If you drink alcohol, limit how much you have: ? 0-1 drink a day for women. ? 0-2 drinks a day for  men.  Be aware of how much alcohol is in your drink. In the [place] one drink equals one 12 oz bottle of beer (355 mL), one 5 oz glass of wine (148 mL), or one 1 oz shot of hard liquor (44 mL). Eating and drinking   Eat foods that are high in fiber, such as fruits, vegetables, and whole grains.  Eat foods that are high in calcium and vitamin D, such as milk, cheese, yogurt, eggs, liver, fish, and broccoli.  Limit foods that are high in fat, such as fried foods and desserts.  Limit the amount of red meat and processed meat you eat, such as hot dogs, sausage, bacon, and lunch meats. General instructions  Keep all follow-up visits as told by your health care provider. This is important. ? This includes having regularly scheduled colonoscopies. ? Talk to your health care provider about when you need a colonoscopy. Contact a health care  provider if:  You have new or worsening bleeding during a bowel movement.  You have new or increased blood in your stool.  You have a change in bowel habits.  You lose weight for no known reason. Summary  Polyps are tissue growths inside the body. Polyps can grow in many places, including the colon.  Most colon polyps are noncancerous (benign), but some can become cancerous over time.  This condition is diagnosed with a colonoscopy.  Treatment for this condition involves removing any polyps that are found. Most polyps can be removed during a colonoscopy. This information is not intended to replace advice given to you by your health care provider. Make sure you discuss any questions you have with your health care provider. Document Released: 03/31/2004 Document Revised: 10/20/2017 Document Reviewed: 10/20/2017 Elsevier Interactive Patient Education  2019 [place].  Colonoscopy Discharge Instructions  Read the instructions outlined below and refer to this sheet in the next few weeks. These discharge instructions provide you with general information on caring for yourself after you leave the hospital. Your doctor may also give you specific instructions. While your treatment has been planned according to the most current medical practices available, unavoidable complications occasionally occur. If you have any problems or questions after discharge, call Dr. Gala Romney at [phone]. ACTIVITY  You may resume your regular activity, but move at a slower pace for the next 24 hours.   Take frequent rest periods for the next 24 hours.   Walking will help get rid of the air and reduce the bloated feeling in your belly (abdomen).   No driving for 24 hours (because of the medicine (anesthesia) used during the test).    Do not sign any important legal documents or operate any machinery for 24 hours (because of the anesthesia used during the test).  NUTRITION  Drink plenty of fluids.   You  may resume your normal diet as instructed by your doctor.   Begin with a light meal and progress to your normal diet. Heavy or fried foods are harder to digest and may make you feel sick to your stomach (nauseated).   Avoid alcoholic beverages for 24 hours or as instructed.  MEDICATIONS  You may resume your normal medications unless your doctor tells you otherwise.  WHAT YOU CAN EXPECT TODAY  Some feelings of bloating in the abdomen.   Passage of more gas than usual.   Spotting of blood in your stool or on the toilet paper.  IF YOU HAD POLYPS REMOVED DURING THE COLONOSCOPY:  No aspirin products for  7 days or as instructed.   No alcohol for 7 days or as instructed.   Eat a soft diet for the next 24 hours.  FINDING OUT THE RESULTS OF YOUR TEST Not all test results are available during your visit. If your test results are not back during the visit, make an appointment with your caregiver to find out the results. Do not assume everything is normal if you have not heard from your caregiver or the medical facility. It is important for you to follow up on all of your test results.  SEEK IMMEDIATE MEDICAL ATTENTION IF:  You have more than a spotting of blood in your stool.   Your belly is swollen (abdominal distention).   You are nauseated or vomiting.   You have a temperature over 101.   You have abdominal pain or discomfort that is severe or gets worse throughout the day.  EGD Discharge instructions Please read the instructions outlined below and refer to this sheet in the next few weeks. These discharge instructions provide you with general information on caring for yourself after you leave the hospital. Your doctor may also give you specific instructions. While your treatment has been planned according to the most current medical practices available, unavoidable complications occasionally occur. If you have any problems or questions after discharge, please call your  doctor. ACTIVITY  You may resume your regular activity but move at a slower pace for the next 24 hours.   Take frequent rest periods for the next 24 hours.   Walking will help expel (get rid of) the air and reduce the bloated feeling in your abdomen.   No driving for 24 hours (because of the anesthesia (medicine) used during the test).   You may shower.   Do not sign any important legal documents or operate any machinery for 24 hours (because of the anesthesia used during the test).  NUTRITION  Drink plenty of fluids.   You may resume your normal diet.   Begin with a light meal and progress to your normal diet.   Avoid alcoholic beverages for 24 hours or as instructed by your caregiver.  MEDICATIONS  You may resume your normal medications unless your caregiver tells you otherwise.  WHAT YOU CAN EXPECT TODAY  You may experience abdominal discomfort such as a feeling of fullness or gas pains.  FOLLOW-UP  Your doctor will discuss the results of your test with you.  SEEK IMMEDIATE MEDICAL ATTENTION IF ANY OF THE FOLLOWING OCCUR:  Excessive nausea (feeling sick to your stomach) and/or vomiting.   Severe abdominal pain and distention (swelling).   Trouble swallowing.   Temperature over 101 F (37.8 C).   Rectal bleeding or vomiting of blood.   GERD and colon polyp information provided  Further recommendations to follow pending review of pathology report

## 2018-08-02 NOTE — H&P (Signed)
@LOGO @   Primary Care Physician:  Kathyrn Drown, MD Primary Gastroenterologist:  Dr. Gala Romney  Pre-Procedure History & Physical: HPI:  Bradley [name] is a 73 y.o. male here for surveillance EGD and colonoscopy.  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  Patient denies dysphagia.  GERD well-controlled.  No lower GI tract symptoms.  Past Medical History:  Diagnosis Date  . Barrett esophagus    History of  . GERD (gastroesophageal reflux disease)   . Gout   . History of adenomatous polyp of colon   . Hyperlipidemia   . Hypertension   . Shingles    hx of, on back of neck  . Sleep apnea    Noncompliant with CPAP    Past Surgical History:  Procedure Laterality Date  . COLONOSCOPY    . COLONOSCOPY  12/09/2004   NFA:OZHYQMVHQI polyp at 35 cm biopsied/normal rectum  . COLONOSCOPY   05/20/2008   ONG:EXBMW midsigmoid s/p snare removal/normal rectum. Tubular adenoma  . COLONOSCOPY WITH ESOPHAGOGASTRODUODENOSCOPY (EGD) N/A 03/22/2013   Dr. Gala Romney: Barrett's esophagus status post segmental biopsies with no evidence of dysplasia.  Normal colonoscopy.  Next colonoscopy in 5 years.  . ESOPHAGOGASTRODUODENOSCOPY  07/01/2003   UXL:KGMWNU colored epithelium involving essentially nearly the distal onehalf of the tubular esophagus/Patulous esophagogastric junction/ Moderate sized hiatal hernia/Otherwise normal stomach, normal D1 and D2.  . ESOPHAGOGASTRODUODENOSCOPY  12/09/2004   UVO:ZDGUYQ-IHKVQQV epithelium  beginning at 33 cm consistent with previous diagnosis of Barrett's esophagus  segmentally biopsied. Hiatal hernia. Otherwise normal stomach. Normal D1 and D2  . ESOPHAGOGASTRODUODENOSCOPY   05/20/2008   ZDG:LOVFIE-PPIRJJO epithelium distal esophagus consistent with Barrett's esophagus.  No significant change from prior exam. Multiple biopsies negative for dysplasia.  . TONSILLECTOMY      Prior to Admission medications   Medication Sig Start Date End Date  Taking? Authorizing Provider  allopurinol (ZYLOPRIM) 100 MG tablet Take 100 mg by mouth daily after breakfast.    Yes [provider]  ALPRAZolam (XANAX) 1 MG tablet Take 1 mg by mouth 2 (two) times daily.    Yes [provider]  aspirin EC 81 MG tablet Take 81 mg by mouth at bedtime.    Yes [provider]  atorvastatin (LIPITOR) 40 MG tablet Take 40 mg by mouth every evening.    Yes [provider]  Cyanocobalamin (B-12 IJ) Inject 1 mL as directed every 30 (thirty) days.    Yes [provider]  furosemide (LASIX) 20 MG tablet Take 20 mg by mouth daily as needed for fluid or edema.   Yes [provider]  gabapentin (NEURONTIN) 300 MG capsule Take 300 mg by mouth 4 (four) times daily as needed (pain).    Yes [provider]  lisinopril (PRINIVIL,ZESTRIL) 5 MG tablet 1 bid as directed Patient taking differently: Take 5 mg by mouth daily.  01/26/17  Yes Luking, Elayne Snare, MD  omeprazole (PRILOSEC) 20 MG capsule Take 20 mg by mouth daily after breakfast.    Yes [provider]  pyridostigmine (MESTINON) 60 MG tablet Take 1 tablet (60 mg total) by mouth 3 (three) times daily. Patient taking differently: Take 60 mg by mouth daily.  01/12/17  Yes Penumalli, Earlean Polka, MD  tamsulosin (FLOMAX) 0.4 MG CAPS capsule Take 1 capsule (0.4 mg total) by mouth daily. 02/23/18  Yes Kathyrn Drown, MD  diphenhydrAMINE (BENADRYL) 25 MG tablet Take 25 mg by mouth every 6 (six) hours as needed for allergies or sleep.  [provider]    Allergies as of 08/02/2018  . (No Known Allergies)    Family History  Problem Relation Age of Onset  . Heart attack Father 49       First MI in 66s  . Cancer Brother        prostate  . Colon cancer Mother        age greater than 33s  . Cancer Sister        brain  . COPD Brother     Social History   Socioeconomic History  . Marital status: Married    Spouse name: Merchandiser, retail  . Number of  children: 2  . Years of education: 41  . Highest education level: Not on file  Occupational History  . Occupation: Retired    Comment: [employer] [place]  . Financial resource strain: Not on file  . Food insecurity:    Worry: Not on file    Inability: Not on file  . Transportation needs:    Medical: Not on file    Non-medical: Not on file  Tobacco Use  . Smoking status: Never Smoker  . Smokeless tobacco: Never Used  . Tobacco comment: Increased exposure to second hand smoke  Substance and Sexual Activity  . Alcohol use: No  . Drug use: No  . Sexual activity: Not on file  Lifestyle  . Physical activity:    Days per week: Not on file    Minutes per session: Not on file  . Stress: Not on file  Relationships  . Social connections:    Talks on phone: Not on file    Gets together: Not on file    Attends religious service: Not on file    Active member of club or organization: Not on file    Attends meetings of clubs or organizations: Not on file    Relationship status: Not on file  . Intimate partner violence:    Fear of current or ex partner: Not on file    Emotionally abused: Not on file    Physically abused: Not on file    Forced sexual activity: Not on file  Other Topics Concern  . Not on file  Social History Narrative   Lives with wife   2 children, 1 passed away   caffeine- coffee 1-2 daily    Review of Systems: See HPI, otherwise negative ROS  Physical Exam: BP (!) 143/97   Pulse 83   Temp 98.6 F (37 C) (Oral)   Resp 16   Ht 5\' 4"  (1.626 m)   Wt 99.8 kg   SpO2 96%   BMI 37.76 kg/m  General:   Alert,  Well-developed, well-nourished, pleasant and cooperative in NAD Neck:  Supple; no masses or thyromegaly. No significant cervical adenopathy. Lungs:  Clear throughout to auscultation.   No wheezes, crackles, or rhonchi. No acute distress. Heart:  Regular rate and rhythm; no murmurs, clicks, rubs,  or gallops. Abdomen: Non-distended,  normal bowel sounds.  Soft and nontender without appreciable mass or hepatosplenomegaly.  Pulses:  Normal pulses noted. Extremities:  Without clubbing or edema.  Impression/Plan: Patient with Barrett's esophagus and history of colonic polyps due for surveillance examination via EGD and colonoscopy. The risks, benefits, limitations, imponderables and alternatives regarding both EGD and colonoscopy have been reviewed with the patient. Questions have been answered. All parties agreeable.     Notice: This dictation was prepared with Dragon dictation along with smaller phrase technology. Any  transcriptional errors that result from this process are unintentional and may not be corrected upon review.

## 2018-08-02 NOTE — Telephone Encounter (Signed)
Received VM from pt this morning. He had called office back yesterday evening after phones turned off. He called insurance company back and he will have $100 co-pay so he wants to have TCS/EGD today. He started prep yesterday evening.   Called endo scheduler, orders had been cancelled and will need to be re-entered for TCS/EGD at 1:15pm today. Called and informed pt it's ok to have procedure today. He will arrive at 12:15pm. He finished prep this morning. Melanie at endo aware also. Orders re-entered.

## 2018-08-02 NOTE — Op Note (Signed)
[facility] Patient Name: Bradley [name] Procedure Date: 08/02/2018 1:30 PM MRN: [id number] Date of Birth: [birth year] Attending MD: Norvel Richards , MD CSN: [id number] Age: 73 Admit Type: Ambulatory Procedure:                Colonoscopy Indications:              High risk colon cancer surveillance: Personal                            history of colonic polyps Providers:                Norvel Richards, MD, Selena Lesser RN, RN,                            Lurline Del, RN Referring MD:              Medicines:                Midazolam 8 mg IV, Meperidine 50 mg IV, Ondansetron                            4 mg IV Complications:            No immediate complications. Estimated Blood Loss:     Estimated blood loss was minimal. Procedure:                Pre-Anesthesia Assessment:                           - Prior to the procedure, a History and Physical                            was performed, and patient medications and                            allergies were reviewed. The patient's tolerance of                            previous anesthesia was also reviewed. The risks                            and benefits of the procedure and the sedation                            options and risks were discussed with the patient.                            All questions were answered, and informed consent                            was obtained. Prior Anticoagulants: The patient has                            taken no previous anticoagulant or antiplatelet  agents. ASA Grade Assessment: II - A patient with                            mild systemic disease. After reviewing the risks                            and benefits, the patient was deemed in                            satisfactory condition to undergo the procedure.                           After obtaining informed consent, the colonoscope                            was passed under direct vision.  Throughout the                            procedure, the patient's blood pressure, pulse, and                            oxygen saturations were monitored continuously. The                            CF-HQ190L ([id number]) scope was introduced through                            the anus and advanced to the the cecum, identified                            by appendiceal orifice and ileocecal valve. The                            colonoscopy was performed without difficulty. The                            patient tolerated the procedure well. The quality                            of the bowel preparation was adequate. The                            ileocecal valve, appendiceal orifice, and rectum                            were photographed. The entire colon was well                            visualized. Scope In: 1:35:50 PM Scope Out: 1:49:41 PM Scope Withdrawal Time: 0 hours 10 minutes 15 seconds  Total Procedure Duration: 0 hours 13 minutes 51 seconds  Findings:      The perianal and digital rectal examinations were normal.      Two sessile polyps were found in the rectum and distal  transverse colon.       The polyps were 5 mm in size. These polyps were removed with a cold       snare. Resection and retrieval were complete. Estimated blood loss was       minimal.      Scattered medium-mouthed diverticula were found in the entire colon.      The exam was otherwise without abnormality on direct and retroflexion       views. Impression:               - Two 5 mm polyps in the rectum, in the transverse                            colon and in the distal transverse colon, removed                            with a cold snare. Resected and retrieved.                           - Diverticulosis in the entire examined colon.                           - The examination was otherwise normal on direct                            and retroflexion views. Moderate Sedation:      Moderate (conscious)  sedation was administered by the endoscopy nurse       and supervised by the endoscopist. The following parameters were       monitored: oxygen saturation, heart rate, blood pressure, and response       to care. Recommendation:           - Patient has a contact number available for                            emergencies. The signs and symptoms of potential                            delayed complications were discussed with the                            patient. Return to normal activities tomorrow.                            Written discharge instructions were provided to the                            patient.                           - Resume previous diet.                           - Continue present medications.                           - Repeat colonoscopy date to  be determined after                            pending pathology results are reviewed for                            surveillance.                           - Return to GI office after studies are complete.                            See EGD report Procedure Code(s):        --- Professional ---                           [phone], Colonoscopy, flexible; with removal of                            tumor(s), polyp(s), or other lesion(s) by snare                            technique Diagnosis Code(s):        --- Professional ---                           Z86.010, Personal history of colonic polyps                           K62.1, Rectal polyp                           D12.3, Benign neoplasm of transverse colon (hepatic                            flexure or splenic flexure)                           K57.30, Diverticulosis of large intestine without                            perforation or abscess without bleeding CPT copyright 2018 American Medical Association. All rights reserved. The codes documented in this report are preliminary and upon coder review may  be revised to meet current compliance requirements. Cristopher Estimable.  Shaquoya Cosper, MD Norvel Richards, MD 08/02/2018 2:10:34 PM This report has been signed electronically. Number of Addenda: 0

## 2018-08-07 ENCOUNTER — Encounter (HOSPITAL_COMMUNITY): Payer: Self-pay | Admitting: Internal Medicine

## 2018-08-13 ENCOUNTER — Encounter: Payer: Self-pay | Admitting: Internal Medicine

## 2018-11-06 DIAGNOSIS — M9903 Segmental and somatic dysfunction of lumbar region: Secondary | ICD-10-CM | POA: Diagnosis not present

## 2018-11-06 DIAGNOSIS — S335XXA Sprain of ligaments of lumbar spine, initial encounter: Secondary | ICD-10-CM | POA: Diagnosis not present

## 2018-11-06 DIAGNOSIS — S233XXA Sprain of ligaments of thoracic spine, initial encounter: Secondary | ICD-10-CM | POA: Diagnosis not present

## 2018-11-09 DIAGNOSIS — M9903 Segmental and somatic dysfunction of lumbar region: Secondary | ICD-10-CM | POA: Diagnosis not present

## 2018-11-09 DIAGNOSIS — S233XXA Sprain of ligaments of thoracic spine, initial encounter: Secondary | ICD-10-CM | POA: Diagnosis not present

## 2018-11-09 DIAGNOSIS — S335XXA Sprain of ligaments of lumbar spine, initial encounter: Secondary | ICD-10-CM | POA: Diagnosis not present

## 2018-11-13 DIAGNOSIS — S233XXA Sprain of ligaments of thoracic spine, initial encounter: Secondary | ICD-10-CM | POA: Diagnosis not present

## 2018-11-13 DIAGNOSIS — M9903 Segmental and somatic dysfunction of lumbar region: Secondary | ICD-10-CM | POA: Diagnosis not present

## 2018-11-13 DIAGNOSIS — S335XXA Sprain of ligaments of lumbar spine, initial encounter: Secondary | ICD-10-CM | POA: Diagnosis not present

## 2019-01-29 ENCOUNTER — Telehealth: Payer: Self-pay | Admitting: Family Medicine

## 2019-01-29 DIAGNOSIS — Z79899 Other long term (current) drug therapy: Secondary | ICD-10-CM

## 2019-01-29 DIAGNOSIS — E782 Mixed hyperlipidemia: Secondary | ICD-10-CM

## 2019-01-29 DIAGNOSIS — M109 Gout, unspecified: Secondary | ICD-10-CM

## 2019-01-29 DIAGNOSIS — Z125 Encounter for screening for malignant neoplasm of prostate: Secondary | ICD-10-CM

## 2019-01-29 DIAGNOSIS — I10 Essential (primary) hypertension: Secondary | ICD-10-CM

## 2019-01-29 NOTE — Telephone Encounter (Signed)
Last labs 01/2018: Lipid, Liver, PSA, Met 7

## 2019-01-29 NOTE — Telephone Encounter (Signed)
Blood work ordered in Epic. Patient notified. 

## 2019-01-29 NOTE — Telephone Encounter (Signed)
Lipid, liver, metabolic 7, PSA, uric acid, CBC hypertension hyperlipidemia screening physical, gout

## 2019-01-29 NOTE — Telephone Encounter (Signed)
Patient has physical scheduled for august 10 and was wondering if he needed to do lab work before coming in. Please advise

## 2019-01-31 LAB — HEPATIC FUNCTION PANEL
ALT: 34 IU/L (ref 0–44)
AST: 31 IU/L (ref 0–40)
Albumin: 4.4 g/dL (ref 3.8–4.8)
Alkaline Phosphatase: 57 IU/L (ref 39–117)
Bilirubin Total: 0.5 mg/dL (ref 0.0–1.2)
Bilirubin, Direct: 0.14 mg/dL (ref 0.00–0.40)
Total Protein: 6.1 g/dL (ref 6.0–8.5)

## 2019-01-31 LAB — LIPID PANEL
Chol/HDL Ratio: 4.3 ratio (ref 0.0–5.0)
Cholesterol, Total: 143 mg/dL (ref 100–199)
HDL: 33 mg/dL — ABNORMAL LOW (ref >39)
LDL Calculated: 84 mg/dL (ref 0–99)
Triglycerides: 132 mg/dL (ref 0–149)
VLDL Cholesterol Cal: 26 mg/dL (ref 5–40)

## 2019-01-31 LAB — URIC ACID: Uric Acid: 4.6 mg/dL (ref 3.7–8.6)

## 2019-01-31 LAB — BASIC METABOLIC PANEL
BUN/Creatinine Ratio: 14 (ref 10–24)
BUN: 15 mg/dL (ref 8–27)
CO2: 27 mmol/L (ref 20–29)
Calcium: 9.6 mg/dL (ref 8.6–10.2)
Chloride: 102 mmol/L (ref 96–106)
Creatinine, Ser: 1.08 mg/dL (ref 0.76–1.27)
GFR calc Af Amer: 80 mL/min/{1.73_m2} (ref >59)
GFR calc non Af Amer: 69 mL/min/{1.73_m2} (ref >59)
Glucose: 95 mg/dL (ref 65–99)
Potassium: 5.2 mmol/L (ref 3.5–5.2)
Sodium: 142 mmol/L (ref 134–144)

## 2019-01-31 LAB — PSA: Prostate Specific Ag, Serum: 2.2 ng/mL (ref 0.0–4.0)

## 2019-02-03 ENCOUNTER — Encounter: Payer: Self-pay | Admitting: Family Medicine

## 2019-02-26 ENCOUNTER — Encounter: Payer: Self-pay | Admitting: Family Medicine

## 2019-02-26 ENCOUNTER — Other Ambulatory Visit: Payer: Self-pay

## 2019-02-26 ENCOUNTER — Ambulatory Visit (INDEPENDENT_AMBULATORY_CARE_PROVIDER_SITE_OTHER): Payer: Medicare HMO | Admitting: Family Medicine

## 2019-02-26 VITALS — BP 118/74 | Temp 97.6°F | Ht 64.0 in | Wt 227.6 lb

## 2019-02-26 DIAGNOSIS — Z0000 Encounter for general adult medical examination without abnormal findings: Secondary | ICD-10-CM

## 2019-02-26 NOTE — Progress Notes (Signed)
Subjective:    Patient ID: Bradley [name], male    DOB: November 01, 1945, 73 y.o.   MRN: [id number]  HPI  The patient comes in today for a wellness visit. Cognitive test passes No falls recently Practices good safety Does not use any substances COVID prevention discussed   A review of their health history was completed.  A review of medications was also completed.  Any needed refills; yes  Eating habits: eats what he wants  Falls/  MVA accidents in past few months: none  Regular exercise: no true exercise but stays busy  Specialist pt sees on regular basis: none  Preventative health issues were discussed.   Additional concerns: none   Review of Systems  Constitutional: Negative for activity change, appetite change and fever.  HENT: Negative for congestion and rhinorrhea.   Eyes: Negative for discharge.  Respiratory: Negative for cough and wheezing.   Cardiovascular: Negative for chest pain.  Gastrointestinal: Negative for abdominal pain, blood in stool and vomiting.  Genitourinary: Negative for difficulty urinating and frequency.  Musculoskeletal: Negative for neck pain.  Skin: Negative for rash.  Allergic/Immunologic: Negative for environmental allergies and food allergies.  Neurological: Negative for weakness and headaches.  Psychiatric/Behavioral: Negative for agitation.       Objective:   Physical Exam Constitutional:      Appearance: He is well-developed.  HENT:     Head: Normocephalic and atraumatic.     Right Ear: External ear normal.     Left Ear: External ear normal.     Nose: Nose normal.  Eyes:     Pupils: Pupils are equal, round, and reactive to light.  Neck:     Musculoskeletal: Normal range of motion and neck supple.     Thyroid: No thyromegaly.  Cardiovascular:     Rate and Rhythm: Normal rate and regular rhythm.     Heart sounds: Normal heart sounds. No murmur.  Pulmonary:     Effort: Pulmonary effort is normal. No respiratory distress.      Breath sounds: Normal breath sounds. No wheezing.  Abdominal:     General: Bowel sounds are normal. There is no distension.     Palpations: Abdomen is soft. There is no mass.     Tenderness: There is no abdominal tenderness.  Genitourinary:    Penis: Normal.   Musculoskeletal: Normal range of motion.  Lymphadenopathy:     Cervical: No cervical adenopathy.  Skin:    General: Skin is warm and dry.     Findings: No erythema.  Neurological:     Mental Status: He is alert.     Motor: No abnormal muscle tone.  Psychiatric:        Behavior: Behavior normal.        Judgment: Judgment normal.      Prostate exam normal     Assessment & Plan:  The patient goes to the [place] for his regular health issues Vernard Gambles Grix prescription recommended Copy of lab work reviewed with patient and given to the patient to share with the [facility] In addition to this Adult wellness-complete.wellness physical was conducted today. Importance of diet and exercise were discussed in detail.  In addition to this a discussion regarding safety was also covered. We also reviewed over immunizations and gave recommendations regarding current immunization needed for age.  In addition to this additional areas were also touched on including: Preventative health exams needed:  Colonoscopy up-to-date next 1 due 2025  Patient was advised yearly wellness exam

## 2019-02-26 NOTE — Patient Instructions (Signed)
Shingrix and shingles prevention: know the facts!   Shingrix is a very effective vaccine to prevent shingles.   Shingles is a reactivation of chickenpox -more than 99% of Americans born before 1980 have had chickenpox even if they do not remember it. One in every 10 people who get shingles have severe long-lasting nerve pain as a result.   33 out of a 100 older adults will get shingles if they are unvaccinated.     This vaccine is very important for your health This vaccine is indicated for anyone 50 years or older. You can get this vaccine even if you have already had shingles because you can get the disease more than once in a lifetime.  Your risk for shingles and its complications increases with age.  This vaccine has 2 doses.  The second dose would be 2 to 6 months after the first dose.  If you had Zostavax vaccine in the past you should still get Shingrix. ( Zostavax is only 70% effective and it loses significant strength over a few years .)  This vaccine is given through the pharmacy.  The cost of the vaccine is through your insurance. The pharmacy can inform you of the total costs.  Common side effects including soreness in the arm, some redness and swelling, also some feel fatigue muscle soreness headache low-grade fever.  Side effects typically go away within 2 to 3 days. Remember-the pain from shingles can last a lifetime but these side effects of the vaccine will only last a few days at most. It is very important to get both doses in order to protect yourself fully.   Please get this vaccine at your earliest convenience at your trusted pharmacy.      Results for orders placed or performed in visit on 01/29/19  Lipid panel  Result Value Ref Range   Cholesterol, Total 143 100 - 199 mg/dL   Triglycerides 132 0 - 149 mg/dL   HDL 33 (L) >39 mg/dL   VLDL Cholesterol Cal 26 5 - 40 mg/dL   LDL Calculated 84 0 - 99 mg/dL   Chol/HDL Ratio 4.3 0.0 - 5.0 ratio  Hepatic  function panel  Result Value Ref Range   Total Protein 6.1 6.0 - 8.5 g/dL   Albumin 4.4 3.8 - 4.8 g/dL   Bilirubin Total 0.5 0.0 - 1.2 mg/dL   Bilirubin, Direct 0.14 0.00 - 0.40 mg/dL   Alkaline Phosphatase 57 39 - 117 IU/L   AST 31 0 - 40 IU/L   ALT 34 0 - 44 IU/L  Basic metabolic panel  Result Value Ref Range   Glucose 95 65 - 99 mg/dL   BUN 15 8 - 27 mg/dL   Creatinine, Ser 1.08 0.76 - 1.27 mg/dL   GFR calc non Af Amer 69 >59 mL/min/1.73   GFR calc Af Amer 80 >59 mL/min/1.73   BUN/Creatinine Ratio 14 10 - 24   Sodium 142 134 - 144 mmol/L   Potassium 5.2 3.5 - 5.2 mmol/L   Chloride 102 96 - 106 mmol/L   CO2 27 20 - 29 mmol/L   Calcium 9.6 8.6 - 10.2 mg/dL  PSA  Result Value Ref Range   Prostate Specific Ag, Serum 2.2 0.0 - 4.0 ng/mL  Uric acid  Result Value Ref Range   Uric Acid 4.6 3.7 - 8.6 mg/dL

## 2019-07-24 ENCOUNTER — Other Ambulatory Visit: Payer: Self-pay

## 2019-07-24 ENCOUNTER — Encounter: Payer: Self-pay | Admitting: Diagnostic Neuroimaging

## 2019-07-24 ENCOUNTER — Ambulatory Visit: Payer: Medicare HMO | Admitting: Diagnostic Neuroimaging

## 2019-07-24 VITALS — BP 134/85 | HR 64 | Temp 97.8°F | Ht 64.0 in | Wt 227.8 lb

## 2019-07-24 DIAGNOSIS — G7000 Myasthenia gravis without (acute) exacerbation: Secondary | ICD-10-CM | POA: Insufficient documentation

## 2019-07-24 DIAGNOSIS — B0229 Other postherpetic nervous system involvement: Secondary | ICD-10-CM | POA: Insufficient documentation

## 2019-07-24 MED ORDER — PYRIDOSTIGMINE BROMIDE 60 MG PO TABS: 60.0000 mg | ORAL_TABLET | Freq: Two times a day (BID) | ORAL | 4 refills | Status: AC

## 2019-07-24 NOTE — Progress Notes (Signed)
[facility]  PATIENT: Bradley [name] DOB: May 24, 1946  REFERRING CLINICIAN: Lance Sell, MD HISTORY FROM: patient and wife REASON FOR VISIT: follow up   HISTORICAL  CHIEF COMPLAINT:  Chief Complaint  Patient presents with  . Trigeminal neuralgia    rm 7 wife- Ginger "right sided trigeminal neuralgia was stable but now worse"    HISTORY OF PRESENT ILLNESS:   UPDATE (07/24/19, VRP): Since last visit, doing well with MG. Now with increasing right facial pain (right TN from post-herpetic neuralgia 2008). On gabapentin 600mg  twice a day. Pain worsening in least 6-12 months.   UPDATE 01/12/17: Since last visit, doing well. Tolerating mestinon 60mg  TID. No ptosis. Mild intermittent vision blurring. No drooling or diarrhea. Still with witnessed apnea and snoring.   UPDATE 09/14/16: Since last visit, doing well. Tolerating mestinon with good results. Ptosis resolved. No generalized weakness. No aggravating factors.   PRIOR HPI (07/23/16): 74 year old ambidextrous male with hypertension, hyperlipidemia, obstructive sleep apnea, gout, here for evaluation of bilateral eyelid drooping and perioral numbness. Approximately 10 years ago patient had shingles affecting his right posterior neck and scalp. Ever since that time he has had intermittent postherpetic neuralgia pain symptoms in his right jaw, right scalp and right face. Symptoms seem to be aggravated by cold temperature or chewing. October 2017 patient was at a fair, in cold weather, when he noted his right eyelid was drooping down. Left eyelid was partially drooping as well. He was also having increased sensitivity to light. He had difficult time driving back home. Ever since that time he has had intermittent fluctuating eyelid drooping and photosensitivity. He denies any speech problems, swallowing difficulty, extremity weakness, breathing problems. No similar problems in the past. Patient was evaluated by PCP who ordered MRI  brain and MRA head which showed no significant findings. Carotid ultrasound showed no significant stenosis.   REVIEW OF SYSTEMS: Full 14 system review of systems performed and negative with exception of: memory loss blurred vision hearing loss diff urinating sleep apnea (not on CPAP)   ALLERGIES: No Known Allergies  HOME MEDICATIONS: Outpatient Medications Prior to Visit  Medication Sig Dispense Refill  . allopurinol (ZYLOPRIM) 100 MG tablet Take 100 mg by mouth daily after breakfast.     . ALPRAZolam (XANAX) 1 MG tablet Take 1 mg by mouth 2 (two) times daily.     Marland Kitchen aspirin EC 81 MG tablet Take 81 mg by mouth at bedtime.     Marland Kitchen atorvastatin (LIPITOR) 40 MG tablet Take 40 mg by mouth every evening.     . Cyanocobalamin (B-12 IJ) Inject 1 mL as directed every 30 (thirty) days.     . diphenhydrAMINE (BENADRYL) 25 MG tablet Take 25 mg by mouth every 6 (six) hours as needed for allergies or sleep.     . furosemide (LASIX) 20 MG tablet Take 20 mg by mouth daily as needed for fluid or edema.    . gabapentin (NEURONTIN) 300 MG capsule Take 300 mg by mouth 4 (four) times daily as needed (pain).     Marland Kitchen lisinopril (PRINIVIL,ZESTRIL) 5 MG tablet 1 bid as directed (Patient taking differently: Take 5 mg by mouth daily. ) 180 tablet 1  . omeprazole (PRILOSEC) 20 MG capsule Take 20 mg by mouth daily after breakfast.     . pyridostigmine (MESTINON) 60 MG tablet Take 1 tablet (60 mg total) by mouth 3 (three) times daily. (Patient taking differently: Take 60 mg by mouth daily. ) 270 tablet 4  .  tamsulosin (FLOMAX) 0.4 MG CAPS capsule Take 1 capsule (0.4 mg total) by mouth daily. 90 capsule 3   No facility-administered medications prior to visit.    PAST MEDICAL HISTORY: Past Medical History:  Diagnosis Date  . Barrett esophagus    History of  . GERD (gastroesophageal reflux disease)   . Gout   . History of adenomatous polyp of colon   . Hyperlipidemia   . Hypertension   . 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 Shingles    hx of, on back of neck  . Sleep apnea    Noncompliant with CPAP  . Trigeminal neuralgia     PAST SURGICAL HISTORY: Past Surgical History:  Procedure Laterality Date  . BIOPSY  08/02/2018   Procedure: BIOPSY;  Surgeon: Daneil Dolin, MD;  [facility];  Service: Endoscopy;;  gastric polyp  . COLONOSCOPY    . COLONOSCOPY  12/09/2004   [id number] polyp at 35 cm biopsied/normal rectum  . COLONOSCOPY   05/20/2008   [id number] midsigmoid s/p snare removal/normal rectum. Tubular adenoma  . COLONOSCOPY N/A 08/02/2018   Procedure: COLONOSCOPY;  Surgeon: Daneil Dolin, MD;  [facility];  Service: Endoscopy;  Laterality: N/A;  1:15pm  . COLONOSCOPY WITH ESOPHAGOGASTRODUODENOSCOPY (EGD) N/A 03/22/2013   Dr. Gala Romney: Barrett's esophagus status post segmental biopsies with no evidence of dysplasia.  Normal colonoscopy.  Next colonoscopy in 5 years.  . ESOPHAGOGASTRODUODENOSCOPY  07/01/2003   [id number] colored epithelium involving essentially nearly the distal onehalf of the tubular esophagus/Patulous esophagogastric junction/ Moderate sized hiatal hernia/Otherwise normal stomach, normal D1 and D2.  . ESOPHAGOGASTRODUODENOSCOPY  12/09/2004   [id number] epithelium  beginning at 33 cm consistent with previous diagnosis of Barrett's esophagus  segmentally biopsied. Hiatal hernia. Otherwise normal stomach. Normal D1 and D2  . ESOPHAGOGASTRODUODENOSCOPY   05/20/2008   [id number] epithelium distal esophagus consistent with Barrett's esophagus.  No significant change from prior exam. Multiple biopsies negative for dysplasia.  Marland Kitchen ESOPHAGOGASTRODUODENOSCOPY N/A 08/02/2018   Procedure: ESOPHAGOGASTRODUODENOSCOPY (EGD);  Surgeon: Daneil Dolin, MD;  [facility];  Service: Endoscopy;  Laterality: N/A;  . POLYPECTOMY  08/02/2018   Procedure: POLYPECTOMY;  Surgeon: Daneil Dolin, MD;  [facility];  Service: Endoscopy;;  distal  transverse colon(CSx1), rectal(CSx1)  . TONSILLECTOMY      FAMILY HISTORY: Family History  Problem Relation Age of Onset  . Heart attack Father 86       First MI in 26s  . Cancer Brother        prostate  . Colon cancer Mother        age greater than 48s  . Cancer Sister        brain  . COPD Brother     SOCIAL HISTORY:  Social History   Socioeconomic History  . Marital status: Married    Spouse name: Merchandiser, retail  . Number of children: 2  . Years of education: 13  . Highest education level: Not on file  Occupational History  . Occupation: Retired    Comment: Hotel manager  Tobacco Use  . Smoking status: Never Smoker  . Smokeless tobacco: Never Used  . Tobacco comment: Increased exposure to second hand smoke  Substance and Sexual Activity  . Alcohol use: No  . Drug use: No  . Sexual activity: Not on file  Other Topics Concern  . Not on file  Social History Narrative   Lives with wife   2 children, 1  passed away   caffeine- coffee 1-2 daily   Social Determinants of Health   Financial Resource Strain:   . Difficulty of Paying Living Expenses: Not on file  Food Insecurity:   . Worried About Charity fundraiser in the Last Year: Not on file  . Ran Out of Food in the Last Year: Not on file  Transportation Needs:   . Lack of Transportation (Medical): Not on file  . Lack of Transportation (Non-Medical): Not on file  Physical Activity:   . Days of Exercise per Week: Not on file  . Minutes of Exercise per Session: Not on file  Stress:   . Feeling of Stress : Not on file  Social Connections:   . Frequency of Communication with Friends and Family: Not on file  . Frequency of Social Gatherings with Friends and Family: Not on file  . Attends Religious Services: Not on file  . Active Member of Clubs or Organizations: Not on file  . Attends Archivist Meetings: Not on file  . Marital Status: Not on file  Intimate Partner Violence:   . Fear of Current or  Ex-Partner: Not on file  . Emotionally Abused: Not on file  . Physically Abused: Not on file  . Sexually Abused: Not on file     PHYSICAL EXAM  GENERAL EXAM/CONSTITUTIONAL: Vitals:  Vitals:   07/24/19 0908  BP: 134/85  Pulse: 64  Temp: 97.8 F (36.6 C)  Weight: 227 lb 12.8 oz (103.3 kg)  Height: 5\' 4"  (1.626 m)   Body mass index is 39.1 kg/m. No exam data present  Patient is in no distress; well developed, nourished and groomed; neck is supple  CARDIOVASCULAR:  Examination of carotid arteries is normal; no carotid bruits  Regular rate and rhythm, no murmurs  Examination of peripheral vascular system by observation and palpation is normal  EYES:  Ophthalmoscopic exam of optic discs and posterior segments is normal; no papilledema or hemorrhages  MUSCULOSKELETAL:  Gait, strength, tone, movements noted in Neurologic exam below  NEUROLOGIC: MENTAL STATUS:  No flowsheet data found.  awake, alert, oriented to person, place and time  recent and remote memory intact  normal attention and concentration  language fluent, comprehension intact, naming intact,   fund of knowledge appropriate  CRANIAL NERVE:   2nd - no papilledema on fundoscopic exam  2nd, 3rd, 4th, 6th - pupils equal and reactive to light, visual fields full to confrontation, extraocular muscles intact, no nystagmus  5th - facial sensation symmetric  7th - facial strength --> DECR LEFT NL FOLD; LEFT LIP LOWER THAN RIGHTsymmetric  8th - hearing DECR  9th - palate elevates symmetrically, uvula midline  11th - shoulder shrug symmetric  12th - tongue protrusion midline  MOTOR:   normal bulk and tone, full strength in the BUE, BLE  SENSORY:   normal and symmetric to light touch, temperature, vibration  COORDINATION:   finger-nose-finger, fine finger movements normal  REFLEXES:   deep tendon reflexes TRACE and symmetric  GAIT/STATION:   narrow based gait    DIAGNOSTIC DATA  (LABS, IMAGING, TESTING) - I reviewed patient records, labs, notes, testing and imaging myself where available.  Lab Results  Component Value Date   WBC 6.9 05/07/2013   HGB 16.0 05/07/2013   HCT 46.0 05/07/2013   MCV 91.1 05/07/2013   PLT 205 05/07/2013      Component Value Date/Time   NA 142 01/30/2019 1059   K 5.2 01/30/2019 1059  CL 102 01/30/2019 1059   CO2 27 01/30/2019 1059   GLUCOSE 95 01/30/2019 1059   GLUCOSE 99 05/07/2013 1301   BUN 15 01/30/2019 1059   CREATININE 1.08 01/30/2019 1059   CALCIUM 9.6 01/30/2019 1059   PROT 6.1 01/30/2019 1059   ALBUMIN 4.4 01/30/2019 1059   AST 31 01/30/2019 1059   ALT 34 01/30/2019 1059   ALKPHOS 57 01/30/2019 1059   BILITOT 0.5 01/30/2019 1059   GFRNONAA 69 01/30/2019 1059   GFRAA 80 01/30/2019 1059   Lab Results  Component Value Date   CHOL 143 01/30/2019   HDL 33 (L) 01/30/2019   LDLCALC 84 01/30/2019   TRIG 132 01/30/2019   CHOLHDL 4.3 01/30/2019   Lab Results  Component Value Date   HGBA1C 5.6 07/23/2016   No results found for: VITAMINB12 Lab Results  Component Value Date   TSH 2.730 07/23/2016    AChR Binding Ab, Serum  Date Value Ref Range Status  07/23/2016 12.90 (H) 0.00 - 0.24 nmol/L Final    Comment:    **Results verified by repeat testing**                                Negative:   0.00 - 0.24                                Borderline: 0.25 - 0.40                                Positive:        > 0.40     06/23/16 MRI brain [I reviewed images myself and agree with interpretation. -VRP] - No acute finding. Normal for age. Few small punctate white matter foci, not likely significant.  06/23/16 MRA head [I reviewed images myself and agree with interpretation. -VRP]  - Normal intracranial MR angiography of the large and medium size vessels.  07/15/16 Carotid u/s  1. No evidence of significant carotid stenosis in the neck bilaterally. No evidence of right carotid stenosis. Minimal plaque on the  left with estimated left ICA stenosis of less than 50%. 2. Nonvisualization of the right vertebral artery by duplex ultrasound. The left vertebral artery demonstrates antegrade flow.  10/21/16 CT chest  1. No evidence for mediastinal mass lesion. 2. Clear lung fields 3. Hepatic steatosis     ASSESSMENT AND PLAN  74 y.o. year old male here with new onset bilateral ptosis, fluctuating, right worse than left side, since October 2017. AchR antibody positive.    Dx:  1. 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 pyridostigmine (MESTINON) 60 MG tablet    Sig: Take 1 tablet (60 mg total) by mouth 2 (two) times daily.    Dispense:  270 tablet    Refill:  4   Return in about 1 year (around 07/23/2020).    Penni Bombard, MD [id number], 123456 AM Certified  in Neurology, Neurophysiology and Neuroimaging  [facility] [address], [place], [place] [address] [phone]

## 2019-07-24 NOTE — Patient Instructions (Addendum)
  POST-HERPETIC NEURALGIA (right trigeminal pain) - increase gabapentin to 900mg  twice a day; may consider carbamazepine in future   OCULAR MYASTHENIA GRAVIS - continue pyridostigmine 60mg  twice a day

## 2019-07-30 ENCOUNTER — Telehealth: Payer: Self-pay | Admitting: Diagnostic Neuroimaging

## 2019-07-30 NOTE — Telephone Encounter (Signed)
Pt's wife called and stated that the [place] office will not take a prescription. The prescription for the pt's gabapentin (NEURONTIN) 300 MG capsule has to be faxed to [phone] Dr. Nathanial Rancher

## 2019-07-31 MED ORDER — GABAPENTIN 300 MG PO CAPS: 900.0000 mg | ORAL_CAPSULE | Freq: Two times a day (BID) | ORAL | 5 refills | Status: DC

## 2019-07-31 NOTE — Telephone Encounter (Signed)
Gabapentin Rx signed and successfully faxed to Dr Nathanial Rancher, [phone]. Called wife, Ginger,  on DPR and advised her. She  verbalized understanding, appreciation.

## 2019-07-31 NOTE — Telephone Encounter (Signed)
Rx on Dr [employer] desk for signature.

## 2019-08-23 ENCOUNTER — Telehealth: Payer: Self-pay | Admitting: Diagnostic Neuroimaging

## 2019-08-23 MED ORDER — CARBAMAZEPINE ER 200 MG PO CP12: 200.0000 mg | ORAL_CAPSULE | Freq: Two times a day (BID) | ORAL | 12 refills | Status: DC

## 2019-08-23 NOTE — Telephone Encounter (Signed)
Pt's wife Ginger, on DPR called stating that the pt's gabapentin (NEURONTIN) 300 MG capsule is not working for him. He continues to have numb lips and his cheeks are still hurting. Please advise.

## 2019-08-23 NOTE — Telephone Encounter (Signed)
Called patient who stated he's been taking gabapentin 9000 mg twice daily with no relief of his pain. He stated Dr Leta Baptist mentioned a different medication if gabapentin didn't work. He'd like for it to be prescribed. Rx must be faxed to  [facility] fax #Dr [place], [phone]. I advised will send request to Dr Leta Baptist. He requested a call back when done,  verbalized understanding, appreciation.

## 2019-08-23 NOTE — Telephone Encounter (Signed)
Called patient and advised him Dr Leta Baptist prescribed carbamazepine 200 mg, one tab twice daily. He asked about weaning off gabapentin. I confirmed with Dr Leta Baptist, he is to stay on gapapentin. Patient stated that was a lot of pills; I advised he try it for a week or two and he agreed. Patient verbalized understanding, appreciation.

## 2019-08-23 NOTE — Telephone Encounter (Signed)
Meds ordered this encounter  Medications  . carbamazepine (EQUETRO) 200 MG CP12 12 hr capsule    Sig: Take 1 capsule (200 mg total) by mouth 2 (two) times daily.    Dispense:  60 capsule    Refill:  12   Penni Bombard, MD [id number], 0000000 PM Certified in Neurology, Neurophysiology and Neuroimaging  [facility] [address] [place], [place] [address] [phone]

## 2020-03-13 ENCOUNTER — Ambulatory Visit (INDEPENDENT_AMBULATORY_CARE_PROVIDER_SITE_OTHER): Payer: No Typology Code available for payment source | Admitting: General Surgery

## 2020-03-13 ENCOUNTER — Encounter: Payer: Self-pay | Admitting: General Surgery

## 2020-03-13 ENCOUNTER — Other Ambulatory Visit: Payer: Self-pay

## 2020-03-13 VITALS — BP 135/83 | HR 62 | Temp 97.7°F | Resp 14 | Ht 64.0 in | Wt 207.0 lb

## 2020-03-13 DIAGNOSIS — K838 Other specified diseases of biliary tract: Secondary | ICD-10-CM

## 2020-03-13 NOTE — Progress Notes (Signed)
Bradley [name]; [id number]; [birth year]   HPI Patient is a 74yo wm who was referred to my care by the [facility] for evaluation and treatment of biliary sludge.  This was found on ultrasound, presumably for workup of kidney stones.  Patient denies nausea, vomiting, right upper quadrant abdominal pain, fatty food intolerance, fever, chills, or jaundice.  Has 0 out of 10 abdominal pain.  Has been told that his blood work is fine. Past Medical History:  Diagnosis Date  . Barrett esophagus    History of  . GERD (gastroesophageal reflux disease)   . Gout   . History of adenomatous polyp of colon   . Hyperlipidemia   . Hypertension   . 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 Shingles    hx of, on back of neck  . Sleep apnea    Noncompliant with CPAP  . Trigeminal neuralgia     Past Surgical History:  Procedure Laterality Date  . BIOPSY  08/02/2018   Procedure: BIOPSY;  Surgeon: Daneil Dolin, MD;  [facility];  Service: Endoscopy;;  gastric polyp  . COLONOSCOPY    . COLONOSCOPY  12/09/2004   [id number] polyp at 35 cm biopsied/normal rectum  . COLONOSCOPY   05/20/2008   [id number] midsigmoid s/p snare removal/normal rectum. Tubular adenoma  . COLONOSCOPY N/A 08/02/2018   Procedure: COLONOSCOPY;  Surgeon: Daneil Dolin, MD;  [facility];  Service: Endoscopy;  Laterality: N/A;  1:15pm  . COLONOSCOPY WITH ESOPHAGOGASTRODUODENOSCOPY (EGD) N/A 03/22/2013   Dr. Gala Romney: Barrett's esophagus status post segmental biopsies with no evidence of dysplasia.  Normal colonoscopy.  Next colonoscopy in 5 years.  . ESOPHAGOGASTRODUODENOSCOPY  07/01/2003   IWL:[id number] colored epithelium involving essentially nearly the distal onehalf of the tubular esophagus/Patulous esophagogastric junction/ Moderate sized hiatal hernia/Otherwise normal stomach, normal D1 and D2.  . ESOPHAGOGASTRODUODENOSCOPY  12/09/2004   [id number]:[id number] epithelium  beginning at 33 cm consistent with previous diagnosis of  Barrett's esophagus  segmentally biopsied. Hiatal hernia. Otherwise normal stomach. Normal D1 and D2  . ESOPHAGOGASTRODUODENOSCOPY   05/20/2008   [id number] epithelium distal esophagus consistent with Barrett's esophagus.  No significant change from prior exam. Multiple biopsies negative for dysplasia.  Marland Kitchen ESOPHAGOGASTRODUODENOSCOPY N/A 08/02/2018   Procedure: ESOPHAGOGASTRODUODENOSCOPY (EGD);  Surgeon: Daneil Dolin, MD;  [facility];  Service: Endoscopy;  Laterality: N/A;  . POLYPECTOMY  08/02/2018   Procedure: POLYPECTOMY;  Surgeon: Daneil Dolin, MD;  [facility];  Service: Endoscopy;;  distal transverse colon(CSx1), rectal(CSx1)  . TONSILLECTOMY      Family History  Problem Relation Age of Onset  . Heart attack Father 81       First MI in 69s  . Cancer Brother        prostate  . Colon cancer Mother        age greater than 76s  . Cancer Sister        brain  . COPD Brother     Current Outpatient Medications on File Prior to Visit  Medication Sig Dispense Refill  . allopurinol (ZYLOPRIM) 100 MG tablet Take 100 mg by mouth daily after breakfast.     . ALPRAZolam (XANAX) 1 MG tablet Take 1 mg by mouth 2 (two) times daily.     Marland Kitchen aspirin EC 81 MG tablet Take 81 mg by mouth at bedtime.     Marland Kitchen atorvastatin (LIPITOR) 40 MG tablet Take 40 mg by mouth every evening.     . Cyanocobalamin (  B-12 IJ) Inject 1 mL as directed every 30 (thirty) days.     . diphenhydrAMINE (BENADRYL) 25 MG tablet Take 25 mg by mouth every 6 (six) hours as needed for allergies or sleep.     . furosemide (LASIX) 20 MG tablet Take 20 mg by mouth daily as needed for fluid or edema.    . gabapentin (NEURONTIN) 300 MG capsule Take 3 capsules (900 mg total) by mouth 2 (two) times daily. 180 capsule 5  . omeprazole (PRILOSEC) 20 MG capsule Take 20 mg by mouth daily after breakfast.     . pyridostigmine (MESTINON) 60 MG tablet Take 1 tablet (60 mg total) by mouth 2 (two) times daily. 270  tablet 4  . tamsulosin (FLOMAX) 0.4 MG CAPS capsule Take 1 capsule (0.4 mg total) by mouth daily. 90 capsule 3   No current facility-administered medications on file prior to visit.    No Known Allergies  Social History   Substance and Sexual Activity  Alcohol Use No    Social History   Tobacco Use  Smoking Status Never Smoker  Smokeless Tobacco Never Used  Tobacco Comment   Increased exposure to second hand smoke    Review of Systems  Constitutional: Negative.   HENT: Negative.   Eyes: Negative.   Respiratory: Negative.   Cardiovascular: Negative.   Gastrointestinal: Negative.   Genitourinary: Negative.   Musculoskeletal: Positive for back pain.  Skin: Negative.   Neurological: Negative.   Endo/Heme/Allergies: Negative.   Psychiatric/Behavioral: Negative.     Objective   Vitals:   03/13/20 0954  BP: 135/83  Pulse: 62  Resp: 14  Temp: 97.7 F (36.5 C)  SpO2: 96%    Physical Exam Vitals reviewed.  Constitutional:      Appearance: Normal appearance. He is not ill-appearing.  HENT:     Head: Normocephalic and atraumatic.  Cardiovascular:     Rate and Rhythm: Normal rate and regular rhythm.     Heart sounds: Normal heart sounds. No murmur heard.  No friction rub. No gallop.   Pulmonary:     Effort: Pulmonary effort is normal. No respiratory distress.     Breath sounds: Normal breath sounds. No stridor. No wheezing, rhonchi or rales.  Abdominal:     General: Bowel sounds are normal. There is no distension.     Palpations: Abdomen is soft. There is no mass.     Tenderness: There is no abdominal tenderness. There is no guarding.     Hernia: No hernia is present.  Skin:    General: Skin is warm and dry.  Neurological:     Mental Status: He is alert and oriented to person, place, and time.   Records from [facility] reviewed.  Assessment  Biliary sludge, asymptomatic Plan   No indication for cholecystectomy at this time.  Patient understands and  agrees.  Literature given about biliary colic.  Follow up here prn.

## 2020-03-13 NOTE — Patient Instructions (Signed)
Biliary Colic, Adult  Biliary colic is severe pain caused by a problem with a small organ in the upper right part of your belly (gallbladder). The gallbladder stores a digestive fluid produced in the liver (bile) that helps the body break down fat. Bile and other digestive enzymes are carried from the liver to the small intestine through tube-like structures (bile ducts). The gallbladder and the bile ducts form the biliary tract. Sometimes hard deposits of digestive fluids form in the gallbladder (gallstones) and block the flow of bile from the gallbladder, causing biliary colic. This condition is also called a gallbladder attack. Gallstones can be as small as a grain of sand or as big as a golf ball. There could be just one gallstone in the gallbladder, or there could be many. What are the causes? Biliary colic is usually caused by gallstones. Less often, a tumor could block the flow of bile from the gallbladder and trigger biliary colic. What increases the risk? This condition is more likely to develop in:  Women.  People of Hispanic descent.  People with a family history of gallstones.  People who are obese.  People who suddenly or quickly lose weight.  People who eat a high-calorie, low-fiber diet that is rich in refined carbs (carbohydrates), such as white bread and white rice.  People who have an intestinal disease that affects nutrient absorption, such as Crohn disease.  People who have a metabolic condition, such as metabolic syndrome or diabetes. What are the signs or symptoms? Severe pain in the upper right side of the belly is the main symptom of biliary colic. You may feel this pain below the chest but above the hip. This pain often occurs at night or after eating a very fatty meal. This pain may get worse for up to an hour and last as long as 12 hours. In most cases, the pain fades (subsides) within a couple hours. Other symptoms of this condition include:  Nausea and  vomiting.  Pain under the right shoulder. How is this diagnosed? This condition is diagnosed based on your medical history, your symptoms, and a physical exam. You may have tests, including:  Blood tests to rule out infection or inflammation of the bile ducts, gallbladder, pancreas, or liver.  Imaging studies such as: ? Ultrasound. ? CT scan. ? MRI. In some cases, you may need to have an imaging study done using a small amount of radioactive material (nuclear medicine) to confirm the diagnosis. How is this treated? Treatment for this condition may include medicine to relieve your pain or nausea. If you have gallstones that are causing biliary colic, you may need surgery to remove the gallbladder (cholecystectomy). Gallstones can also be dissolved gradually with medicine. It may take months or years before the gallstones are completely gone. Follow these instructions at home:  Take over-the-counter and prescription medicines only as told by your health care provider.  Drink enough fluid to keep your urine clear or pale yellow.  Follow instructions from your health care provider about eating or drinking restrictions. These may include avoiding: ? Fatty, greasy, and fried foods. ? Any foods that make the pain worse. ? Overeating. ? Having a large meal after not eating for a while.  Keep all follow-up visits as told by your health care provider. This is important. How is this prevented? Steps to prevent this condition include:  Maintaining a healthy body weight.  Getting regular exercise.  Eating a healthy, high-fiber, low-fat diet.  Limiting how much   sugar and refined carbs you eat, such as sweets, white flour, and white rice. Contact a health care provider if:  Your pain lasts more than 5 hours.  You vomit.  You have a fever and chills.  Your pain gets worse. Get help right away if:  Your skin or the whites of your eyes look yellow (jaundice).  Your have tea-colored  urine and light-colored stools.  You are dizzy or you faint. Summary  Biliary colic is severe pain caused by a problem with a small organ in the upper right part of your belly (gallbladder).  Treatments for this condition include medicines that relieves your pain or nausea and medicines that slowly dissolves the gallstones.  If gallstones cause your biliary colic, the treatment is surgery to remove the gallbladder (cholecystectomy). This information is not intended to replace advice given to you by your health care provider. Make sure you discuss any questions you have with your health care provider. Document Revised: 06/17/2017 Document Reviewed: 01/19/2016 Elsevier Patient Education  2020 [employer]  

## 2020-07-15 ENCOUNTER — Telehealth: Payer: Self-pay | Admitting: *Deleted

## 2020-07-15 NOTE — Telephone Encounter (Signed)
Wife called to cx appt on 07/29/20 with Dr. Marjory Lies. He now follows at [facility] under a neurologist there. No longer going to follow at our office. Appt cx.

## 2020-07-24 ENCOUNTER — Ambulatory Visit (INDEPENDENT_AMBULATORY_CARE_PROVIDER_SITE_OTHER): Payer: Medicare HMO | Admitting: Family Medicine

## 2020-07-24 ENCOUNTER — Encounter: Payer: Self-pay | Admitting: Family Medicine

## 2020-07-24 ENCOUNTER — Other Ambulatory Visit: Payer: Self-pay

## 2020-07-24 VITALS — BP 126/86 | HR 60 | Ht 65.0 in | Wt 210.0 lb

## 2020-07-24 DIAGNOSIS — Z0000 Encounter for general adult medical examination without abnormal findings: Secondary | ICD-10-CM | POA: Diagnosis not present

## 2020-07-24 NOTE — Progress Notes (Signed)
Subjective:    Patient ID: Bradley [name], male    DOB: [birth year], 75 y.o.   MRN: [id number]  HPI AWV- Annual Wellness Visit  The patient was seen for their annual wellness visit. The patient's past medical history, surgical history, and family history were reviewed. Pertinent vaccines were reviewed ( tetanus, pneumonia, shingles, flu) The patient's medication list was reviewed and updated.  The height and weight were entered.  BMI recorded in electronic record elsewhere  Cognitive screening was completed. Outcome of Mini - Cog: pass   Falls /depression screening electronically recorded within record elsewhere  Current tobacco usage: none (All patients who use tobacco were given written and verbal information on quitting)  Recent listing of emergency department/hospitalizations over the past year were reviewed.  current specialist the patient sees on a regular basis: sees [place] doctor in [place]   Medicare annual wellness visit patient questionnaire was reviewed.  A written screening schedule for the patient for the next 5-10 years was given. Appropriate discussion of followup regarding next visit was discussed.   Concerns about if he should be taking aspirin or not. Prostate exam completed  bp is sometimes low and only takes lisinopril if he feels bp is high.    Review of Systems  Constitutional: Negative for activity change, appetite change and fever.  HENT: Negative for congestion and rhinorrhea.   Eyes: Negative for discharge.  Respiratory: Negative for cough and wheezing.   Cardiovascular: Negative for chest pain.  Gastrointestinal: Negative for abdominal pain, blood in stool and vomiting.  Genitourinary: Negative for difficulty urinating and frequency.  Musculoskeletal: Negative for neck pain.  Skin: Negative for rash.  Allergic/Immunologic: Negative for environmental allergies and food allergies.  Neurological: Negative for weakness and headaches.   Psychiatric/Behavioral: Negative for agitation.       Objective:   Physical Exam Constitutional:      Appearance: He is well-developed and well-nourished.  HENT:     Head: Normocephalic and atraumatic.     Right Ear: External ear normal.     Left Ear: External ear normal.     Nose: Nose normal.     Mouth/Throat:     Mouth: Oropharynx is clear and moist.  Eyes:     Extraocular Movements: EOM normal.     Pupils: Pupils are equal, round, and reactive to light.  Neck:     Thyroid: No thyromegaly.  Cardiovascular:     Rate and Rhythm: Normal rate and regular rhythm.     Heart sounds: Normal heart sounds. No murmur heard.   Pulmonary:     Effort: Pulmonary effort is normal. No respiratory distress.     Breath sounds: Normal breath sounds. No wheezing.  Abdominal:     General: Bowel sounds are normal. There is no distension.     Palpations: Abdomen is soft. There is no mass.     Tenderness: There is no abdominal tenderness.  Genitourinary:    Penis: Normal.   Musculoskeletal:        General: No edema. Normal range of motion.     Cervical back: Normal range of motion and neck supple.  Lymphadenopathy:     Cervical: No cervical adenopathy.  Skin:    General: Skin is warm and dry.     Findings: No erythema.  Neurological:     Mental Status: He is alert.     Motor: No abnormal muscle tone.  Psychiatric:        Mood and Affect: Mood and affect  normal.        Behavior: Behavior normal.        Judgment: Judgment normal.   Prostate exam normal  The patient gets all of his lab work through the [place] I listed out what labs for him to get including a PSA he will get these and he will get a copy from the [place] forwarded to [place] for our review and records      Assessment & Plan:  1. Routine general medical examination at a health care facility Adult wellness-complete.wellness physical was conducted today. Importance of diet and exercise were discussed in detail.  In addition to this  a discussion regarding safety was also covered. We also reviewed over immunizations and gave recommendations regarding current immunization needed for age.  In addition to this additional areas were also touched on including: Preventative health exams needed:  Colonoscopy Next 08/08/2023  Patient was advised yearly wellness exam   2. Encounter for subsequent annual wellness visit (AWV) in Medicare patient Shingles vaccine recommended.  Otherwise up-to-date on vaccines.  Healthy diet recommended.  Fall avoidance exercises discussed.

## 2020-07-24 NOTE — Patient Instructions (Addendum)
Please do your lab work and send [place] a copy.  Please make sure that your cholesterol kidney function liver function and PSA are checked.   Shingrix and shingles prevention: know the facts!   Shingrix is a very effective vaccine to prevent shingles.   Shingles is a reactivation of chickenpox -more than 99% of Americans born before 1980 have had chickenpox even if they do not remember it. One in every 10 people who get shingles have severe long-lasting nerve pain as a result.   33 out of a 100 older adults will get shingles if they are unvaccinated.     This vaccine is very important for your health This vaccine is indicated for anyone 50 years or older. You can get this vaccine even if you have already had shingles because you can get the disease more than once in a lifetime.  Your risk for shingles and its complications increases with age.  This vaccine has 2 doses.  The second dose would be 2 to 6 months after the first dose.  If you had Zostavax vaccine in the past you should still get Shingrix. ( Zostavax is only 70% effective and it loses significant strength over a few years .)  This vaccine is given through the pharmacy.  The cost of the vaccine is through your insurance. The pharmacy can inform you of the total costs.  Common side effects including soreness in the arm, some redness and swelling, also some feel fatigue muscle soreness headache low-grade fever.  Side effects typically go away within 2 to 3 days. Remember-the pain from shingles can last a lifetime but these side effects of the vaccine will only last a few days at most. It is very important to get both doses in order to protect yourself fully.   Please get this vaccine at your earliest convenience at your trusted pharmacy.

## 2020-07-29 ENCOUNTER — Ambulatory Visit: Payer: Medicare HMO | Admitting: Diagnostic Neuroimaging

## 2020-09-03 ENCOUNTER — Emergency Department (HOSPITAL_COMMUNITY): Payer: Medicare HMO

## 2020-09-03 ENCOUNTER — Other Ambulatory Visit: Payer: Self-pay

## 2020-09-03 ENCOUNTER — Emergency Department (HOSPITAL_COMMUNITY)
Admission: EM | Admit: 2020-09-03 | Discharge: 2020-09-04 | Disposition: A | Discharge status: Home / Self Care | Payer: Medicare HMO | Attending: Emergency Medicine | Admitting: Emergency Medicine

## 2020-09-03 ENCOUNTER — Encounter (HOSPITAL_COMMUNITY): Payer: Self-pay

## 2020-09-03 DIAGNOSIS — N390 Urinary tract infection, site not specified: Secondary | ICD-10-CM | POA: Insufficient documentation

## 2020-09-03 DIAGNOSIS — R109 Unspecified abdominal pain: Secondary | ICD-10-CM

## 2020-09-03 DIAGNOSIS — Z79899 Other long term (current) drug therapy: Secondary | ICD-10-CM | POA: Diagnosis not present

## 2020-09-03 DIAGNOSIS — R9389 Abnormal findings on diagnostic imaging of other specified body structures: Secondary | ICD-10-CM

## 2020-09-03 DIAGNOSIS — R1032 Left lower quadrant pain: Secondary | ICD-10-CM | POA: Diagnosis not present

## 2020-09-03 DIAGNOSIS — I10 Essential (primary) hypertension: Secondary | ICD-10-CM | POA: Diagnosis not present

## 2020-09-03 LAB — URINALYSIS, ROUTINE W REFLEX MICROSCOPIC
Bilirubin Urine: NEGATIVE
Glucose, UA: NEGATIVE mg/dL
Ketones, ur: NEGATIVE mg/dL
Nitrite: NEGATIVE
Protein, ur: NEGATIVE mg/dL
Specific Gravity, Urine: 1.009 (ref 1.005–1.030)
WBC, UA: >50 WBC/hpf — ABNORMAL HIGH (ref 0–5)
pH: 5 (ref 5.0–8.0)

## 2020-09-03 LAB — BASIC METABOLIC PANEL
Anion gap: 9 (ref 5–15)
BUN: 17 mg/dL (ref 8–23)
CO2: 24 mmol/L (ref 22–32)
Calcium: 8.9 mg/dL (ref 8.9–10.3)
Chloride: 99 mmol/L (ref 98–111)
Creatinine, Ser: 1.3 mg/dL — ABNORMAL HIGH (ref 0.61–1.24)
GFR, Estimated: 58 mL/min — ABNORMAL LOW (ref >60)
Glucose, Bld: 105 mg/dL — ABNORMAL HIGH (ref 70–99)
Potassium: 3.7 mmol/L (ref 3.5–5.1)
Sodium: 132 mmol/L — ABNORMAL LOW (ref 135–145)

## 2020-09-03 LAB — CBC
HCT: 45.1 % (ref 39.0–52.0)
Hemoglobin: 15.7 g/dL (ref 13.0–17.0)
MCH: 31.8 pg (ref 26.0–34.0)
MCHC: 34.8 g/dL (ref 30.0–36.0)
MCV: 91.3 fL (ref 80.0–100.0)
Platelets: 188 10*3/uL (ref 150–400)
RBC: 4.94 MIL/uL (ref 4.22–5.81)
RDW: 13.8 % (ref 11.5–15.5)
WBC: 17.7 10*3/uL — ABNORMAL HIGH (ref 4.0–10.5)
nRBC: 0 % (ref 0.0–0.2)

## 2020-09-03 MED ORDER — IOHEXOL 300 MG/ML  SOLN
100.0000 mL | Freq: Once | INTRAMUSCULAR | Status: AC | PRN
  Administered 2020-09-03: 100 mL via INTRAVENOUS

## 2020-09-03 MED ORDER — SODIUM CHLORIDE 0.9 % IV SOLN
1.0000 g | Freq: Once | INTRAVENOUS | Status: AC
  Administered 2020-09-03: 1 g via INTRAVENOUS
  Filled 2020-09-03: qty 10

## 2020-09-03 NOTE — ED Notes (Signed)
Patient transported to CT 

## 2020-09-03 NOTE — ED Notes (Signed)
Entered room and introduced self to patient. Pt appears to be resting in bed, respirations are even and unlabored with equal chest rise and fall. Bed is locked in the lowest position, side rails x2, call bell within reach. Pt educated on call light use and hourly rounding, verbalized understanding and in agreement at this time. All questions and concerns voiced addressed. Refreshments offered and provided per patient request. Vital signs cycling q30 minutes. Will continue to monitor.  

## 2020-09-03 NOTE — ED Triage Notes (Signed)
Pt to er, pt states that he has some flank pain about a month ago and saw his pmd, states that he was dx wit ha kidney stone, states that he was doing well and then this am. States that this am he had a sudden onset of back/flank pain.  States that he was drinking a lot of water to try to pass the stone.  States that he has some L back/flank pain, states that his pain is a little bit better.

## 2020-09-03 NOTE — ED Provider Notes (Signed)
[facility] EMERGENCY DEPARTMENT Provider Note   CSN: [id number] Arrival date & time: 09/03/20  1838     History Chief Complaint  Patient presents with   Flank Pain    NARAYAN SCULL is a 75 y.o. male.  Patient c/o left flank pain intermittently for past month. States several weeks ago doctor at [place] told him due to kidney stone. Patient notes return of left flank pain laterally in past day. Pain constant, dull, moderate, non radiating. Also has noted dysuria in past day. Notes remote hx uti, no recent abx. Denies penile discharge. No scrotal or testicular pain or swelling. Denies fever or chills. No nausea/vomiting.   The history is provided by the patient.  Flank Pain Pertinent negatives include no chest pain, no abdominal pain, no headaches and no shortness of breath.       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   Post herpetic neuralgia 07/24/2019   Family history of colon cancer 06/01/2018   Benign prostatic hyperplasia with urinary frequency 02/22/2018   Obesity 10/27/2015   Pernicious anemia 01/30/2014   Personal history of colonic polyps 03/14/2013   HYPERLIPIDEMIA, MIXED 12/25/2008   GOUT 12/25/2008   ANXIETY 12/25/2008   OBSTRUCTIVE SLEEP APNEA 12/25/2008   HYPERTENSION, BENIGN ESSENTIAL 12/25/2008   GASTROESOPHAGEAL REFLUX DISEASE 12/25/2008   BARRETTS ESOPHAGUS 12/25/2008   PALPITATIONS 12/25/2008   CHEST PAIN 12/25/2008    Past Surgical History:  Procedure Laterality Date   BIOPSY  08/02/2018   Procedure: BIOPSY;  Surgeon: Daneil Dolin, MD;   Location: [facility];  Service: Endoscopy;;  gastric polyp   COLONOSCOPY     COLONOSCOPY  12/09/2004   QPY:PPJKDTOIZT polyp at 35 cm biopsied/normal rectum   COLONOSCOPY   05/20/2008   IWP:YKDXI midsigmoid s/p snare removal/normal rectum. Tubular adenoma   COLONOSCOPY N/A 08/02/2018   Procedure: COLONOSCOPY;  Surgeon: Daneil Dolin, MD;  Location: [facility];  Service: Endoscopy;  Laterality: N/A;  1:15pm   COLONOSCOPY WITH ESOPHAGOGASTRODUODENOSCOPY (EGD) N/A 03/22/2013   Dr. Gala Romney: Barrett's esophagus status post segmental biopsies with no evidence of dysplasia.  Normal colonoscopy.  Next colonoscopy in 5 years.   ESOPHAGOGASTRODUODENOSCOPY  07/01/2003   PJA:SNKNLZ colored epithelium involving essentially nearly the distal onehalf of the tubular esophagus/Patulous esophagogastric junction/ Moderate sized hiatal hernia/Otherwise normal stomach, normal D1 and D2.   ESOPHAGOGASTRODUODENOSCOPY  12/09/2004   JQB:HALPFX-TKWIOXB epithelium  beginning at 33 cm consistent with previous diagnosis of Barrett's esophagus  segmentally biopsied. Hiatal hernia. Otherwise normal stomach. Normal D1 and D2   ESOPHAGOGASTRODUODENOSCOPY   05/20/2008   DZH:GDJMEQ-ASTMHDQ epithelium distal esophagus consistent with Barrett's esophagus.  No significant change from prior exam. Multiple biopsies negative for dysplasia.   ESOPHAGOGASTRODUODENOSCOPY N/A 08/02/2018   Procedure: ESOPHAGOGASTRODUODENOSCOPY (EGD);  Surgeon: Daneil Dolin, MD;  Location[facility];  Service: Endoscopy;  Laterality: N/A;   POLYPECTOMY  08/02/2018   Procedure: POLYPECTOMY;  Surgeon: Daneil Dolin, MD;  Location: [facility];  Service:  Endoscopy;;  distal transverse colon(CSx1), rectal(CSx1)   TONSILLECTOMY         Family History  Problem Relation Age of Onset   Heart attack Father 33       First MI in 36s   Cancer Brother        prostate   Colon cancer Mother        age greater than 25s   Cancer Sister         brain   COPD Brother     Social History   Tobacco Use   Smoking status: Never Smoker   Smokeless tobacco: Never Used  Substance Use Topics   Alcohol use: No   Drug use: No    Home Medications Prior to Admission medications   Medication Sig Start Date End Date Taking? Authorizing Provider  allopurinol (ZYLOPRIM) 100 MG tablet Take 100 mg by mouth daily after breakfast.     [provider]  ALPRAZolam (XANAX) 1 MG tablet Take 1 mg by mouth 2 (two) times daily.    [provider]  aspirin EC 81 MG tablet Take 81 mg by mouth at bedtime.     [provider]  atorvastatin (LIPITOR) 40 MG tablet Take 40 mg by mouth every evening.     [provider]  Cyanocobalamin (B-12 IJ) Inject 1 mL as directed every 30 (thirty) days.     [provider]  diphenhydrAMINE (BENADRYL) 25 MG tablet Take 25 mg by mouth every 6 (six) hours as needed for allergies or sleep.     [provider]  furosemide (LASIX) 20 MG tablet Take 20 mg by mouth daily as needed for fluid or edema.    [provider]  gabapentin (NEURONTIN) 300 MG capsule Take 3 capsules (900 mg total) by mouth 2 (two) times daily. 07/31/19   Penumalli, Earlean Polka, MD  lisinopril (ZESTRIL) 40 MG tablet Take 40 mg by mouth daily.    [provider]  naproxen sodium (ANAPROX) 275 MG tablet Take 275 mg by mouth 2 (two) times daily with a meal.    [provider]  omeprazole (PRILOSEC) 20 MG capsule Take 20 mg by mouth daily after breakfast.     [provider]  PRESCRIPTION MEDICATION carbemazepine    [provider]  pyridostigmine (MESTINON) 60 MG tablet Take 1 tablet (60 mg total) by mouth 2 (two) times daily. 07/24/19   Penumalli, Earlean Polka, MD  sildenafil (VIAGRA) 100 MG tablet Take 100 mg by mouth daily as needed for erectile dysfunction.    [provider]  tamsulosin (FLOMAX) 0.4 MG CAPS capsule Take 1 capsule (0.4 mg total) by  mouth daily. 02/23/18   Kathyrn Drown, MD  Vitamin D, Cholecalciferol, 25 MCG (1000 UT) CAPS Take by mouth.    [provider]    Allergies    Patient has no known allergies.  Review of Systems   Review of Systems  Constitutional: Negative for chills and fever.  HENT: Negative for sore throat.   Eyes: Negative for redness.  Respiratory: Negative for shortness of breath.   Cardiovascular: Negative for chest pain.  Gastrointestinal: Negative for abdominal pain, nausea and vomiting.  Genitourinary: Positive for dysuria and flank pain.  Musculoskeletal: Negative for neck pain.  Skin: Negative for rash.  Neurological: Negative for headaches.  Hematological: Does not bruise/bleed easily.  Psychiatric/Behavioral: Negative for confusion.    Physical Exam Updated Vital Signs BP 128/78 (BP Location: Right Arm)  Pulse (!) 105    Temp 99.9 F (37.7 C) (Oral)    Resp 18    Ht 1.626 m (5\' 4" )    Wt 90.7 kg    SpO2 97%    BMI 34.33 kg/m   Physical Exam Vitals and nursing note reviewed.  Constitutional:      Appearance: Normal appearance. He is well-developed.  HENT:     Head: Atraumatic.     Nose: Nose normal.     Mouth/Throat:     Mouth: Mucous membranes are moist.  Eyes:     General: No scleral icterus.    Conjunctiva/sclera: Conjunctivae normal.  Neck:     Trachea: No tracheal deviation.  Cardiovascular:     Rate and Rhythm: Normal rate and regular rhythm.     Pulses: Normal pulses.     Heart sounds: Normal heart sounds. No murmur heard. No friction rub. No gallop.   Pulmonary:     Effort: Pulmonary effort is normal. No accessory muscle usage or respiratory distress.     Breath sounds: Normal breath sounds.  Abdominal:     General: Bowel sounds are normal. There is no distension.     Palpations: Abdomen is soft.     Tenderness: There is no abdominal tenderness. There is no guarding.  Genitourinary:    Comments: No cva tenderness. No scrotal or testicular pain,  swelling or tenderness.  Musculoskeletal:        General: No swelling.     Cervical back: Normal range of motion and neck supple. No rigidity.  Skin:    General: Skin is warm and dry.     Findings: No rash.  Neurological:     Mental Status: He is alert.     Comments: Alert, speech clear.   Psychiatric:        Mood and Affect: Mood normal.     ED Results / Procedures / Treatments   Labs (all labs ordered are listed, but only abnormal results are displayed) Results for orders placed or performed during the hospital encounter of 09/03/20  Urinalysis, Routine w reflex microscopic Urine, Clean Catch  Result Value Ref Range   Color, Urine YELLOW YELLOW   APPearance CLEAR CLEAR   Specific Gravity, Urine 1.009 1.005 - 1.030   pH 5.0 5.0 - 8.0   Glucose, UA NEGATIVE NEGATIVE mg/dL   Hgb urine dipstick SMALL (A) NEGATIVE   Bilirubin Urine NEGATIVE NEGATIVE   Ketones, ur NEGATIVE NEGATIVE mg/dL   Protein, ur NEGATIVE NEGATIVE mg/dL   Nitrite NEGATIVE NEGATIVE   Leukocytes,Ua SMALL (A) NEGATIVE   RBC / HPF 0-5 0 - 5 RBC/hpf   WBC, UA >50 (H) 0 - 5 WBC/hpf   Bacteria, UA RARE (A) NONE SEEN  Basic metabolic panel  Result Value Ref Range   Sodium 132 (L) 135 - 145 mmol/L   Potassium 3.7 3.5 - 5.1 mmol/L   Chloride 99 98 - 111 mmol/L   CO2 24 22 - 32 mmol/L   Glucose, Bld 105 (H) 70 - 99 mg/dL   BUN 17 8 - 23 mg/dL   Creatinine, Ser 1.30 (H) 0.61 - 1.24 mg/dL   Calcium 8.9 8.9 - 10.3 mg/dL   GFR, Estimated 58 (L) >60 mL/min   Anion gap 9 5 - 15  CBC  Result Value Ref Range   WBC 17.7 (H) 4.0 - 10.5 K/uL   RBC 4.94 4.22 - 5.81 MIL/uL   Hemoglobin 15.7 13.0 - 17.0 g/dL   HCT 45.1  39.0 - 52.0 %   MCV 91.3 80.0 - 100.0 fL   MCH 31.8 26.0 - 34.0 pg   MCHC 34.8 30.0 - 36.0 g/dL   RDW 13.8 11.5 - 15.5 %   Platelets 188 150 - 400 K/uL   nRBC 0.0 0.0 - 0.2 %    EKG None  Radiology CT Abdomen Pelvis W Contrast  Result Date: 09/04/2020 CLINICAL DATA:  Flank pain. Left back and  flank pain. Abnormal noncontrast CT. EXAM: CT ABDOMEN AND PELVIS WITH CONTRAST TECHNIQUE: Multidetector CT imaging of the abdomen and pelvis was performed using the standard protocol following bolus administration of intravenous contrast. CONTRAST:  167mL OMNIPAQUE IOHEXOL 300 MG/ML  SOLN COMPARISON:  Noncontrast CT earlier today. FINDINGS: Lower chest: Perifissural lymph about the right minor fissure. No pleural fluid or acute airspace disease. Hepatobiliary: No focal liver abnormality is seen. No gallstones, gallbladder wall thickening, or biliary dilatation. Pancreas: No ductal dilatation or inflammation. Spleen: Normal in size without focal abnormality. Adrenals/Urinary Tract: Normal adrenal glands. Parapelvic cysts in the right kidney. No hydronephrosis. No perinephric edema. 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 No hydronephrosis of either renal collecting system. Nonobstructing stone in the lower left renal moiety. Stranding adjacent to the left distal ureter is again seen. Exophytic 5.9 cm left lower cyst. No evidence of solid renal lesion. No ureteral calculi. Urinary bladder is unremarkable. Stomach/Bowel: Tiny hiatal hernia. Stomach otherwise unremarkable. Normal positioning of the duodenum. There is no small bowel obstruction or inflammation. Normal appendix. Moderate colonic stool burden. There is no colonic wall thickening or pericolonic edema. Sigmoid colon is redundant. Vascular/Lymphatic: Abnormal soft tissue surrounding the abdominal aorta which extends from the level of the renal arteries to the iliac bifurcation. Soft tissue density extends to involve the proximal common iliac arteries. There is adjacent haziness of the periaortic fat and stranding. There is no definite luminal narrowing or vessel occlusions aortic branch vessels are atherosclerotic but without inflammation. No aortic aneurysm. Moderate aortic atherosclerosis. Patent  portal vein. No mesenteric gas. No enlarged lymph nodes by size criteria. Reproductive: Heterogeneous prostate gland which is mildly enlarged. Other: No ascites or free air.  Fat in both inguinal canals. Musculoskeletal: There are no acute or suspicious osseous abnormalities. Degenerative change throughout the lumbar spine and both hips. IMPRESSION: 1. CT with contrast confirms abnormal soft tissue surrounding the infrarenal abdominal aorta extending to the common iliac arteries with adjacent fat stranding. Differential considerations remain the same, with vasculitis, retroperitoneal fibrosis, or lymphoma considered. The aorta and iliac vessels are patent without occlusion or significant stenosis. 2. 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 Stranding adjacent to the distal left ureter is again seen. This may be related to the generalized retroperitoneal process, infection or recently passed stone. Aortic Atherosclerosis (ICD10-I70.0). Electronically Signed   By: Keith Rake M.D.   On: 09/04/2020 00:00   CT Renal Stone Study  Result Date: 09/03/2020 CLINICAL DATA:  Left flank pain.  Kidney stones suspected. EXAM: CT ABDOMEN AND PELVIS WITHOUT CONTRAST TECHNIQUE: Multidetector CT imaging of the abdomen and pelvis was performed following the standard protocol without IV contrast. COMPARISON:  None. FINDINGS: Lower chest: Unremarkable. Hepatobiliary: No focal abnormality in the liver on this study without intravenous contrast. There is no evidence for gallstones, gallbladder wall thickening, or pericholecystic fluid. No intrahepatic or extrahepatic biliary dilation. Pancreas: No focal mass lesion. No dilatation of the main duct.  No intraparenchymal cyst. No peripancreatic edema. Spleen: No splenomegaly. No focal mass lesion. Adrenals/Urinary Tract: No adrenal nodule or mass. No stones are seen in the right kidney or ureter. No secondary changes  in the right kidney or ureter. Probable right central sinus cyst. 5 mm nonobstructing stone identified lower pole moiety left kidney with duplicated left intrarenal collecting system at least partial duplication of the left ureter. 5.9 cm lesion lower pole left kidney measures water density, compatible with a cyst. No left hydroureter although there is some edema or inflammation in the region of the distal left ureter. The urinary bladder appears normal for the degree of distention. Stomach/Bowel: Stomach is unremarkable. No gastric wall thickening. No evidence of outlet obstruction. Duodenum is normally positioned as is the ligament of Treitz. No small bowel wall thickening. No small bowel dilatation. The terminal ileum is normal. The appendix is normal. No gross colonic mass. No colonic wall thickening. Vascular/Lymphatic: No abdominal aortic aneurysm. There is abnormal soft tissue surrounding the infrarenal abdominal aorta (see image 40 of axial series 2). This extends down to the bifurcation (image 52/2) and along both common iliac arteries, right greater than left. There is no gastrohepatic or hepatoduodenal ligament lymphadenopathy. No retroperitoneal or mesenteric lymphadenopathy. No pelvic sidewall lymphadenopathy. Reproductive: Prostate gland is upper normal to mildly enlarged. Other: No intraperitoneal free fluid. Musculoskeletal: No worrisome lytic or sclerotic osseous abnormality. IMPRESSION: 1. Abnormal soft tissue surrounding the infrarenal abdominal aorta and along both common iliac arteries, right greater than left. Findings could be related to vasculitis, lymphoma or retroperitoneal fibrosis. Repeat CT abdomen/pelvis with intravenous contrast recommended to further evaluate. 2. 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 There is some edema or inflammation in the region of the distal  left ureter. Imaging features are nonspecific and may be related to recent stone passage or infection/inflammation. 3. 5.9 cm lower pole left renal cyst. Electronically Signed   By: Misty Stanley M.D.   On: 09/03/2020 21:49    Procedures Procedures   Medications Ordered in ED Medications  cefTRIAXone (ROCEPHIN) 1 g in sodium chloride 0.9 % 100 mL IVPB (has no administration in time range)    ED Course  I have reviewed the triage vital signs and the nursing notes.  Pertinent labs & imaging results that were available during my care of the patient were reviewed by me and considered in my medical decision making (see chart for details).    MDM Rules/Calculators/A&P                          Labs sent. Imaging ordered.   Reviewed nursing notes and prior charts for additional history.   Labs reviewed/interpreted by me - c/w uti,. Urine culture sent. Rocephin iv.   CT reviewed/interpreted by me - inflamm changes noted, radiology rec ct w contrast - ordered.   Iv ns bolus. Iv abx given.  Repeat ct with contrast pending.   Discussed cts w pt.   Recheck pain improved/controlled. Abx rx for home, return precautions.    Rec pcp/urologist f/u, including as relates above findings on ct that wre shared/discussed w pt.       Final Clinical Impression(s) / ED Diagnoses Final diagnoses:  None    Rx / DC Orders ED Discharge Orders    None       Lajean Saver, MD 09/04/20 1535

## 2020-09-04 MED ORDER — CEFDINIR 300 MG PO CAPS: 300.0000 mg | ORAL_CAPSULE | Freq: Two times a day (BID) | ORAL | 0 refills | Status: DC

## 2020-09-04 NOTE — Discharge Instructions (Addendum)
It was our pleasure to provide your ER care today - we hope that you feel better.  The lab work shows a urine infection - take antibiotic as prescribed, drink plenty of fluids, take acetaminophen as need.   Your ct scan was read as showing: IMPRESSION: 1. CT with contrast confirms abnormal soft tissue surrounding the infrarenal abdominal aorta extending to the common iliac arteries with adjacent fat stranding. Differential considerations remain the same, with vasculitis, retroperitoneal fibrosis, or lymphoma considered. The aorta and iliac vessels are patent without occlusion or significant stenosis. 2. 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 Stranding adjacent to the distal left ureter is again seen. This may be related to the generalized retroperitoneal process, infection or recently passed stone. 3. Left renal cyst 5.9 cm  Follow up with primary care doctor in 1 week - discuss above ct findings with them, and have them arrange follow up imaging.   Return to ER if worse, new symptoms, worsening or severe pain, persistent vomiting, trouble breathing, weak/fainting, or other concern.

## 2020-09-05 ENCOUNTER — Telehealth: Payer: Self-pay

## 2020-09-05 NOTE — Telephone Encounter (Signed)
Transition Care Management Unsuccessful Follow-up Telephone Call  Date of discharge and from where:  09/04/2020 from [facility]  Attempts:  1st Attempt  Reason for unsuccessful TCM follow-up call:  Left voice message

## 2020-09-05 NOTE — Telephone Encounter (Signed)
Transition Care Management Follow-up Telephone Call  Date of discharge and from where: 09/04/2020 from [facility].   How have you been since you were released from the hospital? Pt stated that he is feeling much better today.   Any questions or concerns? No  Items Reviewed:  Did the pt receive and understand the discharge instructions provided? Yes   Medications obtained and verified? Yes   Other? No   Any new allergies since your discharge? No   Dietary orders reviewed?   Do you have support at home? Yes   Functional Questionnaire: (I = Independent and D = Dependent) ADLs: I  Bathing/Dressing- I  Meal Prep- I  Eating- I  Maintaining continence- I  Transferring/Ambulation- I  Managing Meds- I  Follow up appointments reviewed:   [facility] f/u appt confirmed? Yes  Scheduled to see Urology with [facility].   Are transportation arrangements needed? No  If their condition worsens, is the pt aware to call PCP or go to the Emergency Dept.? Yes Was the patient provided with contact information for the PCP's office or ED? Yes Was to pt encouraged to call back with questions or concerns? Yes

## 2020-09-06 LAB — URINE CULTURE: Culture: >=100000 — AB

## 2020-09-24 ENCOUNTER — Other Ambulatory Visit: Payer: Self-pay

## 2020-09-24 ENCOUNTER — Encounter: Payer: Self-pay | Admitting: Family Medicine

## 2020-09-24 ENCOUNTER — Ambulatory Visit (INDEPENDENT_AMBULATORY_CARE_PROVIDER_SITE_OTHER): Payer: Medicare HMO | Admitting: Family Medicine

## 2020-09-24 VITALS — BP 134/86 | HR 81 | Temp 94.9°F | Wt 205.4 lb

## 2020-09-24 DIAGNOSIS — N419 Inflammatory disease of prostate, unspecified: Secondary | ICD-10-CM

## 2020-09-24 DIAGNOSIS — N289 Disorder of kidney and ureter, unspecified: Secondary | ICD-10-CM

## 2020-09-24 DIAGNOSIS — R300 Dysuria: Secondary | ICD-10-CM | POA: Diagnosis not present

## 2020-09-24 DIAGNOSIS — D72829 Elevated white blood cell count, unspecified: Secondary | ICD-10-CM | POA: Diagnosis not present

## 2020-09-24 LAB — POCT URINALYSIS DIPSTICK
Protein, UA: POSITIVE — AB
Spec Grav, UA: 1.025 (ref 1.010–1.025)
Urobilinogen, UA: 0.2 E.U./dL
pH, UA: 5 (ref 5.0–8.0)

## 2020-09-24 MED ORDER — SULFAMETHOXAZOLE-TRIMETHOPRIM 800-160 MG PO TABS: 1.0000 | ORAL_TABLET | Freq: Two times a day (BID) | ORAL | 0 refills | Status: DC

## 2020-09-24 NOTE — Progress Notes (Signed)
   Subjective:    Patient ID: Bradley [name], male    DOB: 02-29-1946, 75 y.o.   MRN: [id number]  HPI Pt here due to burning with urination and weak stream at times. Pt went to hospital about 2 weeks ago, antibiotic through IV and oral antibiotics-did improve but has now came back  Patient with dysuria urinary frequency Symptoms present over the past few days Denies high fever chills States he has had some night sweats over the past 2 to 3 days since the symptoms of come back He did have them initially when he was diagnosed with this in the ER but it went away after treatment with antibiotics Denies abdominal pain Denies change in appetite Results for orders placed or performed in visit on 09/24/20  POCT Urinalysis Dipstick  Result Value Ref Range   Color, UA     Clarity, UA     Glucose, UA     Bilirubin, UA +    Ketones, UA     Spec Grav, UA 1.025 1.010 - 1.025   Blood, UA +    pH, UA 5.0 5.0 - 8.0   Protein, UA Positive (A) Negative   Urobilinogen, UA 0.2 0.2 or 1.0 E.U./dL   Nitrite, UA +    Leukocytes, UA Small (1+) (A) Negative   Appearance     Odor +     Review of Systems Please see above    Objective:   Physical Exam Lungs clear respiratory rate normal heart regular Abdomen soft obese no guarding rebound or tenderness Prostate enlarged and tender       Assessment & Plan:  UA consistent with UTI Prostatitis on exam Bactrim DS twice daily for 3 weeks Repeat blood work to relook at [facility] and electrolytes in 3 weeks with follow-up office visit at that time If his night sweats are persistent we will pursue forward with further work-up and potentially repeat of CT scan in approximately 8 to 12 weeks. Will also touch base with oncology to see if there is any additional approach we should be doing  Oncology did recommend further evaluation work-up we will be calling the patient

## 2020-09-25 ENCOUNTER — Telehealth: Payer: Self-pay | Admitting: Family Medicine

## 2020-09-25 NOTE — Telephone Encounter (Signed)
Please advise. Thank you

## 2020-09-25 NOTE — Telephone Encounter (Signed)
Patient was seen yesterday a was suppose to get 3 day pill for his prostate but only antibiotic was called into [facility] Please advise

## 2020-09-26 LAB — URINE CULTURE

## 2020-09-26 NOTE — Telephone Encounter (Signed)
Left message to return call 

## 2020-09-26 NOTE — Telephone Encounter (Signed)
Pt returned call and verbalized understanding  

## 2020-09-26 NOTE — Telephone Encounter (Signed)
It is possible he misunderstood me because of the masks that we wear  I told him he will need to be on antibiotics for 3 weeks and that is what we sent in  He will also do blood work before his follow-up visit  Please see if that clarifies the situation if obviously there is still questions concerns or confusion please let me know

## 2020-09-28 ENCOUNTER — Telehealth: Payer: Self-pay | Admitting: Family Medicine

## 2020-09-28 NOTE — Telephone Encounter (Signed)
Patient had recent CT scan which showed retroperitoneal area possibility of retroperitoneal fibrosis versus lymphoma.  I interacted with oncology specialist they recommend further work-up.  Will call patient to discuss with him.

## 2020-10-10 NOTE — Telephone Encounter (Signed)
I spoke with the patient.  He will be following up doing blood work next week and doing an office visit with [place].  We will discuss more what Dr. Delton Coombes had to say at that time.  We will also check a urinalysis when he comes next week.

## 2020-10-14 LAB — COMPREHENSIVE METABOLIC PANEL
ALT: 32 IU/L (ref 0–44)
AST: 32 IU/L (ref 0–40)
Albumin/Globulin Ratio: 1.9 (ref 1.2–2.2)
Albumin: 4.3 g/dL (ref 3.7–4.7)
Alkaline Phosphatase: 105 IU/L (ref 44–121)
BUN/Creatinine Ratio: 11 (ref 10–24)
BUN: 14 mg/dL (ref 8–27)
Bilirubin Total: 0.4 mg/dL (ref 0.0–1.2)
CO2: 22 mmol/L (ref 20–29)
Calcium: 9.3 mg/dL (ref 8.6–10.2)
Chloride: 102 mmol/L (ref 96–106)
Creatinine, Ser: 1.23 mg/dL (ref 0.76–1.27)
Globulin, Total: 2.3 g/dL (ref 1.5–4.5)
Glucose: 89 mg/dL (ref 65–99)
Potassium: 4.7 mmol/L (ref 3.5–5.2)
Sodium: 138 mmol/L (ref 134–144)
Total Protein: 6.6 g/dL (ref 6.0–8.5)
eGFR: 62 mL/min/{1.73_m2} (ref >59)

## 2020-10-14 LAB — CBC WITH DIFFERENTIAL/PLATELET
Basophils Absolute: 0 10*3/uL (ref 0.0–0.2)
Basos: 0 %
EOS (ABSOLUTE): 0.1 10*3/uL (ref 0.0–0.4)
Eos: 1 %
Hematocrit: 45.1 % (ref 37.5–51.0)
Hemoglobin: 15.3 g/dL (ref 13.0–17.7)
Immature Grans (Abs): 0 10*3/uL (ref 0.0–0.1)
Immature Granulocytes: 0 %
Lymphocytes Absolute: 2.2 10*3/uL (ref 0.7–3.1)
Lymphs: 36 %
MCH: 30.5 pg (ref 26.6–33.0)
MCHC: 33.9 g/dL (ref 31.5–35.7)
MCV: 90 fL (ref 79–97)
Monocytes Absolute: 0.4 10*3/uL (ref 0.1–0.9)
Monocytes: 6 %
Neutrophils Absolute: 3.5 10*3/uL (ref 1.4–7.0)
Neutrophils: 57 %
Platelets: 198 10*3/uL (ref 150–450)
RBC: 5.02 x10E6/uL (ref 4.14–5.80)
RDW: 14.3 % (ref 11.6–15.4)
WBC: 6.2 10*3/uL (ref 3.4–10.8)

## 2020-10-15 ENCOUNTER — Ambulatory Visit: Payer: Medicare HMO | Admitting: Family Medicine

## 2020-10-15 ENCOUNTER — Other Ambulatory Visit: Payer: Self-pay

## 2020-10-15 ENCOUNTER — Telehealth: Payer: Self-pay | Admitting: Family Medicine

## 2020-10-15 ENCOUNTER — Encounter: Payer: Self-pay | Admitting: Family Medicine

## 2020-10-15 VITALS — BP 114/80 | HR 76 | Temp 97.1°F | Ht 64.0 in | Wt 204.0 lb

## 2020-10-15 DIAGNOSIS — H9313 Tinnitus, bilateral: Secondary | ICD-10-CM

## 2020-10-15 DIAGNOSIS — R1030 Lower abdominal pain, unspecified: Secondary | ICD-10-CM

## 2020-10-15 DIAGNOSIS — R350 Frequency of micturition: Secondary | ICD-10-CM | POA: Diagnosis not present

## 2020-10-15 DIAGNOSIS — N401 Enlarged prostate with lower urinary tract symptoms: Secondary | ICD-10-CM

## 2020-10-15 LAB — POCT URINALYSIS DIP (MANUAL ENTRY)
Bilirubin, UA: NEGATIVE
Blood, UA: NEGATIVE
Glucose, UA: NEGATIVE mg/dL
Ketones, POC UA: NEGATIVE mg/dL
Leukocytes, UA: NEGATIVE
Nitrite, UA: NEGATIVE
Protein Ur, POC: NEGATIVE mg/dL
Spec Grav, UA: 1.025 (ref 1.010–1.025)
Urobilinogen, UA: NEGATIVE E.U./dL — AB
pH, UA: 6.5 (ref 5.0–8.0)

## 2020-10-15 MED ORDER — TAMSULOSIN HCL 0.4 MG PO CAPS: ORAL_CAPSULE | ORAL | 1 refills | Status: DC

## 2020-10-15 NOTE — Telephone Encounter (Signed)
Bradley [name] Mr. 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  This was dictated.  Please give him the letter.  Thank you

## 2020-10-15 NOTE — Progress Notes (Signed)
Subjective:    Patient ID: Bradley [name], male    DOB: 1945/07/26, 75 y.o.   MRN: [id number]  HPI Pt here for follow up on burning with urination. Pt also had lab work completed on 10/13/20.  His wife was present today I reviewed over the lab work with him We also reviewed over the recent visit In addition to this we also reviewed over the CAT scan and the findings present with back He has not had any night sweats since last being seen No fevers Still relates some discomfort in the lower abdomen and also relates some difficulties with urination I reviewed over with him how I would discuss his case with hematology oncology and they recommended further evaluation Wife -Ginger present Patient does suffer with tinnitus.  He states is fairly loud its been going on for many years.  Denies any dizziness with it.  Results for orders placed or performed in visit on 09/24/20  Urine Culture   Specimen: Urine   Urine  Result Value Ref Range   Urine Culture, Routine Final report (A)    Organism ID, Bacteria Escherichia coli (A)    Antimicrobial Susceptibility Comment   CBC with Differential/Platelet  Result Value Ref Range   WBC 6.2 3.4 - 10.8 x10E3/uL   RBC 5.02 4.14 - 5.80 x10E6/uL   Hemoglobin 15.3 13.0 - 17.7 g/dL   Hematocrit 45.1 37.5 - 51.0 %   MCV 90 79 - 97 fL   MCH 30.5 26.6 - 33.0 pg   MCHC 33.9 31.5 - 35.7 g/dL   RDW 14.3 11.6 - 15.4 %   Platelets 198 150 - 450 x10E3/uL   Neutrophils 57 Not Estab. %   Lymphs 36 Not Estab. %   Monocytes 6 Not Estab. %   Eos 1 Not Estab. %   Basos 0 Not Estab. %   Neutrophils Absolute 3.5 1.4 - 7.0 x10E3/uL   Lymphocytes Absolute 2.2 0.7 - 3.1 x10E3/uL   Monocytes Absolute 0.4 0.1 - 0.9 x10E3/uL   EOS (ABSOLUTE) 0.1 0.0 - 0.4 x10E3/uL   Basophils Absolute 0.0 0.0 - 0.2 x10E3/uL   Immature Granulocytes 0 Not Estab. %   Immature Grans (Abs) 0.0 0.0 - 0.1 x10E3/uL  Comprehensive metabolic panel  Result Value Ref Range   Glucose 89 65  - 99 mg/dL   BUN 14 8 - 27 mg/dL   Creatinine, Ser 1.23 0.76 - 1.27 mg/dL   eGFR 62 >59 mL/min/1.73   BUN/Creatinine Ratio 11 10 - 24   Sodium 138 134 - 144 mmol/L   Potassium 4.7 3.5 - 5.2 mmol/L   Chloride 102 96 - 106 mmol/L   CO2 22 20 - 29 mmol/L   Calcium 9.3 8.6 - 10.2 mg/dL   Total Protein 6.6 6.0 - 8.5 g/dL   Albumin 4.3 3.7 - 4.7 g/dL   Globulin, Total 2.3 1.5 - 4.5 g/dL   Albumin/Globulin Ratio 1.9 1.2 - 2.2   Bilirubin Total 0.4 0.0 - 1.2 mg/dL   Alkaline Phosphatase 105 44 - 121 IU/L   AST 32 0 - 40 IU/L   ALT 32 0 - 44 IU/L  POCT Urinalysis Dipstick  Result Value Ref Range   Color, UA     Clarity, UA     Glucose, UA     Bilirubin, UA +    Ketones, UA     Spec Grav, UA 1.025 1.010 - 1.025   Blood, UA +    pH, UA 5.0 5.0 -  8.0   Protein, UA Positive (A) Negative   Urobilinogen, UA 0.2 0.2 or 1.0 E.U./dL   Nitrite, UA +    Leukocytes, UA Small (1+) (A) Negative   Appearance     Odor +     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  Throat is normal ears are normal abdomen is soft no guarding rebound or masses axilla no lymph nodes are noted       Assessment & Plan:  Tinnitus   1. Urine frequency So it is felt that urinary frequency more than likely related to the prostate or what is happening in the lower abdomen patient will go up on the Flomax 2 tablets each evening if that causes dizziness go back to 1 tablet - POCT urinalysis dipstick - Lactate dehydrogenase; Future - PSA - Protein Electrophoresis, (serum) - PE and FLC, Serum - Ambulatory referral to Oncology  2. 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  If he has progressive troubles or worsening issues ENT evaluation would be necessary - Lactate dehydrogenase; Future - PSA - Protein Electrophoresis, (serum) - PE and FLC, Serum - Ambulatory referral to Oncology  3. Lower abdominal pain Had significant lower abdominal pain several weeks ago now doing a little bit better but his CAT scan from back in February shows possibility of vasculitis versus lymphoma needs further work-up we will initiate the lab work but patient needs to be seen by oncology for further evaluation and PET scan - Lactate dehydrogenase; Future - PSA - Protein Electrophoresis, (serum) - PE and FLC, Serum - Ambulatory referral to Oncology  4. Benign prostatic hyperplasia with urinary frequency Please see above - Lactate dehydrogenase; Future - PSA - Protein Electrophoresis, (serum) - PE and FLC, Serum - Ambulatory referral to Oncology

## 2020-10-17 LAB — PE AND FLC, SERUM
A/G Ratio: 1.7 (ref 0.7–1.7)
Albumin ELP: 4.2 g/dL (ref 2.9–4.4)
Alpha 1: 0.2 g/dL (ref 0.0–0.4)
Alpha 2: 0.8 g/dL (ref 0.4–1.0)
Beta: 0.8 g/dL (ref 0.7–1.3)
Gamma Globulin: 0.8 g/dL (ref 0.4–1.8)
Globulin, Total: 2.5 g/dL (ref 2.2–3.9)
Ig Kappa Free Light Chain: 12.9 mg/L (ref 3.3–19.4)
Ig Lambda Free Light Chain: 9 mg/L (ref 5.7–26.3)
KAPPA/LAMBDA RATIO: 1.43 (ref 0.26–1.65)
Total Protein: 6.7 g/dL (ref 6.0–8.5)

## 2020-10-17 LAB — PSA: Prostate Specific Ag, Serum: 2.8 ng/mL (ref 0.0–4.0)

## 2020-10-17 LAB — PROTEIN ELECTROPHORESIS, SERUM

## 2020-10-20 ENCOUNTER — Encounter (HOSPITAL_COMMUNITY): Payer: Self-pay

## 2020-10-20 NOTE — Progress Notes (Signed)
I called the patient today for an introductory phone call. I introduced myself and explained my role in the patient's care. I provided my contact information and encouraged the patient to call with questions or concerns.

## 2020-10-22 ENCOUNTER — Encounter (HOSPITAL_COMMUNITY): Payer: Self-pay | Admitting: Hematology

## 2020-10-22 ENCOUNTER — Inpatient Hospital Stay (HOSPITAL_COMMUNITY): Payer: Medicare HMO | Attending: Hematology | Admitting: Hematology

## 2020-10-22 ENCOUNTER — Other Ambulatory Visit: Payer: Self-pay

## 2020-10-22 ENCOUNTER — Inpatient Hospital Stay (HOSPITAL_COMMUNITY): Payer: Medicare HMO

## 2020-10-22 VITALS — BP 126/84 | HR 58 | Temp 97.0°F | Resp 18 | Ht 64.0 in | Wt 204.9 lb

## 2020-10-22 DIAGNOSIS — R1900 Intra-abdominal and pelvic swelling, mass and lump, unspecified site: Secondary | ICD-10-CM | POA: Diagnosis not present

## 2020-10-22 DIAGNOSIS — Z800 Family history of malignant neoplasm of digestive organs: Secondary | ICD-10-CM | POA: Diagnosis not present

## 2020-10-22 DIAGNOSIS — Z8042 Family history of malignant neoplasm of prostate: Secondary | ICD-10-CM | POA: Diagnosis not present

## 2020-10-22 DIAGNOSIS — N135 Crossing vessel and stricture of ureter without hydronephrosis: Secondary | ICD-10-CM | POA: Insufficient documentation

## 2020-10-22 DIAGNOSIS — E669 Obesity, unspecified: Secondary | ICD-10-CM | POA: Diagnosis not present

## 2020-10-22 DIAGNOSIS — Z808 Family history of malignant neoplasm of other organs or systems: Secondary | ICD-10-CM | POA: Diagnosis present

## 2020-10-22 DIAGNOSIS — R5383 Other fatigue: Secondary | ICD-10-CM | POA: Insufficient documentation

## 2020-10-22 DIAGNOSIS — R935 Abnormal findings on diagnostic imaging of other abdominal regions, including retroperitoneum: Secondary | ICD-10-CM | POA: Diagnosis not present

## 2020-10-22 DIAGNOSIS — R300 Dysuria: Secondary | ICD-10-CM | POA: Diagnosis not present

## 2020-10-22 LAB — TSH: TSH: 1.706 u[IU]/mL (ref 0.350–4.500)

## 2020-10-22 LAB — C-REACTIVE PROTEIN: CRP: 0.6 mg/dL (ref <1.0)

## 2020-10-22 LAB — LACTATE DEHYDROGENASE: LDH: 131 U/L (ref 98–192)

## 2020-10-22 LAB — SEDIMENTATION RATE: Sed Rate: 2 mm/hr (ref 0–16)

## 2020-10-22 NOTE — Patient Instructions (Addendum)
[facility] at [facility] Discharge Instructions  You were seen and examined today by Dr. Delton Coombes. Dr. Delton Coombes is a medical oncologist, meaning he specializes in the medical management of cancer diagnoses with medications. Dr. Delton Coombes discussed your past medical history, family history of cancer and the events that led to you being here today.  You were referred to Dr. Delton Coombes following your recent CT scan. The CT scan revealed a thickening tissue around your aorta, which is the major artery in your abdomen. This could be concerning for a type of blood cancer known as lymphoma. This could very well be related to a benign connective tissue disorder. Dr. Delton Coombes has recommended ruling out Lymphoma and Colon Cancer. Dr. Wolfgang Phoenix has ruled out Multiple Myeloma - this is good news! Clinically, Dr. Delton Coombes does not see anything obvious of cancer at this time.  Dr. Delton Coombes has recommended additional lab work today.  Follow-up as scheduled.   Thank you for choosing [place] at [facility] to provide your oncology and hematology care.  To afford each patient quality time with our provider, please arrive at least 15 minutes before your scheduled appointment time.   If you have a lab appointment with the [facility] please come in thru the Main Entrance and check in at the main information desk.  You need to re-schedule your appointment should you arrive 10 or more minutes late.  We strive to give you quality time with our providers, and arriving late affects you and other patients whose appointments are after yours.  Also, if you no show three or more times for appointments you may be dismissed from the clinic at the providers discretion.     Again, thank you for choosing [facility].  Our hope is that these requests will decrease the amount of time that you wait before being seen by our physicians.        _____________________________________________________________  Should you have questions after your visit to [facility], please contact our office at [phone] and follow the prompts.  Our office hours are 8:00 a.m. and 4:30 p.m. Monday - Friday.  Please note that voicemails left after 4:00 p.m. may not be returned until the following business day.  We are closed weekends and major holidays.  You do have access to a nurse 24-7, just call the main number to the clinic [phone] and do not press any options, hold on the line and a nurse will answer the phone.    For prescription refill requests, have your pharmacy contact our office and allow 72 hours.    Due to Covid, you will need to wear a mask upon entering the hospital. If you do not have a mask, a mask will be given to you at the Main Entrance upon arrival. For doctor visits, patients may have 1 support person age 29 or older with them. For treatment visits, patients can not have anyone with them due to social distancing guidelines and our immunocompromised population.

## 2020-10-22 NOTE — Progress Notes (Signed)
[place] [facility] [address] [place] [address]   CLINIC:  Medical Oncology/Hematology  CONSULT NOTE  Patient Care Team: Kathyrn Drown, MD as PCP - General (Family Medicine) Brien Mates, RN as Oncology Nurse Navigator  CHIEF COMPLAINTS/PURPOSE OF CONSULTATION:  Evaluation of lower abdominal soft tissue mass  HISTORY OF PRESENTING ILLNESS:  [name] 75 y.o. male is here because of evaluation of lower abdominal soft tissue mass, at the request of Dr. Sallee Lange. He had a CT AP done on 09/04/2020 which showed an abnormal soft tissue surrounding the infrarenal abdominal aorta.  Today he is accompanied by his wife and he reports feeling well. He denies having any changes to his overall health in the past 6 months, besides the UTI that he developed on 02/16 and treated in the ED. He then took Bactrim for 3 weeks and after he finished the course, he has not been having any more issues for the past 1 week. He also started taking Flomax 2 tablets QHS since 03/10 and his stream improved. He denies having any F/C, night sweats or hematuria; he stopped drinking sodas to lose weight and lost 35 lbs over 6 months. His energy levels are good. He denies having history of lupus or connective tissue disease. He is not taking any herbal supplements.  His brother had prostate cancer which metastasized to the bones; his mother had gastric cancer; his maternal cousin had esophageal cancer. He used to work for the [employer] and denies chemical exposure; he uses [employer] but wears gloves and aerator. He stays active and works in his garden. He does not smoke or drink.  MEDICAL HISTORY:  Past Medical History:  Diagnosis Date  . Barrett esophagus    History of  . GERD (gastroesophageal reflux disease)   . Gout   . History of adenomatous polyp of colon   . Hyperlipidemia   . Hypertension   . 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 Shingles    hx of, on back of neck  .  Sleep apnea    Noncompliant with CPAP  . Trigeminal neuralgia     SURGICAL HISTORY: Past Surgical History:  Procedure Laterality Date  . BIOPSY  08/02/2018   Procedure: BIOPSY;  Surgeon: Daneil Dolin, MD;  [facility];  Service: Endoscopy;;  gastric polyp  . COLONOSCOPY    . COLONOSCOPY  12/09/2004   XBJ:YNWGNFAOZH polyp at 35 cm biopsied/normal rectum  . COLONOSCOPY   05/20/2008   YQM:VHQIO midsigmoid s/p snare removal/normal rectum. Tubular adenoma  . COLONOSCOPY N/A 08/02/2018   Procedure: COLONOSCOPY;  Surgeon: Daneil Dolin, MD;  Location: [facility];  Service: Endoscopy;  Laterality: N/A;  1:15pm  . COLONOSCOPY WITH ESOPHAGOGASTRODUODENOSCOPY (EGD) N/A 03/22/2013   Dr. Gala Romney: Barrett's esophagus status post segmental biopsies with no evidence of dysplasia.  Normal colonoscopy.  Next colonoscopy in 5 years.  . ESOPHAGOGASTRODUODENOSCOPY  07/01/2003   NGE:XBMWUX colored epithelium involving essentially nearly the distal onehalf of the tubular esophagus/Patulous esophagogastric junction/ Moderate sized hiatal hernia/Otherwise normal stomach, normal D1 and D2.  . ESOPHAGOGASTRODUODENOSCOPY  12/09/2004   LKG:MWNUUV-[id number] epithelium  beginning at 33 cm consistent with previous diagnosis of Barrett's esophagus  segmentally biopsied. Hiatal hernia. Otherwise normal stomach. Normal D1 and D2  . ESOPHAGOGASTRODUODENOSCOPY   05/20/2008   IHK:VQQVZD-[id number] epithelium distal esophagus consistent with Barrett's esophagus.  No significant change from prior exam. Multiple biopsies negative for dysplasia.  Marland Kitchen ESOPHAGOGASTRODUODENOSCOPY N/A 08/02/2018  Procedure: ESOPHAGOGASTRODUODENOSCOPY (EGD);  Surgeon: Daneil Dolin, MD;  [facility];  Service: Endoscopy;  Laterality: N/A;  . POLYPECTOMY  08/02/2018   Procedure: POLYPECTOMY;  Surgeon: Daneil Dolin, MD;  [facility];  Service: Endoscopy;;  distal transverse colon(CSx1), rectal(CSx1)  . TONSILLECTOMY       SOCIAL HISTORY: Social History   Socioeconomic History  . Marital status: Married    Spouse name: Merchandiser, retail  . Number of children: 2  . Years of education: 58  . Highest education level: Not on file  Occupational History  . Occupation: Retired    Comment: Hotel manager  Tobacco Use  . Smoking status: Never Smoker  . Smokeless tobacco: Never Used  Substance and Sexual Activity  . Alcohol use: No  . Drug use: No  . Sexual activity: Not on file  Other Topics Concern  . Not on file  Social History Narrative   Lives with wife   2 children, 1 passed away   caffeine- coffee 1-2 daily   Social Determinants of Health   Financial Resource Strain: Low Risk   . Difficulty of Paying Living Expenses: Not very hard  Food Insecurity: No Food Insecurity  . Worried About Charity fundraiser in the Last Year: Never true  . Ran Out of Food in the Last Year: Never true  Transportation Needs: No Transportation Needs  . Lack of Transportation (Medical): No  . Lack of Transportation (Non-Medical): No  Physical Activity: Sufficiently Active  . Days of Exercise per Week: 4 days  . Minutes of Exercise per Session: 40 min  Stress: No Stress Concern Present  . Feeling of Stress : Not at all  Social Connections: Socially Integrated  . Frequency of Communication with Friends and Family: More than three times a week  . Frequency of Social Gatherings with Friends and Family: More than three times a week  . Attends Religious Services: More than 4 times per year  . Active Member of Clubs or Organizations: Yes  . Attends Archivist Meetings: Never  . Marital Status: Married  Human resources officer Violence: Not At Risk  . Fear of Current or Ex-Partner: No  . Emotionally Abused: No  . Physically Abused: No  . Sexually Abused: No    FAMILY HISTORY: Family History  Problem Relation Age of Onset  . Heart attack Father 78       First MI in 29s  . Cancer Brother        prostate  .  Colon cancer Mother        age greater than 31s  . Cancer Sister        brain  . COPD Brother     ALLERGIES:  has No Known Allergies.  MEDICATIONS:  Current Outpatient Medications  Medication Sig Dispense Refill  . allopurinol (ZYLOPRIM) 100 MG tablet Take 100 mg by mouth daily after breakfast.     . ALPRAZolam (XANAX) 1 MG tablet Take 1 mg by mouth 2 (two) times daily.    Marland Kitchen aspirin EC 81 MG tablet Take 81 mg by mouth at bedtime.     Marland Kitchen atorvastatin (LIPITOR) 40 MG tablet Take 40 mg by mouth every evening.     . Cyanocobalamin (B-12 IJ) Inject 1 mL as directed every 30 (thirty) days.     . diphenhydrAMINE (BENADRYL) 25 MG tablet Take 25 mg by mouth every 6 (six) hours as needed for allergies or sleep.     Marland Kitchen  furosemide (LASIX) 20 MG tablet Take 20 mg by mouth daily as needed for fluid or edema.    . gabapentin (NEURONTIN) 300 MG capsule Take 3 capsules (900 mg total) by mouth 2 (two) times daily. 180 capsule 5  . lisinopril (ZESTRIL) 40 MG tablet Take 40 mg by mouth daily.    . naproxen sodium (ANAPROX) 275 MG tablet Take 275 mg by mouth 2 (two) times daily with a meal.    . omeprazole (PRILOSEC) 20 MG capsule Take 20 mg by mouth daily after breakfast.     . PRESCRIPTION MEDICATION carbemazepine    . pyridostigmine (MESTINON) 60 MG tablet Take 1 tablet (60 mg total) by mouth 2 (two) times daily. 270 tablet 4  . sildenafil (VIAGRA) 100 MG tablet Take 100 mg by mouth daily as needed for erectile dysfunction.    . SODIUM FLUORIDE 5000 PPM 1.1 % PSTE Take by mouth.    . sulfamethoxazole-trimethoprim (BACTRIM DS) 800-160 MG tablet Take 1 tablet by mouth 2 (two) times daily. 42 tablet 0  . tamsulosin (FLOMAX) 0.4 MG CAPS capsule 2 qhs for BPH 180 capsule 1  . Vitamin D, Cholecalciferol, 25 MCG (1000 UT) CAPS Take by mouth.     No current facility-administered medications for this visit.    REVIEW OF SYSTEMS:   Review of Systems  Constitutional: Positive for fatigue (75%). Negative for  appetite change, chills, diaphoresis, fever and unexpected weight change.  Genitourinary: Positive for difficulty urinating (improving). Negative for hematuria.   All other systems reviewed and are negative.    PHYSICAL EXAMINATION: ECOG PERFORMANCE STATUS: 0 - Asymptomatic  Vitals:   10/22/20 1323  BP: 126/84  Pulse: (!) 58  Resp: 18  Temp: (!) 97 F (36.1 C)  SpO2: 97%   Filed Weights   10/22/20 1323  Weight: 204 lb 14.4 oz (92.9 kg)   Physical Exam Vitals reviewed.  Constitutional:      Appearance: Normal appearance. He is obese.  Cardiovascular:     Rate and Rhythm: Normal rate and regular rhythm.     Pulses: Normal pulses.     Heart sounds: Normal heart sounds.  Pulmonary:     Effort: Pulmonary effort is normal.     Breath sounds: Normal breath sounds.  Chest:  Breasts:     Right: No axillary adenopathy or supraclavicular adenopathy.     Left: No axillary adenopathy or supraclavicular adenopathy.    Abdominal:     Palpations: Abdomen is soft. There is no mass.     Tenderness: There is no abdominal tenderness.     Hernia: No hernia is present.  Musculoskeletal:     Right lower leg: No edema.     Left lower leg: No edema.  Lymphadenopathy:     Cervical: No cervical adenopathy.     Upper Body:     Right upper body: No supraclavicular, axillary or pectoral adenopathy.     Left upper body: No supraclavicular, axillary or pectoral adenopathy.     Lower Body: No right inguinal adenopathy. No left inguinal adenopathy.  Neurological:     General: No focal deficit present.     Mental Status: He is alert and oriented to person, place, and time.  Psychiatric:        Mood and Affect: Mood normal.        Behavior: Behavior normal.      LABORATORY DATA:  I have reviewed the data as listed CBC Latest Ref Rng & Units 10/13/2020 09/03/2020 05/07/2013  WBC 3.4 - 10.8 x10E3/uL 6.2 17.7(H) 6.9  Hemoglobin 13.0 - 17.7 g/dL 15.3 15.7 16.0  Hematocrit 37.5 - 51.0 % 45.1  45.1 46.0  Platelets 150 - 450 x10E3/uL 198 188 205   CMP Latest Ref Rng & Units 10/15/2020 10/13/2020 09/03/2020  Glucose 65 - 99 mg/dL - 89 105(H)  BUN 8 - 27 mg/dL - 14 17  Creatinine 0.76 - 1.27 mg/dL - 1.23 1.30(H)  Sodium 134 - 144 mmol/L - 138 132(L)  Potassium 3.5 - 5.2 mmol/L - 4.7 3.7  Chloride 96 - 106 mmol/L - 102 99  CO2 20 - 29 mmol/L - 22 24  Calcium 8.6 - 10.2 mg/dL - 9.3 8.9  Total Protein 6.0 - 8.5 g/dL 6.7 6.6 -  Total Bilirubin 0.0 - 1.2 mg/dL - 0.4 -  Alkaline Phos 44 - 121 IU/L - 105 -  AST 0 - 40 IU/L - 32 -  ALT 0 - 44 IU/L - 32 -   Lab Results  Component Value Date   ALBUMINELP 4.2 10/15/2020   MSPIKE Not Observed 10/15/2020    No results found for: KPAFRELGTCHN, LAMBDASER, KAPLAMBRATIO   RADIOGRAPHIC STUDIES: I have personally reviewed the radiological images as listed and agreed with the findings in the report. No results found.  ASSESSMENT:  1.  Retroperitoneal fibrosis: -He was evaluated for UTI in the ER. -CT renal stone study on 09/03/2020 showed abnormal soft tissue surrounding the infrarenal abdominal aorta and along both common iliac arteries, right greater than left.  5 mm nonobstructing stone in the lower pole of the left kidney.  5.9 cm lower pole left renal cyst. -CTAP with contrast on 09/03/2020 shows abnormal soft tissue surrounding the aorta extending from the level of the renal arteries to the iliac bifurcation.  This extends to involve the proximal common iliac arteries. -Denied any fevers, night sweats.  35 pound weight loss/6 to 8 months, intentional.  He quit drinking sodas. -No personal or family history of connective tissue disorders.  2.  Social/family history: -He worked in the [employer].  No exposure to chemicals.  No history of smoking. -Brother had metastatic prostate cancer.  Mother had stomach cancer.  Maternal cousin had esophageal cancer.    PLAN:  1.  Retroperitoneal fibrosis: -We have reviewed images of  the CT scan with the patient and his wife in detail. -We have reviewed labs ordered by Dr. Wolfgang Phoenix on 10/15/2020 which showed SPEP was normal.  Free light chain ratio was normal.  PSA was 2.8. -We discussed differential diagnosis of the CT scan findings which includes vasculitis, autoimmune problems, malignancies. -We will check LDH, CEA level, ESR, CRP, serum immunoglobulins, IgG4, TSH, ANA, antithyroglobulin antibodies, anti-smooth muscle antibodies. -If there is any suspicion of lymphoma or malignancy will consider doing PET CT scan. -RTC 2 to 3 weeks to discuss results.    All questions were answered. The patient knows to call the clinic with any problems, questions or concerns.  Derek Jack, MD, 10/22/20 2:35 PM  [facility] [phone]   I, Milinda Antis, am acting as a scribe for Dr. Sanda Linger.  I, Derek Jack MD, have reviewed the above documentation for accuracy and completeness, and I agree with the above.

## 2020-10-23 LAB — ANTI-SMOOTH MUSCLE ANTIBODY, IGG: F-Actin IgG: 7 Units (ref 0–19)

## 2020-10-23 LAB — ANTINUCLEAR ANTIBODIES, IFA: ANA Ab, IFA: NEGATIVE

## 2020-10-23 LAB — THYROGLOBULIN ANTIBODY: Thyroglobulin Antibody: <1 IU/mL (ref 0.0–0.9)

## 2020-10-23 LAB — CEA: CEA: 1.2 ng/mL (ref 0.0–4.7)

## 2020-10-24 LAB — IGG 4: IgG, Subclass 4: 47 mg/dL (ref 2–96)

## 2020-10-28 LAB — IMMUNOGLOBULINS A/E/G/M, SERUM
IgA: 105 mg/dL (ref 61–437)
IgE (Immunoglobulin E), Serum: 123 IU/mL (ref 6–495)
IgG (Immunoglobin G), Serum: 758 mg/dL (ref 603–1613)
IgM (Immunoglobulin M), Srm: 42 mg/dL (ref 15–143)

## 2020-11-10 ENCOUNTER — Inpatient Hospital Stay (HOSPITAL_BASED_OUTPATIENT_CLINIC_OR_DEPARTMENT_OTHER): Payer: Medicare HMO | Admitting: Hematology

## 2020-11-10 VITALS — BP 123/78 | HR 67 | Temp 97.8°F | Resp 17 | Wt 205.1 lb

## 2020-11-10 DIAGNOSIS — N135 Crossing vessel and stricture of ureter without hydronephrosis: Secondary | ICD-10-CM

## 2020-11-10 NOTE — Progress Notes (Signed)
[address], [place] [address]   CLINIC:  Medical Oncology/Hematology  PCP:  Kathyrn Drown, MD [address] [place] / [place] [address] [phone]   REASON FOR VISIT:  Follow-up for retroperitoneal fibrosis  PRIOR THERAPY: None  NGS Results: Not done  CURRENT THERAPY: Observation  BRIEF ONCOLOGIC HISTORY:  Oncology History   No history exists.    CANCER STAGING: Cancer Staging No matching staging information was found for the patient.  INTERVAL HISTORY:  [name], a 75 y.o. 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 Bradley [name] was last seen on 10/22/2020.   Today he is accompanied by his wife and he reports feeling okay. He denies having any new back pain, abdominal pain, F/C, night sweats or weight loss.   REVIEW OF SYSTEMS:  Review of Systems  Constitutional: Positive for fatigue (50%). Negative for appetite change, chills, diaphoresis and fever.  HENT:  Negative.   Eyes: Negative.   Respiratory: Negative.   Cardiovascular: Negative.   Gastrointestinal: Negative.  Negative for abdominal pain.  Endocrine: Negative.   Genitourinary: Negative.    Musculoskeletal: Negative.  Negative for back pain.  Skin: Negative.   Neurological: Negative.   Hematological: Negative.   Psychiatric/Behavioral: Negative.   All other systems reviewed and are negative.   PAST MEDICAL/SURGICAL HISTORY:  Past Medical History:  Diagnosis Date  . Barrett esophagus    History of  . GERD (gastroesophageal reflux disease)   . Gout   . History of adenomatous polyp of colon   . Hyperlipidemia   . Hypertension   . Ocular myasthenia (Johnson)   . Shingles    hx of, on back of neck  . Sleep apnea    Noncompliant with CPAP  . Trigeminal neuralgia    Past Surgical History:  Procedure Laterality Date  . BIOPSY  08/02/2018   Procedure: BIOPSY;  Surgeon: Daneil Dolin, MD;  [facility];   Service: Endoscopy;;  gastric polyp  . COLONOSCOPY    . COLONOSCOPY  12/09/2004   BEE:FEOFHQRFXJ polyp at 35 cm biopsied/normal rectum  . COLONOSCOPY   05/20/2008   OIT:GPQDI midsigmoid s/p snare removal/normal rectum. Tubular adenoma  . COLONOSCOPY N/A 08/02/2018   Procedure: COLONOSCOPY;  Surgeon: Daneil Dolin, MD;  [facility];  Service: Endoscopy;  Laterality: N/A;  1:15pm  . COLONOSCOPY WITH ESOPHAGOGASTRODUODENOSCOPY (EGD) N/A 03/22/2013   Dr. Gala Romney: Barrett's esophagus status post segmental biopsies with no evidence of dysplasia.  Normal colonoscopy.  Next colonoscopy in 5 years.  . ESOPHAGOGASTRODUODENOSCOPY  07/01/2003   YME:BRAXEN colored epithelium involving essentially nearly the distal onehalf of the tubular esophagus/Patulous esophagogastric junction/ Moderate sized hiatal hernia/Otherwise normal stomach, normal D1 and D2.  . ESOPHAGOGASTRODUODENOSCOPY  12/09/2004   MMH:[id number]-JSRPRXY epithelium  beginning at 33 cm consistent with previous diagnosis of Barrett's esophagus  segmentally biopsied. Hiatal hernia. Otherwise normal stomach. Normal D1 and D2  . ESOPHAGOGASTRODUODENOSCOPY   05/20/2008   VOP:[id number] epithelium distal esophagus consistent with Barrett's esophagus.  No significant change from prior exam. Multiple biopsies negative for dysplasia.  Marland Kitchen ESOPHAGOGASTRODUODENOSCOPY N/A 08/02/2018   Procedure: ESOPHAGOGASTRODUODENOSCOPY (EGD);  Surgeon: Daneil Dolin, MD;  [facility];  Service: Endoscopy;  Laterality: N/A;  . POLYPECTOMY  08/02/2018   Procedure: POLYPECTOMY;  Surgeon: Daneil Dolin, MD;  [facility];  Service: Endoscopy;;  distal transverse colon(CSx1), rectal(CSx1)  . TONSILLECTOMY      SOCIAL  HISTORY:  Social History   Socioeconomic History  . Marital status: Married    Spouse name: Merchandiser, retail  . Number of children: 2  . Years of education: 12  . Highest education level: Not on file  Occupational History  .  Occupation: Retired    Comment: Hotel manager  Tobacco Use  . Smoking status: Never Smoker  . Smokeless tobacco: Never Used  Substance and Sexual Activity  . Alcohol use: No  . Drug use: No  . Sexual activity: Not on file  Other Topics Concern  . Not on file  Social History Narrative   Lives with wife   2 children, 1 passed away   caffeine- coffee 1-2 daily   Social Determinants of Health   Financial Resource Strain: Low Risk   . Difficulty of Paying Living Expenses: Not very hard  Food Insecurity: No Food Insecurity  . Worried About Charity fundraiser in the Last Year: Never true  . Ran Out of Food in the Last Year: Never true  Transportation Needs: No Transportation Needs  . Lack of Transportation (Medical): No  . Lack of Transportation (Non-Medical): No  Physical Activity: Sufficiently Active  . Days of Exercise per Week: 4 days  . Minutes of Exercise per Session: 40 min  Stress: No Stress Concern Present  . Feeling of Stress : Not at all  Social Connections: Socially Integrated  . Frequency of Communication with Friends and Family: More than three times a week  . Frequency of Social Gatherings with Friends and Family: More than three times a week  . Attends Religious Services: More than 4 times per year  . Active Member of Clubs or Organizations: Yes  . Attends Archivist Meetings: Never  . Marital Status: Married  Human resources officer Violence: Not At Risk  . Fear of Current or Ex-Partner: No  . Emotionally Abused: No  . Physically Abused: No  . Sexually Abused: No    FAMILY HISTORY:  Family History  Problem Relation Age of Onset  . Heart attack Father 39       First MI in 86s  . Cancer Brother        prostate  . Colon cancer Mother        age greater than 31s  . Cancer Sister        brain  . COPD Brother     CURRENT MEDICATIONS:  Current Outpatient Medications  Medication Sig Dispense Refill  . allopurinol (ZYLOPRIM) 100 MG tablet  Take 100 mg by mouth daily after breakfast.     . ALPRAZolam (XANAX) 1 MG tablet Take 1 mg by mouth 2 (two) times daily.    Marland Kitchen aspirin EC 81 MG tablet Take 81 mg by mouth at bedtime.     Marland Kitchen atorvastatin (LIPITOR) 40 MG tablet Take 40 mg by mouth every evening.     . Cyanocobalamin (B-12 IJ) Inject 1 mL as directed every 30 (thirty) days.     . diphenhydrAMINE (BENADRYL) 25 MG tablet Take 25 mg by mouth every 6 (six) hours as needed for allergies or sleep.     . furosemide (LASIX) 20 MG tablet Take 20 mg by mouth daily as needed for fluid or edema.    . gabapentin (NEURONTIN) 300 MG capsule Take 3 capsules (900 mg total) by mouth 2 (two) times daily. 180 capsule 5  . lisinopril (ZESTRIL) 40 MG tablet Take 40 mg by mouth daily.    . naproxen sodium (  ANAPROX) 275 MG tablet Take 275 mg by mouth 2 (two) times daily with a meal.    . omeprazole (PRILOSEC) 20 MG capsule Take 20 mg by mouth daily after breakfast.     . PRESCRIPTION MEDICATION carbemazepine    . pyridostigmine (MESTINON) 60 MG tablet Take 1 tablet (60 mg total) by mouth 2 (two) times daily. 270 tablet 4  . sildenafil (VIAGRA) 100 MG tablet Take 100 mg by mouth daily as needed for erectile dysfunction.    . SODIUM FLUORIDE 5000 PPM 1.1 % PSTE Take by mouth.    . sulfamethoxazole-trimethoprim (BACTRIM DS) 800-160 MG tablet Take 1 tablet by mouth 2 (two) times daily. 42 tablet 0  . tamsulosin (FLOMAX) 0.4 MG CAPS capsule 2 qhs for BPH 180 capsule 1  . Vitamin D, Cholecalciferol, 25 MCG (1000 UT) CAPS Take by mouth.     No current facility-administered medications for this visit.    ALLERGIES:  No Known Allergies  PHYSICAL EXAM:  Performance status (ECOG): 0 - Asymptomatic  Vitals:   11/10/20 1500  BP: 123/78  Pulse: 67  Resp: 17  Temp: 97.8 F (36.6 C)  SpO2: 96%   Wt Readings from Last 3 Encounters:  11/10/20 205 lb 2 oz (93 kg)  10/22/20 204 lb 14.4 oz (92.9 kg)  10/15/20 204 lb (92.5 kg)   Physical Exam Vitals  reviewed.  Constitutional:      Appearance: Normal appearance. He is obese.  Cardiovascular:     Rate and Rhythm: Normal rate and regular rhythm.     Pulses: Normal pulses.     Heart sounds: Normal heart sounds.  Pulmonary:     Effort: Pulmonary effort is normal.     Breath sounds: Normal breath sounds.  Abdominal:     Palpations: Abdomen is soft. There is no hepatomegaly or mass.     Tenderness: There is no abdominal tenderness.  Musculoskeletal:     Right lower leg: No edema.     Left lower leg: No edema.  Neurological:     General: No focal deficit present.     Mental Status: He is alert and oriented to person, place, and time.  Psychiatric:        Mood and Affect: Mood normal.        Behavior: Behavior normal.      LABORATORY DATA:  I have reviewed the labs as listed.  CBC Latest Ref Rng & Units 10/13/2020 09/03/2020 05/07/2013  WBC 3.4 - 10.8 x10E3/uL 6.2 17.7(H) 6.9  Hemoglobin 13.0 - 17.7 g/dL 15.3 15.7 16.0  Hematocrit 37.5 - 51.0 % 45.1 45.1 46.0  Platelets 150 - 450 x10E3/uL 198 188 205   CMP Latest Ref Rng & Units 10/15/2020 10/13/2020 09/03/2020  Glucose 65 - 99 mg/dL - 89 105(H)  BUN 8 - 27 mg/dL - 14 17  Creatinine 0.76 - 1.27 mg/dL - 1.23 1.30(H)  Sodium 134 - 144 mmol/L - 138 132(L)  Potassium 3.5 - 5.2 mmol/L - 4.7 3.7  Chloride 96 - 106 mmol/L - 102 99  CO2 20 - 29 mmol/L - 22 24  Calcium 8.6 - 10.2 mg/dL - 9.3 8.9  Total Protein 6.0 - 8.5 g/dL 6.7 6.6 -  Total Bilirubin 0.0 - 1.2 mg/dL - 0.4 -  Alkaline Phos 44 - 121 IU/L - 105 -  AST 0 - 40 IU/L - 32 -  ALT 0 - 44 IU/L - 32 -    DIAGNOSTIC IMAGING:  I have independently reviewed the  scans and discussed with the patient. No results found.   ASSESSMENT:  1.  Retroperitoneal fibrosis: -He was evaluated for UTI in the ER. -CT renal stone study on 09/03/2020 showed abnormal soft tissue surrounding the infrarenal abdominal aorta and along both common iliac arteries, right greater than left.  5 mm  nonobstructing stone in the lower pole of the left kidney.  5.9 cm lower pole left renal cyst. -CTAP with contrast on 09/03/2020 shows abnormal soft tissue surrounding the aorta extending from the level of the renal arteries to the iliac bifurcation.  This extends to involve the proximal common iliac arteries. -Denied any fevers, night sweats.  35 pound weight loss/6 to 8 months, intentional.  He quit drinking sodas. -No personal or family history of connective tissue disorders. - SPEP on 10/15/2020 was normal.  Free light chain ratio normal.  PSA 2.8.  Immunofixation was normal. - Labs on 10/22/2020-LDH normal.  CRP and ESR normal.  TSH and thyroglobulin antibodies were negative.  ANA and rheumatoid factor was negative.  CEA was 1.2.  Immunoglobulin levels and IgG subclass 4 were normal.  2.  Social/family history: -He worked in the [place].  No exposure to chemicals.  No history of smoking. -Brother had metastatic prostate cancer.  Mother had stomach cancer.  Maternal cousin had esophageal cancer.   PLAN:  1.  Retroperitoneal fibrosis: -We discussed lab results in detail.  No evidence of autoimmune problems or elevated inflammatory markers.  TSH, thyroglobulin antibody, CEA are within normal limits. - LDH was normal.  No evidence of lymphoma at this time clinically.  No indication for doing PET scan at this time. - Recommend follow-up in 4 months with repeat CTAP with contrast.  We will also repeat LDH along with routine labs at that time.   Orders placed this encounter:  No orders of the defined types were placed in this encounter.    Derek Jack, MD [place] [phone]   I, Milinda Antis, am acting as a scribe for Dr. Sanda Linger.  I, Derek Jack MD, have reviewed the above documentation for accuracy and completeness, and I agree with the above.

## 2020-11-10 NOTE — Patient Instructions (Signed)
[place] at [facility] Discharge Instructions  You were seen today by Dr. Delton Coombes. He went over your recent results and scans. You will be scheduled to have a CT scan of our abdomen before your next visit. Dr. Delton Coombes will see you back in 4 months for labs and follow up.   Thank you for choosing [place] at [facility] to provide your oncology and hematology care.  To afford each patient quality time with our provider, please arrive at least 15 minutes before your scheduled appointment time.   If you have a lab appointment with the [place] please come in thru the Main Entrance and check in at the main information desk  You need to re-schedule your appointment should you arrive 10 or more minutes late.  We strive to give you quality time with our providers, and arriving late affects you and other patients whose appointments are after yours.  Also, if you no show three or more times for appointments you may be dismissed from the clinic at the providers discretion.     Again, thank you for choosing [facility].  Our hope is that these requests will decrease the amount of time that you wait before being seen by our physicians.       _____________________________________________________________  Should you have questions after your visit to [facility], please contact our office at [phone] between the hours of 8:00 a.m. and 4:30 p.m.  Voicemails left after 4:00 p.m. will not be returned until the following business day.  For prescription refill requests, have your pharmacy contact our office and allow 72 hours.    Cancer Center Support Programs:   > Cancer Support Group  2nd Tuesday of the month 1pm-2pm, Journey Room

## 2021-01-15 ENCOUNTER — Other Ambulatory Visit: Payer: Self-pay

## 2021-01-15 ENCOUNTER — Ambulatory Visit: Payer: Medicare HMO | Admitting: Family Medicine

## 2021-01-15 ENCOUNTER — Encounter: Payer: Self-pay | Admitting: Family Medicine

## 2021-01-15 VITALS — BP 132/78 | HR 61 | Temp 97.3°F | Ht 64.0 in | Wt 205.0 lb

## 2021-01-15 DIAGNOSIS — I10 Essential (primary) hypertension: Secondary | ICD-10-CM | POA: Diagnosis not present

## 2021-01-15 NOTE — Progress Notes (Signed)
   Subjective:    Patient ID: Bradley [name], male    DOB: 10/26/1945, 75 y.o.   MRN: [id number]  Hypertension This is a chronic problem. Treatments tried: lisinopril prn.  Patient presents to follow-up regarding blood pressure and also follow-up regarding his retroperitoneal fibrosis Soft oncology and they did a good job of looking into this and will do another scan in approximately 2 months and he will do a follow-up with them he is trying to watch how he eats to some degree and staying active   Review of Systems     Objective:   Physical Exam   General-in no acute distress Eyes-no discharge Lungs-respiratory rate normal, CTA CV-no murmurs,RRR Extremities skin warm dry no edema Neuro grossly normal Behavior normal, alert      Assessment & Plan:  HTN good control continue current measures follow-up again in 6 months follow-up sooner problems  Patient will do follow-up with oncology in a couple months with follow-up scan    Under a lot of stress with his brother who fractured his hip plus also has early dementia placing some stressor on Bradley [name] and his wife

## 2021-01-15 NOTE — Patient Instructions (Signed)
https://www.nhlbi.nih.gov/files/docs/public/heart/dash_brief.pdf">  DASH Eating Plan DASH stands for Dietary Approaches to Stop Hypertension. The DASH eating plan is a healthy eating plan that has been shown to: Reduce high blood pressure (hypertension). Reduce your risk for type 2 diabetes, heart disease, and stroke. Help with weight loss. What are tips for following this plan? Reading food labels Check food labels for the amount of salt (sodium) per serving. Choose foods with less than 5 percent of the Daily Value of sodium. Generally, foods with less than 300 milligrams (mg) of sodium per serving fit into this eating plan. To find whole grains, look for the word "whole" as the first word in the ingredient list. Shopping Buy products labeled as "low-sodium" or "no salt added." Buy fresh foods. Avoid canned foods and pre-made or frozen meals. Cooking Avoid adding salt when cooking. Use salt-free seasonings or herbs instead of table salt or sea salt. Check with your health care provider or pharmacist before using salt substitutes. Do not fry foods. Cook foods using healthy methods such as baking, boiling, grilling, roasting, and broiling instead. Cook with heart-healthy oils, such as olive, canola, avocado, soybean, or sunflower oil. Meal planning  Eat a balanced diet that includes: 4 or more servings of fruits and 4 or more servings of vegetables each day. Try to fill one-half of your plate with fruits and vegetables. 6-8 servings of whole grains each day. Less than 6 oz (170 g) of lean meat, poultry, or fish each day. A 3-oz (85-g) serving of meat is about the same size as a deck of cards. One egg equals 1 oz (28 g). 2-3 servings of low-fat dairy each day. One serving is 1 cup (237 mL). 1 serving of nuts, seeds, or beans 5 times each week. 2-3 servings of heart-healthy fats. Healthy fats called omega-3 fatty acids are found in foods such as walnuts, flaxseeds, fortified milks, and eggs.  These fats are also found in cold-water fish, such as sardines, salmon, and mackerel. Limit how much you eat of: Canned or prepackaged foods. Food that is high in trans fat, such as some fried foods. Food that is high in saturated fat, such as fatty meat. Desserts and other sweets, sugary drinks, and other foods with added sugar. Full-fat dairy products. Do not salt foods before eating. Do not eat more than 4 egg yolks a week. Try to eat at least 2 vegetarian meals a week. Eat more home-cooked food and less restaurant, buffet, and fast food.  Lifestyle When eating at a restaurant, ask that your food be prepared with less salt or no salt, if possible. If you drink alcohol: Limit how much you use to: 0-1 drink a day for women who are not pregnant. 0-2 drinks a day for men. Be aware of how much alcohol is in your drink. In the [place] one drink equals one 12 oz bottle of beer (355 mL), one 5 oz glass of wine (148 mL), or one 1 oz glass of hard liquor (44 mL). General information Avoid eating more than 2,300 mg of salt a day. If you have hypertension, you may need to reduce your sodium intake to 1,500 mg a day. Work with your health care provider to maintain a healthy body weight or to lose weight. Ask what an ideal weight is for you. Get at least 30 minutes of exercise that causes your heart to beat faster (aerobic exercise) most days of the week. Activities may include walking, swimming, or biking. Work with your health care provider   or dietitian to adjust your eating plan to your individual calorie needs. What foods should I eat? Fruits All fresh, dried, or frozen fruit. Canned fruit in natural juice (without addedsugar). Vegetables Fresh or frozen vegetables (raw, steamed, roasted, or grilled). Low-sodium or reduced-sodium tomato and vegetable juice. Low-sodium or reduced-sodium tomatosauce and tomato paste. Low-sodium or reduced-sodium canned vegetables. Grains Whole-grain or  whole-wheat bread. Whole-grain or whole-wheat pasta. Brown rice. Oatmeal. Quinoa. Bulgur. Whole-grain and low-sodium cereals. Pita bread.Low-fat, low-sodium crackers. Whole-wheat flour tortillas. Meats and other proteins Skinless chicken or turkey. Ground chicken or turkey. Pork with fat trimmed off. Fish and seafood. Egg whites. Dried beans, peas, or lentils. Unsalted nuts, nut butters, and seeds. Unsalted canned beans. Lean cuts of beef with fat trimmed off. Low-sodium, lean precooked or cured meat, such as sausages or meatloaves. Dairy Low-fat (1%) or fat-free (skim) milk. Reduced-fat, low-fat, or fat-free cheeses. Nonfat, low-sodium ricotta or cottage cheese. Low-fat or nonfatyogurt. Low-fat, low-sodium cheese. Fats and oils Soft margarine without trans fats. Vegetable oil. Reduced-fat, low-fat, or light mayonnaise and salad dressings (reduced-sodium). Canola, safflower, olive, avocado, soybean, andsunflower oils. Avocado. Seasonings and condiments Herbs. Spices. Seasoning mixes without salt. Other foods Unsalted popcorn and pretzels. Fat-free sweets. The items listed above may not be a complete list of foods and beverages you can eat. Contact a dietitian for more information. What foods should I avoid? Fruits Canned fruit in a light or heavy syrup. Fried fruit. Fruit in cream or buttersauce. Vegetables Creamed or fried vegetables. Vegetables in a cheese sauce. Regular canned vegetables (not low-sodium or reduced-sodium). Regular canned tomato sauce and paste (not low-sodium or reduced-sodium). Regular tomato and vegetable juice(not low-sodium or reduced-sodium). Pickles. Olives. Grains Baked goods made with fat, such as croissants, muffins, or some breads. Drypasta or rice meal packs. Meats and other proteins Fatty cuts of meat. Ribs. Fried meat. Bacon. Bologna, salami, and other precooked or cured meats, such as sausages or meat loaves. Fat from the back of a pig (fatback). Bratwurst.  Salted nuts and seeds. Canned beans with added salt. Canned orsmoked fish. Whole eggs or egg yolks. Chicken or turkey with skin. Dairy Whole or 2% milk, cream, and half-and-half. Whole or full-fat cream cheese. Whole-fat or sweetened yogurt. Full-fat cheese. Nondairy creamers. Whippedtoppings. Processed cheese and cheese spreads. Fats and oils Butter. Stick margarine. Lard. Shortening. Ghee. Bacon fat. Tropical oils, suchas coconut, palm kernel, or palm oil. Seasonings and condiments Onion salt, garlic salt, seasoned salt, table salt, and sea salt. Worcestershire sauce. Tartar sauce. Barbecue sauce. Teriyaki sauce. Soy sauce, including reduced-sodium. Steak sauce. Canned and packaged gravies. Fish sauce. Oyster sauce. Cocktail sauce. Store-bought horseradish. Ketchup. Mustard. Meat flavorings and tenderizers. Bouillon cubes. Hot sauces. Pre-made or packaged marinades. Pre-made or packaged taco seasonings. Relishes. Regular saladdressings. Other foods Salted popcorn and pretzels. The items listed above may not be a complete list of foods and beverages you should avoid. Contact a dietitian for more information. Where to find more information National Heart, Lung, and Blood Institute: www.nhlbi.nih.gov American Heart Association: www.heart.org Academy of Nutrition and Dietetics: www.eatright.org National Kidney Foundation: www.kidney.org Summary The DASH eating plan is a healthy eating plan that has been shown to reduce high blood pressure (hypertension). It may also reduce your risk for type 2 diabetes, heart disease, and stroke. When on the DASH eating plan, aim to eat more fresh fruits and vegetables, whole grains, lean proteins, low-fat dairy, and heart-healthy fats. With the DASH eating plan, you should limit salt (sodium) intake to 2,300   mg a day. If you have hypertension, you may need to reduce your sodium intake to 1,500 mg a day. Work with your health care provider or dietitian to adjust  your eating plan to your individual calorie needs. This information is not intended to replace advice given to you by your health care provider. Make sure you discuss any questions you have with your healthcare provider. Document Revised: 06/08/2019 Document Reviewed: 06/08/2019 Elsevier Patient Education  2022 Elsevier Inc.  

## 2021-03-12 ENCOUNTER — Other Ambulatory Visit: Payer: Self-pay

## 2021-03-12 ENCOUNTER — Ambulatory Visit (HOSPITAL_COMMUNITY)
Admission: RE | Admit: 2021-03-12 | Discharge: 2021-03-12 | Disposition: A | Discharge status: Home / Self Care | Payer: Medicare HMO | Source: Ambulatory Visit | Attending: Hematology | Admitting: Hematology

## 2021-03-12 ENCOUNTER — Inpatient Hospital Stay (HOSPITAL_COMMUNITY): Payer: Medicare HMO | Attending: Hematology

## 2021-03-12 DIAGNOSIS — N135 Crossing vessel and stricture of ureter without hydronephrosis: Secondary | ICD-10-CM | POA: Insufficient documentation

## 2021-03-12 LAB — COMPREHENSIVE METABOLIC PANEL
ALT: 21 U/L (ref 0–44)
AST: 27 U/L (ref 15–41)
Albumin: 3.5 g/dL (ref 3.5–5.0)
Alkaline Phosphatase: 113 U/L (ref 38–126)
Anion gap: 8 (ref 5–15)
BUN: 15 mg/dL (ref 8–23)
CO2: 27 mmol/L (ref 22–32)
Calcium: 8.8 mg/dL — ABNORMAL LOW (ref 8.9–10.3)
Chloride: 104 mmol/L (ref 98–111)
Creatinine, Ser: 0.97 mg/dL (ref 0.61–1.24)
GFR, Estimated: >60 mL/min (ref >60)
Glucose, Bld: 108 mg/dL — ABNORMAL HIGH (ref 70–99)
Potassium: 4.1 mmol/L (ref 3.5–5.1)
Sodium: 139 mmol/L (ref 135–145)
Total Bilirubin: 0.6 mg/dL (ref 0.3–1.2)
Total Protein: 6.4 g/dL — ABNORMAL LOW (ref 6.5–8.1)

## 2021-03-12 LAB — CBC WITH DIFFERENTIAL/PLATELET
Abs Immature Granulocytes: 0.04 10*3/uL (ref 0.00–0.07)
Basophils Absolute: 0 10*3/uL (ref 0.0–0.1)
Basophils Relative: 0 %
Eosinophils Absolute: 0.1 10*3/uL (ref 0.0–0.5)
Eosinophils Relative: 2 %
HCT: 42.6 % (ref 39.0–52.0)
Hemoglobin: 14 g/dL (ref 13.0–17.0)
Immature Granulocytes: 1 %
Lymphocytes Relative: 25 %
Lymphs Abs: 1.8 10*3/uL (ref 0.7–4.0)
MCH: 30.4 pg (ref 26.0–34.0)
MCHC: 32.9 g/dL (ref 30.0–36.0)
MCV: 92.4 fL (ref 80.0–100.0)
Monocytes Absolute: 0.5 10*3/uL (ref 0.1–1.0)
Monocytes Relative: 7 %
Neutro Abs: 4.6 10*3/uL (ref 1.7–7.7)
Neutrophils Relative %: 65 %
Platelets: 238 10*3/uL (ref 150–400)
RBC: 4.61 MIL/uL (ref 4.22–5.81)
RDW: 13.2 % (ref 11.5–15.5)
WBC: 7.1 10*3/uL (ref 4.0–10.5)
nRBC: 0 % (ref 0.0–0.2)

## 2021-03-12 LAB — LACTATE DEHYDROGENASE: LDH: 179 U/L (ref 98–192)

## 2021-03-12 MED ORDER — IOHEXOL 350 MG/ML SOLN
75.0000 mL | Freq: Once | INTRAVENOUS | Status: AC | PRN
  Administered 2021-03-12: 75 mL via INTRAVENOUS

## 2021-03-18 NOTE — Progress Notes (Signed)
[place], [place] [address]   CLINIC:  Medical Oncology/Hematology  PCP:  Bradley Drown, MD [address] [place] / [place] [address] [phone]   REASON FOR VISIT:  Follow-up for retroperitoneal fibrosis  PRIOR THERAPY: none  NGS Results: not done  CURRENT THERAPY: surveillance  INTERVAL HISTORY:  [name], a 75 y.o. 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 Bradley [name] was last seen on 11/10/2020.   Today he reports feeling good. He denies back pain, fevers, night sweats, and weight loss.   REVIEW OF SYSTEMS:  Review of Systems  Constitutional:  Negative for appetite change, fatigue (75%), fever and unexpected weight change.  Musculoskeletal:  Negative for back pain.  Neurological:  Positive for dizziness.  All other systems reviewed and are negative.  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 Tubular adenoma   COLONOSCOPY N/A 08/02/2018   Procedure: COLONOSCOPY;  Surgeon: Daneil Dolin, MD;  [facility];  Service: Endoscopy;  Laterality: N/A;  1:15pm   COLONOSCOPY WITH ESOPHAGOGASTRODUODENOSCOPY (EGD) N/A 03/22/2013   Dr. Gala Romney: Barrett's esophagus status post segmental biopsies with no  evidence of dysplasia.  Normal colonoscopy.  Next colonoscopy in 5 years.   ESOPHAGOGASTRODUODENOSCOPY  07/01/2003   ZCH:YIFOYD colored epithelium involving essentially nearly the distal onehalf of the tubular esophagus/Patulous esophagogastric junction/ Moderate sized hiatal hernia/Otherwise normal stomach, normal D1 and D2.   ESOPHAGOGASTRODUODENOSCOPY  12/09/2004   XAJ:OINOMV-EHMCNOB epithelium  beginning at 33 cm consistent with previous diagnosis of Barrett's esophagus  segmentally biopsied. Hiatal hernia. Otherwise normal stomach. Normal D1 and D2   ESOPHAGOGASTRODUODENOSCOPY   05/20/2008   SJG:GEZMOQ-HUTMLYY epithelium distal esophagus consistent with Barrett's esophagus.  No significant change from prior exam. Multiple biopsies negative for dysplasia.   ESOPHAGOGASTRODUODENOSCOPY N/A 08/02/2018   Procedure: ESOPHAGOGASTRODUODENOSCOPY (EGD);  Surgeon: Daneil Dolin, MD;  [facility];  Service: Endoscopy;  Laterality: N/A;   POLYPECTOMY  08/02/2018   Procedure: POLYPECTOMY;  Surgeon: Daneil Dolin, MD;  [facility];  Service: Endoscopy;;  distal transverse colon(CSx1), rectal(CSx1)   TONSILLECTOMY      SOCIAL HISTORY:  Social History   Socioeconomic History   Marital status: Married    Spouse name: Merchandiser, retail   Number of children: 2   Years of education: 14   Highest education level: Not on file  Occupational History   Occupation: Retired    Comment: Hotel manager  Tobacco Use   Smoking status: Never   Smokeless tobacco: Never  Substance and Sexual Activity   Alcohol use: No   Drug use: No   Sexual activity:  Not on file  Other Topics Concern   Not on file  Social History Narrative   Lives with wife   2 children, 1 passed away   caffeine- coffee 1-2 daily   Social Determinants of Health   Financial Resource Strain: Low Risk    Difficulty of Paying Living Expenses: Not very hard  Food Insecurity: No Food Insecurity   Worried About Paediatric nurse in the Last Year: Never true   Ran Out of Food in the Last Year: Never true  Transportation Needs: No Transportation Needs   Lack of Transportation (Medical): No   Lack of Transportation (Non-Medical): No  Physical Activity: Sufficiently Active   Days of Exercise per Week: 4 days   Minutes of Exercise per Session: 40 min  Stress: No Stress Concern Present   Feeling of Stress : Not at all  Social Connections: Socially Integrated   Frequency of Communication with Friends and Family: More than three times a week   Frequency of Social Gatherings with Friends and Family: More than three times a week   Attends Religious Services: More than 4 times per year   Active Member of [employer] or Organizations: Yes   Attends Archivist Meetings: Never   Marital Status: Married  Human resources officer Violence: Not At Risk   Fear of Current or Ex-Partner: No   Emotionally Abused: No   Physically Abused: No   Sexually Abused: No    FAMILY HISTORY:  Family History  Problem Relation Age of Onset   Heart attack Father 18       First MI in 22s   Cancer Brother        prostate   Colon cancer Mother        age greater than 68s   Cancer Sister        brain   COPD Brother     CURRENT MEDICATIONS:  Current Outpatient Medications  Medication Sig Dispense Refill   allopurinol (ZYLOPRIM) 100 MG tablet Take 100 mg by mouth daily after breakfast.      ALPRAZolam (XANAX) 1 MG tablet Take 1 mg by mouth 2 (two) times daily.     aspirin EC 81 MG tablet Take 81 mg by mouth at bedtime.      atorvastatin (LIPITOR) 40 MG tablet Take 40 mg by mouth every evening.      Cyanocobalamin (B-12 IJ) Inject 1 mL as directed every 30 (thirty) days.      diphenhydrAMINE (BENADRYL) 25 MG tablet Take 25 mg by mouth every 6 (six) hours as needed for allergies or sleep.      furosemide (LASIX) 20 MG tablet Take 20 mg by mouth daily as needed for fluid or edema.     gabapentin (NEURONTIN) 300 MG capsule Take 3  capsules (900 mg total) by mouth 2 (two) times daily. 180 capsule 5   lisinopril (ZESTRIL) 40 MG tablet Take 40 mg by mouth daily.     naproxen sodium (ANAPROX) 275 MG tablet Take 275 mg by mouth 2 (two) times daily with a meal.     omeprazole (PRILOSEC) 20 MG capsule Take 20 mg by mouth daily after breakfast.      PRESCRIPTION MEDICATION carbemazepine     pyridostigmine (MESTINON) 60 MG tablet Take 1 tablet (60 mg total) by mouth 2 (two) times daily. 270 tablet 4   sildenafil (VIAGRA) 100 MG tablet Take 100 mg by mouth daily as needed for erectile dysfunction.     SODIUM  FLUORIDE 5000 PPM 1.1 % PSTE Take by mouth.     tamsulosin (FLOMAX) 0.4 MG CAPS capsule 2 qhs for BPH 180 capsule 1   Vitamin D, Cholecalciferol, 25 MCG (1000 UT) CAPS Take by mouth.     No current facility-administered medications for this visit.    ALLERGIES:  No Known Allergies  PHYSICAL EXAM:  Performance status (ECOG): 1 - Symptomatic but completely ambulatory  There were no vitals filed for this visit. Wt Readings from Last 3 Encounters:  01/15/21 205 lb (93 kg)  11/10/20 205 lb 2 oz (93 kg)  10/22/20 204 lb 14.4 oz (92.9 kg)   Physical Exam   LABORATORY DATA:  I have reviewed the labs as listed.  CBC Latest Ref Rng & Units 03/12/2021 10/13/2020 09/03/2020  WBC 4.0 - 10.5 K/uL 7.1 6.2 17.7(H)  Hemoglobin 13.0 - 17.0 g/dL 14.0 15.3 15.7  Hematocrit 39.0 - 52.0 % 42.6 45.1 45.1  Platelets 150 - 400 K/uL 238 198 188   CMP Latest Ref Rng & Units 03/12/2021 10/15/2020 10/13/2020  Glucose 70 - 99 mg/dL 108(H) - 89  BUN 8 - 23 mg/dL 15 - 14  Creatinine 0.61 - 1.24 mg/dL 0.97 - 1.23  Sodium 135 - 145 mmol/L 139 - 138  Potassium 3.5 - 5.1 mmol/L 4.1 - 4.7  Chloride 98 - 111 mmol/L 104 - 102  CO2 22 - 32 mmol/L 27 - 22  Calcium 8.9 - 10.3 mg/dL 8.8(L) - 9.3  Total Protein 6.5 - 8.1 g/dL 6.4(L) 6.7 6.6  Total Bilirubin 0.3 - 1.2 mg/dL 0.6 - 0.4  Alkaline Phos 38 - 126 U/L 113 - 105  AST 15 - 41 U/L 27 - 32  ALT  0 - 44 U/L 21 - 32    DIAGNOSTIC IMAGING:  I have independently reviewed the scans and discussed with the patient. CT Abdomen Pelvis W Contrast  Result Date: 03/13/2021 CLINICAL DATA:  History of retroperitoneal fibrosis and Barrett's esophagus. EXAM: CT ABDOMEN AND PELVIS WITH CONTRAST TECHNIQUE: Multidetector CT imaging of the abdomen and pelvis was performed using the standard protocol following bolus administration of intravenous contrast. CONTRAST:  43m OMNIPAQUE IOHEXOL 350 MG/ML SOLN COMPARISON:  Abdominopelvic CT 09/03/2020 FINDINGS: Lower chest: Trace bilateral pleural effusions and mild atherosclerosis of the aorta and coronary arteries. The lung bases are clear. Hepatobiliary: The liver is normal in density without suspicious focal abnormality. No evidence of gallstones, gallbladder wall thickening or biliary dilatation. Pancreas: Unremarkable. No pancreatic ductal dilatation or surrounding inflammatory changes. Spleen: Normal in size without focal abnormality. Adrenals/Urinary Tract: Both adrenal glands appear normal. Stable nonobstructing calculus and 5.8 cm cyst in the lower pole of the left kidney. 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 No evidence of renal mass or hydronephrosis. The bladder appears unremarkable for its degree of distention. Stomach/Bowel: No enteric contrast administered. The stomach appears unremarkable for its degree of distension. No evidence of bowel wall thickening, distention or surrounding inflammatory change. The appendix appears normal. Mild sigmoid colon diverticulosis. Vascular/Lymphatic: There are no enlarged abdominal or pelvic lymph nodes. There are stable small retroperitoneal and inguinal lymph nodes bilaterally. There is stable retroperitoneal soft tissue stranding surrounding the abdominal aorta, its proximal branches and the iliac arteries. There is diffuse underlying aortic and branch vessel  atherosclerosis. No evidence of large vessel occlusion or aneurysm. The portal, superior mesenteric and splenic veins are patent. Reproductive: The prostate gland remains heterogeneous and mildly enlarged. There is a  small hypervascular nodule on the right, measuring 2.3 cm on image 85/2, nonspecific. Other: No evidence of abdominal wall mass or hernia. No ascites. Musculoskeletal: No acute or significant osseous findings. Stable mild lumbar spondylosis. IMPRESSION: 1. No significant change in retroperitoneal soft tissue stranding consistent with fibrosis. No discrete adenopathy, fluid collection, aneurysm or large vessel occlusion. No evidence of ureteral obstruction. 2. Stable nonobstructing left renal calculus and left renal cyst. 3. Heterogeneous prostate gland with nonspecific nodularity on the right, similar to previous study. Correlate with PSA levels. 4. Mild distal colonic diverticulosis. 5.  Aortic Atherosclerosis (ICD10-I70.0). Electronically Signed   By: Richardean Sale M.D.   On: 03/13/2021 09:59     ASSESSMENT:  1.  Retroperitoneal fibrosis: -He was evaluated for UTI in the ER. -CT renal stone study on 09/03/2020 showed abnormal soft tissue surrounding the infrarenal abdominal aorta and along both common iliac arteries, right greater than left.  5 mm nonobstructing stone in the lower pole of the left kidney.  5.9 cm lower pole left renal cyst. -CTAP with contrast on 09/03/2020 shows abnormal soft tissue surrounding the aorta extending from the level of the renal arteries to the iliac bifurcation.  This extends to involve the proximal common iliac arteries. -Denied any fevers, night sweats.  35 pound weight loss/6 to 8 months, intentional.  He quit drinking sodas. -No personal or family history of connective tissue disorders. - SPEP on 10/15/2020 was normal.  Free light chain ratio normal.  PSA 2.8.  Immunofixation was normal. - Labs on 10/22/2020-LDH normal.  CRP and ESR normal.  TSH and  thyroglobulin antibodies were negative.  ANA and rheumatoid factor was negative.  CEA was 1.2.  Immunoglobulin levels and IgG subclass 4 were normal.   2.  Social/family history: -He worked in the [employer].  No exposure to chemicals.  No history of smoking. -Brother had metastatic prostate cancer.  Mother had stomach cancer.  Maternal cousin had esophageal cancer   PLAN:  1.  Retroperitoneal fibrosis: - He does not report any B symptoms or infections. - Reviewed labs from 03/12/2021 which showed normal LDH, renal function and LFTs.  CBC was grossly normal. - Reviewed images of the CT [facility] from 03/12/2021 which showed retroperitoneal soft tissue stranding consistent with fibrosis with no discrete adenopathy, fluid collection, aneurysm or large vessel occlusion.  No evidence of ureteral obstruction.  Stable left renal calculus.  Heterogeneous prostate gland.  Overall stable fibrosis from 09/03/2020. - PSA is 2.8. - Recommend follow-up in 1 year with repeat blood work and CT scan.  If there is no progression, will do imaging only as needed and to follow-up once a year with labs and physical exam.    Orders placed this encounter:  No orders of the defined types were placed in this encounter.    Derek Jack, MD [place] [phone]   I, Thana Ates, am acting as a scribe for Dr. Derek Jack.  I, Derek Jack MD, have reviewed the above documentation for accuracy and completeness, and I agree with the above.

## 2021-03-19 ENCOUNTER — Other Ambulatory Visit: Payer: Self-pay

## 2021-03-19 ENCOUNTER — Inpatient Hospital Stay (HOSPITAL_COMMUNITY): Payer: Medicare HMO | Attending: Hematology | Admitting: Hematology

## 2021-03-19 VITALS — BP 128/76 | HR 74 | Temp 96.7°F | Resp 18 | Wt 205.9 lb

## 2021-03-19 DIAGNOSIS — R42 Dizziness and giddiness: Secondary | ICD-10-CM | POA: Diagnosis not present

## 2021-03-19 DIAGNOSIS — Z8042 Family history of malignant neoplasm of prostate: Secondary | ICD-10-CM | POA: Diagnosis not present

## 2021-03-19 DIAGNOSIS — Z809 Family history of malignant neoplasm, unspecified: Secondary | ICD-10-CM | POA: Insufficient documentation

## 2021-03-19 DIAGNOSIS — N135 Crossing vessel and stricture of ureter without hydronephrosis: Secondary | ICD-10-CM | POA: Diagnosis not present

## 2021-03-19 DIAGNOSIS — Z800 Family history of malignant neoplasm of digestive organs: Secondary | ICD-10-CM | POA: Insufficient documentation

## 2021-03-19 NOTE — Patient Instructions (Addendum)
[place] at [facility] Discharge Instructions  You were seen today by Dr. Delton Coombes. He went over your recent results and scans. You will be scheduled for a CT scan of your abdomen and pelvis prior to your next visit. Dr. Delton Coombes will see you back in 1 year for labs and follow up.   Thank you for choosing [place] at [facility] to provide your oncology and hematology care.  To afford each patient quality time with our provider, please arrive at least 15 minutes before your scheduled appointment time.   If you have a lab appointment with the [facility] please come in thru the Main Entrance and check in at the main information desk  You need to re-schedule your appointment should you arrive 10 or more minutes late.  We strive to give you quality time with our providers, and arriving late affects you and other patients whose appointments are after yours.  Also, if you no show three or more times for appointments you may be dismissed from the clinic at the providers discretion.     Again, thank you for choosing [facility].  Our hope is that these requests will decrease the amount of time that you wait before being seen by our physicians.       _____________________________________________________________  Should you have questions after your visit to [facility], please contact our office at [phone] between the hours of 8:00 a.m. and 4:30 p.m.  Voicemails left after 4:00 p.m. will not be returned until the following business day.  For prescription refill requests, have your pharmacy contact our office and allow 72 hours.    Cancer Center Support Programs:   > Cancer Support Group  2nd Tuesday of the month 1pm-2pm, Journey Room

## 2021-06-19 ENCOUNTER — Ambulatory Visit (INDEPENDENT_AMBULATORY_CARE_PROVIDER_SITE_OTHER): Payer: Medicare HMO | Admitting: Nurse Practitioner

## 2021-06-19 ENCOUNTER — Other Ambulatory Visit: Payer: Self-pay

## 2021-06-19 ENCOUNTER — Encounter: Payer: Self-pay | Admitting: Nurse Practitioner

## 2021-06-19 VITALS — BP 124/82 | Temp 98.4°F | Wt 207.8 lb

## 2021-06-19 DIAGNOSIS — N401 Enlarged prostate with lower urinary tract symptoms: Secondary | ICD-10-CM | POA: Diagnosis not present

## 2021-06-19 DIAGNOSIS — K5900 Constipation, unspecified: Secondary | ICD-10-CM

## 2021-06-19 DIAGNOSIS — R350 Frequency of micturition: Secondary | ICD-10-CM | POA: Diagnosis not present

## 2021-06-19 LAB — POCT URINALYSIS DIPSTICK
Spec Grav, UA: >=1.03 — AB (ref 1.010–1.025)
pH, UA: 6 (ref 5.0–8.0)

## 2021-06-19 MED ORDER — SULFAMETHOXAZOLE-TRIMETHOPRIM 800-160 MG PO TABS: 1.0000 | ORAL_TABLET | Freq: Two times a day (BID) | ORAL | 0 refills | Status: DC

## 2021-06-19 NOTE — Progress Notes (Deleted)
   Subjective:    Patient ID: Bradley [name], male    DOB: Nov 04, 1945, 74 y.o.   MRN: [id number]  Back Pain Associated symptoms include abdominal pain. Pertinent negatives include no fever.  Patient presents with pain located under abdominal and trouble starting stream. He feels discomfort and pressure in lower abdomen. He is constipated and has not been to bathroom in three days which is unusual for him. Not currently in pain and pain does come and go. Denies fever, chills, chest pain, SOB, N/V. He has history of BPH and take flomax which works well.    Review of Systems  Constitutional:  Negative for chills and fever.  Respiratory:  Negative for cough, shortness of breath and wheezing.   Gastrointestinal:  Positive for abdominal distention, abdominal pain and constipation. Negative for diarrhea, nausea and vomiting.  Genitourinary:  Positive for difficulty urinating.  Neurological:  Negative for dizziness and light-headedness.      Objective:   Physical Exam Vitals reviewed.  Cardiovascular:     Rate and Rhythm: Normal rate and regular rhythm.     Heart sounds: Normal heart sounds.  Pulmonary:     Effort: Pulmonary effort is normal.     Breath sounds: Normal breath sounds.  Abdominal:     General: Bowel sounds are normal.     Tenderness: There is abdominal tenderness.  Neurological:     Mental Status: He is alert and oriented to person, place, and time.  Psychiatric:        Mood and Affect: Mood normal.        Behavior: Behavior normal.        Thought Content: Thought content normal.        Judgment: Judgment normal.    .. Vitals:   06/19/21 1325  BP: 124/82  Temp: 98.4 F (36.9 C)  Weight: 207 lb 12.8 oz (94.3 kg)         Assessment & Plan:   .Marland Kitchen Discussed different constipation relief medicines with patient.  Meds ordered this encounter  Medications   sulfamethoxazole-trimethoprim (BACTRIM DS) 800-160 MG tablet    Sig: Take 1 tablet by mouth 2 (two) times  daily.    Dispense:  42 tablet    Refill:  0    Order Specific Question:   Supervising Provider    Answer:   Sallee Lange A [[id number]]

## 2021-06-19 NOTE — Progress Notes (Incomplete)
° °  Subjective:    Patient ID: Bradley [name], male    DOB: 12/23/1945, 75 y.o.   MRN: [id number]  HPI Patient presents with abdominal pain    Review of Systems     Objective:   Physical Exam        Assessment & Plan:

## 2021-06-19 NOTE — Patient Instructions (Signed)
   Constipation, Adult Constipation is when a person has fewer than three bowel movements in a week, has difficulty having a bowel movement, or has stools (feces) that are dry, hard, or larger than normal. Constipation may be caused by an underlying condition. It may become worse with age if a person takes certain medicines and does not take in enough fluids. Follow these instructions at home: Eating and drinking  Eat foods that have a lot of fiber, such as beans, whole grains, and fresh fruits and vegetables. Limit foods that are low in fiber and high in fat and processed sugars, such as fried or sweet foods. These include french fries, hamburgers, cookies, candies, and soda. Drink enough fluid to keep your urine pale yellow. General instructions Exercise regularly or as told by your health care provider. Try to do 150 minutes of moderate exercise each week. Use the bathroom when you have the urge to go. Do not hold it in. Take over-the-counter and prescription medicines only as told by your health care provider. This includes any fiber supplements. During bowel movements: Practice deep breathing while relaxing the lower abdomen. Practice pelvic floor relaxation. Watch your condition for any changes. Let your health care provider know about them. Keep all follow-up visits as told by your health care provider. This is important. Contact a health care provider if: You have pain that gets worse. You have a fever. You do not have a bowel movement after 4 days. You vomit. You are not hungry or you lose weight. You are bleeding from the opening between the buttocks (anus). You have thin, pencil-like stools. Get help right away if: You have a fever and your symptoms suddenly get worse. You leak stool or have blood in your stool. Your abdomen is bloated. You have severe pain in your abdomen. You feel dizzy or you faint. Summary Constipation is when a person has fewer than three bowel  movements in a week, has difficulty having a bowel movement, or has stools (feces) that are dry, hard, or larger than normal. Eat foods that have a lot of fiber, such as beans, whole grains, and fresh fruits and vegetables. Drink enough fluid to keep your urine pale yellow. Take over-the-counter and prescription medicines only as told by your health care provider. This includes any fiber supplements. This information is not intended to replace advice given to you by your health care provider. Make sure you discuss any questions you have with your health care provider. Document Revised: 05/23/2019 Document Reviewed: 05/23/2019 Elsevier Patient Education  [place].  One dose of Miralax with 8 oz fluids every hour up to 6 doses; repeat next day if needed Dulcolax 3 tabs first night; 3 tabs in am if needed; 2 next night if needed Fleets enema Mag citrate

## 2021-06-19 NOTE — Progress Notes (Signed)
   Subjective:    Patient ID: Bradley [name], male    DOB: 05/26/1946, 75 y.o.   MRN: [id number]  HPI Pt having low back pain and generalized lower abdominal pain. Symptoms began last Monday. Pt states he had the same issue last year and went through 3 rounds of antibiotics. The last antibiotic was for UTI and prostate. Pt has been using heat pad for back pain. Had slight burning and urgency with urination a few days ago but this has improved. Trouble starting stream and straining to empty bladder. No fever. No blood in urine. Taking fluids well.  Pt wife states "he is stopped up". C/o constipation for the past 3 days. Usually goes everyday. No change in diet. Tried OTC laxative with minimal results. No nausea or vomiting.        Objective:   Physical Exam NAD. Alert, oriented. Lungs clear, Heart RRR. No CVA tenderness. Abdomen obese, soft, non distended with active BS x 4. Minimal lower abdominal tenderness to deep palpation along the left lower lateral abdomen. No rebound or guarding. No obvious masses but exam limited due to abd girth.  Results for orders placed or performed in visit on 06/19/21  POCT Urinalysis Dipstick  Result Value Ref Range   Color, UA     Clarity, UA     Glucose, UA     Bilirubin, UA     Ketones, UA     Spec Grav, UA >=1.030 (A) 1.010 - 1.025   Blood, UA     pH, UA 6.0 5.0 - 8.0   Protein, UA     Urobilinogen, UA     Nitrite, UA     Leukocytes, UA     Appearance     Odor     Today's Vitals   06/19/21 1325  BP: 124/82  Temp: 98.4 F (36.9 C)  Weight: 207 lb 12.8 oz (94.3 kg)   Body mass index is 35.67 kg/m.        Assessment & Plan:   Problem List Items Addressed This Visit       Other   Benign prostatic hyperplasia with urinary frequency - Primary   Other Visit Diagnoses     Urine frequency       Relevant Orders   POCT Urinalysis Dipstick (Completed)   Constipation, unspecified constipation type          Meds ordered this  encounter  Medications   sulfamethoxazole-trimethoprim (BACTRIM DS) 800-160 MG tablet    Sig: Take 1 tablet by mouth 2 (two) times daily.    Dispense:  42 tablet    Refill:  0    Order Specific Question:   Supervising Provider    Answer:   Sallee Lange A [[id number]]   Given information on constipation and several OTC regimens to try.  Patient is leaving for vacation tomorrow. Rx for antibiotics sent in for patient to have on hand if his urinary symptoms become worse.  Warning signs reviewed. Patient to see help at ED or urgent care if needed. Return if symptoms worsen or fail to improve, Follow up with Dr. Nicki Reaper as planned.

## 2021-06-20 ENCOUNTER — Encounter: Payer: Self-pay | Admitting: Nurse Practitioner

## 2021-07-09 ENCOUNTER — Ambulatory Visit: Payer: Medicare HMO | Admitting: Family Medicine

## 2021-07-16 ENCOUNTER — Ambulatory Visit: Payer: Medicare HMO | Admitting: Family Medicine

## 2021-07-17 ENCOUNTER — Ambulatory Visit: Payer: Medicare HMO | Admitting: Family Medicine

## 2021-07-22 ENCOUNTER — Ambulatory Visit: Payer: Medicare HMO | Admitting: Family Medicine

## 2021-08-11 ENCOUNTER — Ambulatory Visit (INDEPENDENT_AMBULATORY_CARE_PROVIDER_SITE_OTHER): Payer: Medicare HMO

## 2021-08-11 ENCOUNTER — Ambulatory Visit: Payer: Medicare HMO | Admitting: Family Medicine

## 2021-08-11 ENCOUNTER — Encounter: Payer: Self-pay | Admitting: Internal Medicine

## 2021-08-11 ENCOUNTER — Other Ambulatory Visit: Payer: Self-pay

## 2021-08-11 VITALS — BP 156/78 | HR 72 | Ht 64.0 in | Wt 206.6 lb

## 2021-08-11 DIAGNOSIS — Z0000 Encounter for general adult medical examination without abnormal findings: Secondary | ICD-10-CM | POA: Diagnosis not present

## 2021-08-11 DIAGNOSIS — Z789 Other specified health status: Secondary | ICD-10-CM | POA: Insufficient documentation

## 2021-08-11 DIAGNOSIS — D229 Melanocytic nevi, unspecified: Secondary | ICD-10-CM

## 2021-08-11 DIAGNOSIS — N400 Enlarged prostate without lower urinary tract symptoms: Secondary | ICD-10-CM | POA: Insufficient documentation

## 2021-08-11 DIAGNOSIS — E559 Vitamin D deficiency, unspecified: Secondary | ICD-10-CM | POA: Insufficient documentation

## 2021-08-11 NOTE — Progress Notes (Signed)
Subjective:   JONMARC BODKIN is a 76 y.o. male who presents for Medicare Annual/Subsequent preventive examination. Review of Systems     Cardiac Risk Factors include: advanced age (>34men, >52 women);dyslipidemia;hypertension;male gender;sedentary lifestyle;obesity (BMI >30kg/m2)  IN OFFICE VISIT AT [facility].    Objective:    Today's Vitals   08/11/21 1306 08/11/21 1311  BP: (!) 156/78   Pulse: 72   SpO2: 100%   Weight: 206 lb 9.6 oz (93.7 kg)   Height: 5\' 4"  (1.626 m)   PainSc:  0-No pain   Body mass index is 35.46 kg/m.  Advanced Directives 08/11/2021 03/19/2021 11/10/2020 10/22/2020 09/03/2020 08/02/2018 07/17/2016  Does Patient Have a Medical Advance Directive? No No No No No No No  Would patient like information on creating a medical advance directive? No - Patient declined No - Patient declined No - Patient declined No - Patient declined - Yes (MAU/Ambulatory/Procedural Areas - Information given) -  Pre-existing out of facility DNR order (yellow form or pink MOST form) - - - - - - -    Current Medications (verified) Outpatient Encounter Medications as of 08/11/2021  Medication Sig   allopurinol (ZYLOPRIM) 100 MG tablet Take 100 mg by mouth daily after breakfast.    ALPRAZolam (XANAX) 1 MG tablet Take 1 mg by mouth 2 (two) times daily.   aspirin EC 81 MG tablet Take 81 mg by mouth at bedtime.    atorvastatin (LIPITOR) 40 MG tablet Take 40 mg by mouth every evening.    Cyanocobalamin (B-12 IJ) Inject 1 mL as directed every 30 (thirty) days.    diphenhydrAMINE (BENADRYL) 25 MG tablet Take 25 mg by mouth every 6 (six) hours as needed for allergies or sleep.    furosemide (LASIX) 20 MG tablet Take 20 mg by mouth daily as needed for fluid or edema.   gabapentin (NEURONTIN) 300 MG capsule Take 3 capsules (900 mg total) by mouth 2 (two) times daily.   lisinopril (ZESTRIL) 40 MG tablet Take 40 mg by mouth daily.   naproxen sodium (ANAPROX) 275 MG tablet Take 275 mg by mouth 2 (two) times  daily with a meal.   omeprazole (PRILOSEC) 20 MG capsule Take 20 mg by mouth daily after breakfast.    PRESCRIPTION MEDICATION carbemazepine   pyridostigmine (MESTINON) 60 MG tablet Take 1 tablet (60 mg total) by mouth 2 (two) times daily.   sildenafil (VIAGRA) 100 MG tablet Take 100 mg by mouth daily as needed for erectile dysfunction.   SODIUM FLUORIDE 5000 PPM 1.1 % PSTE Take by mouth.   tamsulosin (FLOMAX) 0.4 MG CAPS capsule 2 qhs for BPH   Vitamin D, Cholecalciferol, 25 MCG (1000 UT) CAPS Take by mouth.   sulfamethoxazole-trimethoprim (BACTRIM DS) 800-160 MG tablet Take 1 tablet by mouth 2 (two) times daily. (Patient not taking: Reported on 08/11/2021)   No facility-administered encounter medications on file as of 08/11/2021.    Allergies (verified) Patient has no known allergies.   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 Tubular adenoma   COLONOSCOPY N/A 08/02/2018   Procedure: COLONOSCOPY;  Surgeon: Daneil Dolin, MD;  [facility];  Service: Endoscopy;  Laterality: N/A;  1:15pm   COLONOSCOPY WITH ESOPHAGOGASTRODUODENOSCOPY (EGD) N/A 03/22/2013   Dr. Gala Romney: Barrett's esophagus status post segmental biopsies with no evidence of dysplasia.  Normal colonoscopy.  Next colonoscopy in 5 years.    ESOPHAGOGASTRODUODENOSCOPY  07/01/2003   PXT:GGYIRS colored epithelium involving essentially nearly the distal onehalf of the tubular esophagus/Patulous esophagogastric junction/ Moderate sized hiatal hernia/Otherwise normal stomach, normal D1 and D2.   ESOPHAGOGASTRODUODENOSCOPY  12/09/2004   WNI:OEVOJJ-[id number] epithelium  beginning at 33 cm consistent with previous diagnosis of Barrett's esophagus  segmentally biopsied. Hiatal hernia. Otherwise normal stomach. Normal D1 and D2   ESOPHAGOGASTRODUODENOSCOPY   05/20/2008   EXH:BZJIRC-[id number] epithelium distal esophagus consistent with Barrett's esophagus.  No significant change from prior exam. Multiple biopsies negative for dysplasia.   ESOPHAGOGASTRODUODENOSCOPY N/A 08/02/2018   Procedure: ESOPHAGOGASTRODUODENOSCOPY (EGD);  Surgeon: Daneil Dolin, MD;  [facility];  Service: Endoscopy;  Laterality: N/A;   POLYPECTOMY  08/02/2018   Procedure: POLYPECTOMY;  Surgeon: Daneil Dolin, MD;  [facility];  Service: Endoscopy;;  distal transverse colon(CSx1), rectal(CSx1)   TONSILLECTOMY     Family History  Problem Relation Age of Onset   Heart attack Father 51       First MI in 24s   Cancer Brother        prostate   Colon cancer Mother        age greater than 51s   Cancer Sister        brain   COPD Brother    Social History   Socioeconomic History   Marital status: Married    Spouse name: Merchandiser, retail   Number of children: 2   Years of education: 14   Highest education level: Not on file  Occupational History   Occupation: Retired    Comment: Hotel manager  Tobacco Use   Smoking status: Never   Smokeless tobacco: Never  Substance and Sexual Activity   Alcohol use: No   Drug use: No   Sexual activity: Not on file  Other Topics Concern   Not on file  Social History Narrative   Lives with wife   2 children, 1 passed away   caffeine- coffee 1-2 daily   Social Determinants of Health   Financial  Resource Strain: Low Risk    Difficulty of Paying Living Expenses: Not very hard  Food Insecurity: No Food Insecurity   Worried About Charity fundraiser in the Last Year: Never true   Ran Out of Food in the Last Year: Never true  Transportation Needs: No Transportation Needs   Lack of Transportation (Medical): No   Lack of Transportation (Non-Medical): No  Physical Activity: Insufficiently Active   Days of Exercise per Week: 5 days   Minutes of Exercise per Session: 20 min  Stress: No Stress Concern Present   Feeling of Stress : Only a little  Social Connections: Engineer, building services of Communication with Friends and Family: More than three times a week   Frequency  of Social Gatherings with Friends and Family: More than three times a week   Attends Religious Services: More than 4 times per year   Active Member of [employer] or Organizations: Yes   Attends Music therapist: More than 4 times per year   Marital Status: Married    Tobacco Counseling Counseling given: Not Answered   Clinical Intake:  Pre-visit preparation completed: Yes  Pain : No/denies pain Pain Score: 0-No pain     BMI - recorded: 35.46 Nutritional Status: BMI > 30  Obese Nutritional Risks: None Diabetes: No  How often do you need to have someone help you when you read instructions, pamphlets, or other written materials from your doctor or pharmacy?: 1 - Never  Diabetic?NO  Interpreter Needed?: No  Information entered by :: MJ Maycee Blasco,LPN   Activities of Daily Living In your present state of health, do you have any difficulty performing the following activities: 08/11/2021  Hearing? Y  Comment has hearing aids  Vision? N  Difficulty concentrating or making decisions? N  Walking or climbing stairs? N  Dressing or bathing? N  Doing errands, shopping? N  Preparing Food and eating ? N  Using the Toilet? N  In the past six months, have you accidently leaked urine? N  Do you  have problems with loss of bowel control? N  Managing your Medications? N  Managing your Finances? N  Housekeeping or managing your Housekeeping? N  Some recent data might be hidden    Patient Care Team: Kathyrn Drown, MD as PCP - General (Family Medicine) Brien Mates, RN as Oncology Nurse Navigator  Indicate any recent Medical Services you may have received from other than [facility] providers in the past year (date may be approximate).     Assessment:   This is a routine wellness examination for [facility].  Hearing/Vision screen Hearing Screening - Comments:: Some hearing issues, has hearing aid Vision Screening - Comments:: Glasses. [facility] [place] issues and exercise activities discussed: Current Exercise Habits: Home exercise routine, Type of exercise: walking, Time (Minutes): 20, Frequency (Times/Week): 5, Weekly Exercise (Minutes/Week): 100, Intensity: Mild, Exercise limited by: cardiac condition(s);neurologic condition(s)   Goals Addressed             This Visit's Progress    DIET - REDUCE CALORIE INTAKE       Continue to lose weight and stay healthy.       Depression Screen PHQ 2/9 Scores 08/11/2021 10/22/2020 07/24/2020 02/26/2019 02/22/2018 02/22/2018 02/22/2018  PHQ - 2 Score 0 0 0 0 2 2 2   PHQ- 9 Score - - - - 4 4 -    Fall Risk Fall Risk  08/11/2021 03/13/2020 02/26/2019 02/22/2018 02/22/2018  Falls in the past year? 0 0 0 No No  Number falls in past yr: 0 - - - -  Injury with Fall? 0 - - - -  Risk for fall due to : Impaired balance/gait - - - -  Follow up Falls prevention discussed - - - -    FALL RISK PREVENTION PERTAINING TO THE HOME:  Any stairs in or around the home? Yes  If so, are there any without handrails? No  Home free of loose throw rugs in walkways, pet beds, electrical cords, etc? Yes  Adequate lighting in your home to reduce risk of falls? Yes   ASSISTIVE DEVICES UTILIZED TO PREVENT FALLS:  Life alert? No  Use of a cane, walker or  w/c? No  Grab bars in the bathroom? No  Shower chair or bench in shower? Yes  Elevated toilet seat or a handicapped toilet? Yes   TIMED UP AND GO:  Was the test performed? Yes .  Length of time to ambulate 10 feet: 10 sec.   Gait steady and fast without use of assistive device  Cognitive Function: Normal cognitive status assessed by direct observation by this Nurse Health Advisor. No abnormalities found.          Immunizations Immunization History  Administered Date(s) Administered   Influenza Split 04/26/2014, 05/14/2015, 04/21/2016, 05/20/2017   Influenza, High Dose Seasonal PF 05/29/2019, 04/22/2020   Influenza,trivalent, recombinat, inj, PF 04/20/2018   Influenza-Unspecified 08/05/2009, 04/14/2011, 06/09/2012, 04/26/2013, 04/18/2018, 05/02/2020, 04/18/2021   Moderna Sars-Covid-2 Vaccination 09/21/2019, 10/19/2019, 06/23/2020   PFIZER(Purple Top)SARS-COV-2 Vaccination 05/23/2020   Pneumococcal Conjugate-13 01/30/2014, 04/26/2014   Pneumococcal Polysaccharide-23 06/11/2013, 12/11/2014   Tdap 06/12/2012   Varicella 06/11/2013   Zoster Recombinat (Shingrix) 03/28/2019, 05/29/2019    TDAP status: Due, Education has been provided regarding the importance of this vaccine. Advised may receive this vaccine at local pharmacy or Health Dept. Aware to provide a copy of the vaccination record if obtained from local pharmacy or Health Dept. Verbalized acceptance and understanding.  Flu Vaccine status: Up to date  Pneumococcal vaccine status: Up to date  Covid-19 vaccine status: Completed vaccines  Qualifies for Shingles Vaccine? Yes   Zostavax completed Yes   Shingrix Completed?: Yes  Screening Tests Health Maintenance  Topic Date Due   COVID-19 Vaccine (5 - Booster) 08/18/2020   Hepatitis C Screening  06/20/2022 (Originally 06/11/1964)   TETANUS/TDAP  06/12/2022   COLONOSCOPY (Pts 45-31yrs Insurance coverage will need to be confirmed)  08/03/2023   Pneumonia Vaccine 77+  Years old  Completed   Zoster Vaccines- Shingrix  Completed   HPV VACCINES  Aged Out   INFLUENZA VACCINE  Discontinued    Health Maintenance  Health Maintenance Due  Topic Date Due   COVID-19 Vaccine (5 - Booster) 08/18/2020    Colorectal cancer screening: Type of screening: Colonoscopy. Completed 08/02/2018. Repeat every 5 years  Lung Cancer Screening: (Low Dose CT Chest recommended if Age 19-80 years, 30 pack-year currently smoking OR have quit w/in 15years.) does qualify.    Additional Screening:  Hepatitis C Screening: does qualify; Completed due  Vision Screening: Recommended annual ophthalmology exams for early detection of glaucoma and other disorders of the eye. Is the patient up to date with their annual eye exam?  Yes  Who is the provider or what is the name of the office in which the patient attends annual eye exams? [facility] [place] If pt is not established with a provider, would they like to be referred to a provider to establish care? No .   Dental Screening: Recommended annual dental exams for proper oral hygiene  Community Resource Referral / Chronic Care Management: CRR required this visit?  No   CCM required this visit?  No      Plan:     I have personally reviewed and noted the following in the patients chart:   Medical and social history Use of alcohol, tobacco or illicit drugs  Current medications and supplements including opioid prescriptions. Patient is not currently taking opioid prescriptions. Functional ability and status Nutritional status Physical activity Advanced directives List of other physicians Hospitalizations, surgeries, and ER visits in previous 12 months Vitals Screenings to include cognitive, depression, and falls Referrals and appointments  In addition, I have  reviewed and discussed with patient certain preventive protocols, quality metrics, and best practice recommendations. A written personalized care plan for  preventive services as well as general preventive health recommendations were provided to patient.     Chriss Driver, LPN   6/44/0347   Nurse Notes: Pt is up to date on health maintenance and vaccines.

## 2021-08-11 NOTE — Patient Instructions (Signed)
[name] , Thank you for taking time to come for your Medicare Wellness Visit. I appreciate your ongoing commitment to your health goals. Please review the following plan we discussed and let me know if I can assist you in the future.   Screening recommendations/referrals: Colonoscopy: Done 08/02/2018 Repeat in 5 years  Recommended yearly ophthalmology/optometry visit for glaucoma screening and checkup Recommended yearly dental visit for hygiene and checkup  Vaccinations: Influenza vaccine: Done 04/18/2021 Repeat annually  Pneumococcal vaccine: Done 06/11/2013 and 01/30/2014 Tdap vaccine: Due. Repeat in 10 years  Shingles vaccine: Shingrix discussed. Please contact your pharmacy for coverage information.     Covid-19: Done 09/21/2019, 10/19/2019 and 06/23/2020  Advanced directives: Advance directive discussed with you today. I have provided a copy for you to complete at home and have notarized. Once this is complete please bring a copy in to our office so we can scan it into your chart.   Conditions/risks identified: Aim for 30 minutes of exercise or brisk walking each day, drink 6-8 glasses of water and eat lots of fruits and vegetables.   Next appointment: Follow up in one year for your annual wellness visit. 2024.  Preventive Care 37 Years and Older, Male  Preventive care refers to lifestyle choices and visits with your health care provider that can promote health and wellness. What does preventive care include? A yearly physical exam. This is also called an annual well check. Dental exams once or twice a year. Routine eye exams. Ask your health care provider how often you should have your eyes checked. Personal lifestyle choices, including: Daily care of your teeth and gums. Regular physical activity. Eating a healthy diet. Avoiding tobacco and drug use. Limiting alcohol use. Practicing safe sex. Taking low doses of aspirin every day. Taking vitamin and mineral supplements as  recommended by your health care provider. What happens during an annual well check? The services and screenings done by your health care provider during your annual well check will depend on your age, overall health, lifestyle risk factors, and family history of disease. Counseling  Your health care provider may ask you questions about your: Alcohol use. Tobacco use. Drug use. Emotional well-being. Home and relationship well-being. Sexual activity. Eating habits. History of falls. Memory and ability to understand (cognition). Work and work Statistician. Screening  You may have the following tests or measurements: Height, weight, and BMI. Blood pressure. Lipid and cholesterol levels. These may be checked every 5 years, or more frequently if you are over 8 years old. Skin check. Lung cancer screening. You may have this screening every year starting at age 74 if you have a 30-pack-year history of smoking and currently smoke or have quit within the past 15 years. Fecal occult blood test (FOBT) of the stool. You may have this test every year starting at age 74. Flexible sigmoidoscopy or colonoscopy. You may have a sigmoidoscopy every 5 years or a colonoscopy every 10 years starting at age 74. Prostate cancer screening. Recommendations will vary depending on your family history and other risks. Hepatitis C blood test. Hepatitis B blood test. Sexually transmitted disease (STD) testing. Diabetes screening. This is done by checking your blood sugar (glucose) after you have not eaten for a while (fasting). You may have this done every 1-3 years. Abdominal aortic aneurysm (AAA) screening. You may need this if you are a current or former smoker. Osteoporosis. You may be screened starting at age 74 if you are at high risk. Talk with your health  care provider about your test results, treatment options, and if necessary, the need for more tests. Vaccines  Your health care provider may recommend  certain vaccines, such as: Influenza vaccine. This is recommended every year. Tetanus, diphtheria, and acellular pertussis (Tdap, Td) vaccine. You may need a Td booster every 10 years. Zoster vaccine. You may need this after age 67. Pneumococcal 13-valent conjugate (PCV13) vaccine. One dose is recommended after age 74. Pneumococcal polysaccharide (PPSV23) vaccine. One dose is recommended after age 74. Talk to your health care provider about which screenings and vaccines you need and how often you need them. This information is not intended to replace advice given to you by your health care provider. Make sure you discuss any questions you have with your health care provider. Document Released: 08/01/2015 Document Revised: 03/24/2016 Document Reviewed: 05/06/2015 Elsevier Interactive Patient Education  2017 [place] Prevention in the Home Falls can cause injuries. They can happen to people of all ages. There are many things you can do to make your home safe and to help prevent falls. What can I do on the outside of my home? Regularly fix the edges of walkways and driveways and fix any cracks. Remove anything that might make you trip as you walk through a door, such as a raised step or threshold. Trim any bushes or trees on the path to your home. Use bright outdoor lighting. Clear any walking paths of anything that might make someone trip, such as rocks or tools. Regularly check to see if handrails are loose or broken. Make sure that both sides of any steps have handrails. Any raised decks and porches should have guardrails on the edges. Have any leaves, snow, or ice cleared regularly. Use sand or salt on walking paths during winter. Clean up any spills in your garage right away. This includes oil or grease spills. What can I do in the bathroom? Use night lights. Install grab bars by the toilet and in the tub and shower. Do not use towel bars as grab bars. Use non-skid mats or  decals in the tub or shower. If you need to sit down in the shower, use a plastic, non-slip stool. Keep the floor dry. Clean up any water that spills on the floor as soon as it happens. Remove soap buildup in the tub or shower regularly. Attach bath mats securely with double-sided non-slip rug tape. Do not have throw rugs and other things on the floor that can make you trip. What can I do in the bedroom? Use night lights. Make sure that you have a light by your bed that is easy to reach. Do not use any sheets or blankets that are too big for your bed. They should not hang down onto the floor. Have a firm chair that has side arms. You can use this for support while you get dressed. Do not have throw rugs and other things on the floor that can make you trip. What can I do in the kitchen? Clean up any spills right away. Avoid walking on wet floors. Keep items that you use a lot in easy-to-reach places. If you need to reach something above you, use a strong step stool that has a grab bar. Keep electrical cords out of the way. Do not use floor polish or wax that makes floors slippery. If you must use wax, use non-skid floor wax. Do not have throw rugs and other things on the floor that can make you trip. What can  I do with my stairs? Do not leave any items on the stairs. Make sure that there are handrails on both sides of the stairs and use them. Fix handrails that are broken or loose. Make sure that handrails are as long as the stairways. Check any carpeting to make sure that it is firmly attached to the stairs. Fix any carpet that is loose or worn. Avoid having throw rugs at the top or bottom of the stairs. If you do have throw rugs, attach them to the floor with carpet tape. Make sure that you have a light switch at the top of the stairs and the bottom of the stairs. If you do not have them, ask someone to add them for you. What else can I do to help prevent falls? Wear shoes that: Do not  have high heels. Have rubber bottoms. Are comfortable and fit you well. Are closed at the toe. Do not wear sandals. If you use a stepladder: Make sure that it is fully opened. Do not climb a closed stepladder. Make sure that both sides of the stepladder are locked into place. Ask someone to hold it for you, if possible. Clearly mark and make sure that you can see: Any grab bars or handrails. First and last steps. Where the edge of each step is. Use tools that help you move around (mobility aids) if they are needed. These include: Canes. Walkers. Scooters. Crutches. Turn on the lights when you go into a dark area. Replace any light bulbs as soon as they burn out. Set up your furniture so you have a clear path. Avoid moving your furniture around. If any of your floors are uneven, fix them. If there are any pets around you, be aware of where they are. Review your medicines with your doctor. Some medicines can make you feel dizzy. This can increase your chance of falling. Ask your doctor what other things that you can do to help prevent falls. This information is not intended to replace advice given to you by your health care provider. Make sure you discuss any questions you have with your health care provider. Document Released: 05/01/2009 Document Revised: 12/11/2015 Document Reviewed: 08/09/2014 Elsevier Interactive Patient Education  2017 [employer].

## 2021-08-27 ENCOUNTER — Ambulatory Visit (INDEPENDENT_AMBULATORY_CARE_PROVIDER_SITE_OTHER): Payer: Medicare HMO | Admitting: Family Medicine

## 2021-08-27 ENCOUNTER — Other Ambulatory Visit: Payer: Self-pay

## 2021-08-27 VITALS — BP 124/82 | HR 71 | Temp 97.2°F | Ht 64.0 in | Wt 203.0 lb

## 2021-08-27 DIAGNOSIS — E786 Lipoprotein deficiency: Secondary | ICD-10-CM

## 2021-08-27 DIAGNOSIS — Z0000 Encounter for general adult medical examination without abnormal findings: Secondary | ICD-10-CM

## 2021-08-27 DIAGNOSIS — I10 Essential (primary) hypertension: Secondary | ICD-10-CM | POA: Diagnosis not present

## 2021-08-27 MED ORDER — TAMSULOSIN HCL 0.4 MG PO CAPS: ORAL_CAPSULE | ORAL | 3 refills | Status: DC

## 2021-08-27 NOTE — Progress Notes (Signed)
° °  Subjective:    Patient ID: Bradley [name], male    DOB: 08/06/1945, 76 y.o.   MRN: [id number]  HPI  The patient comes in today for a wellness visit.  Patient is trying to watch how he eats States he has lost weight Denies any setbacks No chest tightness pressure pain or shortness of breath Energy level overall doing fairly well   A review of their health history was completed.  A review of medications was also completed.  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   Additional concerns:  Psa and labs  has an appt w/ Dr [place] Crane for places on his back, neck, and head  Brother with Alzheimers Review of Systems     Objective:   Physical Exam General-in no acute distress Eyes-no discharge Lungs-respiratory rate normal, CTA CV-no murmurs,RRR Extremities skin warm dry no edema Neuro grossly normal Behavior normal, alert Prostate exam not indicated based on age       Assessment & Plan:  Lab work ordered [employer] Recommend walking for exercise Continue current medications Does go to the [place] Primarily gets his medicines through them although he is contemplating following through with [place] alone He does need refills on tamsulosin Safety dietary discussed Cognitive doing well

## 2021-08-27 NOTE — Patient Instructions (Signed)

## 2021-08-28 LAB — LIPID PANEL
Chol/HDL Ratio: 3.1 ratio (ref 0.0–5.0)
Cholesterol, Total: 113 mg/dL (ref 100–199)
HDL: 36 mg/dL — ABNORMAL LOW (ref >39)
LDL Chol Calc (NIH): 58 mg/dL (ref 0–99)
Triglycerides: 99 mg/dL (ref 0–149)
VLDL Cholesterol Cal: 19 mg/dL (ref 5–40)

## 2021-08-28 LAB — BASIC METABOLIC PANEL
BUN/Creatinine Ratio: 12 (ref 10–24)
BUN: 14 mg/dL (ref 8–27)
CO2: 25 mmol/L (ref 20–29)
Calcium: 9.4 mg/dL (ref 8.6–10.2)
Chloride: 104 mmol/L (ref 96–106)
Creatinine, Ser: 1.17 mg/dL (ref 0.76–1.27)
Glucose: 91 mg/dL (ref 70–99)
Potassium: 4.8 mmol/L (ref 3.5–5.2)
Sodium: 141 mmol/L (ref 134–144)
eGFR: 66 mL/min/{1.73_m2} (ref >59)

## 2021-08-28 LAB — CBC WITH DIFFERENTIAL/PLATELET
Basophils Absolute: 0 10*3/uL (ref 0.0–0.2)
Basos: 0 %
EOS (ABSOLUTE): 0.1 10*3/uL (ref 0.0–0.4)
Eos: 1 %
Hematocrit: 48.2 % (ref 37.5–51.0)
Hemoglobin: 15.9 g/dL (ref 13.0–17.7)
Immature Grans (Abs): 0 10*3/uL (ref 0.0–0.1)
Immature Granulocytes: 0 %
Lymphocytes Absolute: 2.4 10*3/uL (ref 0.7–3.1)
Lymphs: 35 %
MCH: 30.2 pg (ref 26.6–33.0)
MCHC: 33 g/dL (ref 31.5–35.7)
MCV: 92 fL (ref 79–97)
Monocytes Absolute: 0.5 10*3/uL (ref 0.1–0.9)
Monocytes: 7 %
Neutrophils Absolute: 3.8 10*3/uL (ref 1.4–7.0)
Neutrophils: 57 %
Platelets: 188 10*3/uL (ref 150–450)
RBC: 5.27 x10E6/uL (ref 4.14–5.80)
RDW: 15.7 % — ABNORMAL HIGH (ref 11.6–15.4)
WBC: 6.7 10*3/uL (ref 3.4–10.8)

## 2021-08-28 LAB — HEPATIC FUNCTION PANEL
ALT: 21 IU/L (ref 0–44)
AST: 39 IU/L (ref 0–40)
Albumin: 4.3 g/dL (ref 3.7–4.7)
Alkaline Phosphatase: 113 IU/L (ref 44–121)
Bilirubin Total: 0.7 mg/dL (ref 0.0–1.2)
Bilirubin, Direct: 0.18 mg/dL (ref 0.00–0.40)
Total Protein: 6.6 g/dL (ref 6.0–8.5)

## 2021-08-28 LAB — PSA: Prostate Specific Ag, Serum: 2.2 ng/mL (ref 0.0–4.0)

## 2021-08-29 ENCOUNTER — Encounter: Payer: Self-pay | Admitting: Family Medicine

## 2021-09-23 ENCOUNTER — Ambulatory Visit: Payer: Medicare HMO | Admitting: Internal Medicine

## 2021-09-23 ENCOUNTER — Encounter: Payer: Self-pay | Admitting: Internal Medicine

## 2021-09-23 ENCOUNTER — Other Ambulatory Visit: Payer: Self-pay

## 2021-09-23 VITALS — BP 130/80 | HR 64 | Temp 97.7°F | Ht 64.0 in | Wt 206.2 lb

## 2021-09-23 DIAGNOSIS — Z860100 Personal history of colon polyps, unspecified: Secondary | ICD-10-CM

## 2021-09-23 DIAGNOSIS — K2270 Barrett's esophagus without dysplasia: Secondary | ICD-10-CM | POA: Diagnosis not present

## 2021-09-23 NOTE — Progress Notes (Signed)
Primary Care Physician:  Kathyrn Drown, MD Primary Gastroenterologist:  Dr. Gala Romney  Pre-Procedure History & Physical: HPI:  Bradley [name] is a 76 y.o. male here for follow-up of GERD/Barrett's esophagus.  History of colonic adenomas.  2 colonic adenomas removed 2020.  Also 10 cm segment of Barrett's epithelium seen at EGD at the same time.  No dysplasia.  Patient is here to set up a 3-year surveillance examination.  GERD well controlled.  No dysphagia.  No bowel symptoms.  He is not diabetic and not anticoagulated.  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HCC)    Shingles    hx of, on back of neck   Sleep apnea    Noncompliant with CPAP   Trigeminal neuralgia     Past Surgical History:  Procedure Laterality Date   BIOPSY  08/02/2018   Procedure: BIOPSY;  Surgeon: Daneil Dolin, MD;  [facility];  Service: Endoscopy;;  gastric polyp   COLONOSCOPY     COLONOSCOPY  12/09/2004   EUM:PNTIRWERXV polyp at 35 cm biopsied/normal rectum   COLONOSCOPY   05/20/2008   QMG:QQPYP midsigmoid s/p snare removal/normal rectum. Tubular adenoma   COLONOSCOPY N/A 08/02/2018   Procedure: COLONOSCOPY;  Surgeon: Daneil Dolin, MD;  [facility];  Service: Endoscopy;  Laterality: N/A;  1:15pm   COLONOSCOPY WITH ESOPHAGOGASTRODUODENOSCOPY (EGD) N/A 03/22/2013   Dr. Gala Romney: Barrett's esophagus status post segmental biopsies with no evidence of dysplasia.  Normal colonoscopy.  Next colonoscopy in 5 years.   ESOPHAGOGASTRODUODENOSCOPY  07/01/2003   PJK:DTOIZT colored epithelium involving essentially nearly the distal onehalf of the tubular esophagus/Patulous esophagogastric junction/ Moderate sized hiatal hernia/Otherwise normal stomach, normal D1 and D2.   ESOPHAGOGASTRODUODENOSCOPY  12/09/2004    IWP:YKDXIP-JASNKNL epithelium  beginning at 33 cm consistent with previous diagnosis of Barrett's esophagus  segmentally biopsied. Hiatal hernia. Otherwise normal stomach. Normal D1 and D2   ESOPHAGOGASTRODUODENOSCOPY   05/20/2008   ZJQ:BHALPF-XTKWIOX epithelium distal esophagus consistent with Barrett's esophagus.  No significant change from prior exam. Multiple biopsies negative for dysplasia.   ESOPHAGOGASTRODUODENOSCOPY N/A 08/02/2018   Procedure: ESOPHAGOGASTRODUODENOSCOPY (EGD);  Surgeon: Daneil Dolin, MD;  [facility];  Service: Endoscopy;  Laterality: N/A;   POLYPECTOMY  08/02/2018   Procedure: POLYPECTOMY;  Surgeon: Daneil Dolin, MD;  [facility];  Service: Endoscopy;;  distal transverse colon(CSx1), rectal(CSx1)   TONSILLECTOMY      Prior to Admission medications   Medication Sig Start Date End Date Taking? Authorizing Provider  allopurinol (ZYLOPRIM) 100 MG tablet Take 100 mg by mouth daily after breakfast.    Yes [provider]  ALPRAZolam (XANAX) 1 MG tablet Take 1 mg by mouth 2 (two) times daily.   Yes [provider]  aspirin EC 81 MG tablet Take 81 mg by mouth at bedtime.    Yes [provider]  atorvastatin (LIPITOR) 40 MG tablet Take 40 mg by mouth every evening.    Yes [provider]  Cyanocobalamin (B-12 IJ) Inject 1 mL as directed every 30 (thirty) days.    Yes [provider]  diphenhydrAMINE (BENADRYL) 25 MG tablet Take 25 mg by mouth every 6 (six) hours as needed for allergies or sleep.  Yes [provider]  gabapentin (NEURONTIN) 300 MG capsule Take 3 capsules (900 mg total) by mouth 2 (two) times daily. 07/31/19  Yes Penumalli, Earlean Polka, MD  lisinopril (ZESTRIL) 40 MG tablet Take 40 mg by mouth daily.   Yes [provider]  naproxen sodium (ANAPROX) 275 MG tablet Take 275 mg by mouth 2 (two) times daily with a meal.   Yes [provider]  omeprazole (PRILOSEC) 20 MG  capsule Take 20 mg by mouth daily after breakfast.    Yes [provider]  PRESCRIPTION MEDICATION carbemazepine   Yes [provider]  pyridostigmine (MESTINON) 60 MG tablet Take 1 tablet (60 mg total) by mouth 2 (two) times daily. 07/24/19  Yes Penumalli, Earlean Polka, MD  tamsulosin (FLOMAX) 0.4 MG CAPS capsule 2 qhs for BPH 08/27/21  Yes Luking, Scott A, MD  furosemide (LASIX) 20 MG tablet Take 20 mg by mouth daily as needed for fluid or edema. Patient not taking: Reported on 09/23/2021    [provider]  sildenafil (VIAGRA) 100 MG tablet Take 100 mg by mouth daily as needed for erectile dysfunction. Patient not taking: Reported on 08/27/2021    [provider]  SODIUM FLUORIDE 5000 PPM 1.1 % PSTE Take by mouth. Patient not taking: Reported on 09/23/2021 09/22/20   [provider]  sulfamethoxazole-trimethoprim (BACTRIM DS) 800-160 MG tablet Take 1 tablet by mouth 2 (two) times daily. Patient not taking: Reported on 09/23/2021 06/19/21   Nilda Simmer, NP    Allergies as of 09/23/2021   (No Known Allergies)    Family History  Problem Relation Age of Onset   Heart attack Father 104       First MI in 49s   Cancer Brother        prostate   Colon cancer Mother        age greater than 55s   Cancer Sister        brain   COPD Brother     Social History   Socioeconomic History   Marital status: Married    Spouse name: Merchandiser, retail   Number of children: 2   Years of education: 14   Highest education level: Not on file  Occupational History   Occupation: Retired    Comment: Hotel manager  Tobacco Use   Smoking status: Never   Smokeless tobacco: Never  Substance and Sexual Activity   Alcohol use: No   Drug use: No   Sexual activity: Yes  Other Topics Concern   Not on file  Social History Narrative   Lives with wife   2 children, 1 passed away   caffeine- coffee 1-2 daily   Social Determinants of Health   Financial Resource Strain:  Low Risk    Difficulty of Paying Living Expenses: Not very hard  Food Insecurity: No Food Insecurity   Worried About Charity fundraiser in the Last Year: Never true   [place] in the Last Year: Never true  Transportation Needs: No Transportation Needs   Lack of Transportation (Medical): No   Lack of Transportation (Non-Medical): No  Physical Activity: Insufficiently Active   Days of Exercise per Week: 5 days   Minutes of Exercise per Session: 20 min  Stress: No Stress Concern Present   Feeling of Stress : Only a little  Social Connections: Engineer, building services of Communication with Friends and Family: More than three times a week   Frequency of  Social Gatherings with Friends and Family: More than three times a week   Attends Religious Services: More than 4 times per year   Active Member of [employer] or Organizations: Yes   Attends Music therapist: More than 4 times per year   Marital Status: Married  Human resources officer Violence: Not At Risk   Fear of Current or Ex-Partner: No   Emotionally Abused: No   Physically Abused: No   Sexually Abused: No    Review of Systems: See HPI, otherwise negative ROS  Physical Exam: BP 130/80 (BP Location: Left Arm, Patient Position: Sitting, Cuff Size: Large)    Pulse 64    Temp 97.7 F (36.5 C) (Temporal)    Ht '5\' 4"'$  (1.626 m)    Wt 206 lb 3.2 oz (93.5 kg)    SpO2 97%    BMI 35.39 kg/m  General:   Alert,   pleasant and cooperative in NAD.  Accompanied by his spouse. Neck:  Supple; no masses or thyromegaly. No significant cervical adenopathy. Lungs:  Clear throughout to auscultation.   No wheezes, crackles, or rhonchi. No acute distress. Heart:  Regular rate and rhythm; no murmurs, clicks, rubs,  or gallops. Abdomen: Non-distended, normal bowel sounds.  Soft and nontender without appreciable mass or hepatosplenomegaly.  Pulses:  Normal pulses noted. Extremities:  Without clubbing or edema.  Impression/Plan: Very  pleasant 76 year old gentleman with a longstanding GERD and Barrett's esophagus.  Here for surveillance EGD. I have offered the patient a surveillance EGD per plan.The risks, benefits, limitations, alternatives and imponderables have been reviewed with the patient. Potential for esophageal dilation, biopsy, etc. have also been reviewed.  Questions have been answered. All parties agreeable.  The risks, benefits, limitations, alternatives and imponderables have been reviewed with the patient. Potential for esophageal dilation, biopsy, etc. have also been reviewed.  Questions have been answered. All parties agreeable.   Tentatively plan for repeat colonoscopy for surveillance purposes 2025.     Notice: This dictation was prepared with Dragon dictation along with smaller phrase technology. Any transcriptional errors that result from this process are unintentional and may not be corrected upon review.

## 2021-09-23 NOTE — Patient Instructions (Signed)
It was good to see you again today! ? ?As discussed, we will schedule EGD for surveillance (Barrett's esophagus).  ASA 3 ? ?Continue omeprazole 20 mg daily indefinitely ? ?We will plan for a repeat colonoscopy (history of polyps) in 2025 ? ?Further recommendations to follow. ?

## 2021-09-23 NOTE — Progress Notes (Signed)
edf

## 2021-09-24 ENCOUNTER — Telehealth: Payer: Self-pay

## 2021-09-24 NOTE — Telephone Encounter (Signed)
Called pt, EGD w/Propofol ASA 3 w/Dr. Gala Romney scheduled for 11/05/21 at 8:15am. Orders entered. ?

## 2021-09-24 NOTE — Telephone Encounter (Signed)
Pre-op appt 11/03/21. Appt letter mailed with procedure instructions. ?

## 2021-11-02 NOTE — Patient Instructions (Signed)
? ? ? ? ? ? Bradley [name] ? 11/02/2021  ?  ? '@PREFPERIOPPHARMACY'$ @ ? ? Your procedure is scheduled on  11/05/2021. ? ? Report to [facility] at  [place]  A.M. ? ? Call this number if you have problems the morning of surgery: ? [phone] ? ? Remember: ? Follow the diet instructions given to you by the office. ?  ? Take these medicines the morning of surgery with A SIP OF WATER  ? ?   allopurinol, xanax(if needed), tegretol, gabapentin, prilosec, mestinon. ?  ? ? Do not wear jewelry, make-up or nail polish. ? Do not wear lotions, powders, or perfumes, or deodorant. ? Do not shave 48 hours prior to surgery.  Men may shave face and neck. ? Do not bring valuables to the hospital. ?  is not responsible for any belongings or valuables. ? ?Contacts, dentures or bridgework may not be worn into surgery.  Leave your suitcase in the car.  After surgery it may be brought to your room. ? ?For patients admitted to the hospital, discharge time will be determined by your treatment team. ? ?Patients discharged the day of surgery will not be allowed to drive home and must have someone with them for 24 hours.  ? ? ?Special instructions:   DO NOT smoke tobacco or vape for 24 hours before your procedure. ? ?Please read over the following fact sheets that you were given. ?Anesthesia Post-op Instructions and Care and Recovery After Surgery ?  ? ? ? Upper Endoscopy, Adult, Care After ?This sheet gives you information about how to care for yourself after your procedure. Your health care provider may also give you more specific instructions. If you have problems or questions, contact your health care provider. ?What can I expect after the procedure? ?After the procedure, it is common to have: ?A sore throat. ?Mild stomach pain or discomfort. ?Bloating. ?Nausea. ?Follow these instructions at home: ? ?Follow instructions from your health care provider about what to eat or drink after your procedure. ?Return to your normal activities  as told by your health care provider. Ask your health care provider what activities are safe for you. ?Take over-the-counter and prescription medicines only as told by your health care provider. ?If you were given a sedative during the procedure, it can affect you for several hours. Do not drive or operate machinery until your health care provider says that it is safe. ?Keep all follow-up visits as told by your health care provider. This is important. ?Contact a health care provider if you have: ?A sore throat that lasts longer than one day. ?Trouble swallowing. ?Get help right away if: ?You vomit blood or your vomit looks like coffee grounds. ?You have: ?A fever. ?Bloody, black, or tarry stools. ?A severe sore throat or you cannot swallow. ?Difficulty breathing. ?Severe pain in your chest or abdomen. ?Summary ?After the procedure, it is common to have a sore throat, mild stomach discomfort, bloating, and nausea. ?If you were given a sedative during the procedure, it can affect you for several hours. Do not drive or operate machinery until your health care provider says that it is safe. ?Follow instructions from your health care provider about what to eat or drink after your procedure. ?Return to your normal activities as told by your health care provider. ?This information is not intended to replace advice given to you by your health care provider. Make sure you discuss any questions you have with your health care provider. ?Document  Revised: 05/11/2019 Document Reviewed: 12/05/2017 ?Elsevier Patient Education ? [place]. ?Monitored Anesthesia Care, Care After ?This sheet gives you information about how to care for yourself after your procedure. Your health care provider may also give you more specific instructions. If you have problems or questions, contact your health care provider. ?What can I expect after the procedure? ?After the procedure, it is common to have: ?Tiredness. ?Forgetfulness about what  happened after the procedure. ?Impaired judgment for important decisions. ?Nausea or vomiting. ?Some difficulty with balance. ?Follow these instructions at home: ?For the time period you were told by your health care provider: ? ?  ? ?Rest as needed. ?Do not participate in activities where you could fall or become injured. ?Do not drive or use machinery. ?Do not drink alcohol. ?Do not take sleeping pills or medicines that cause drowsiness. ?Do not make important decisions or sign legal documents. ?Do not take care of children on your own. ?Eating and drinking ?Follow the diet that is recommended by your health care provider. ?Drink enough fluid to keep your urine pale yellow. ?If you vomit: ?Drink water, juice, or soup when you can drink without vomiting. ?Make sure you have little or no nausea before eating solid foods. ?General instructions ?Have a responsible adult stay with you for the time you are told. It is important to have someone help care for you until you are awake and alert. ?Take over-the-counter and prescription medicines only as told by your health care provider. ?If you have sleep apnea, surgery and certain medicines can increase your risk for breathing problems. Follow instructions from your health care provider about wearing your sleep device: ?Anytime you are sleeping, including during daytime naps. ?While taking prescription pain medicines, sleeping medicines, or medicines that make you drowsy. ?Avoid smoking. ?Keep all follow-up visits as told by your health care provider. This is important. ?Contact a health care provider if: ?You keep feeling nauseous or you keep vomiting. ?You feel light-headed. ?You are still sleepy or having trouble with balance after 24 hours. ?You develop a rash. ?You have a fever. ?You have redness or swelling around the IV site. ?Get help right away if: ?You have trouble breathing. ?You have new-onset confusion at home. ?Summary ?For several hours after your procedure,  you may feel tired. You may also be forgetful and have poor judgment. ?Have a responsible adult stay with you for the time you are told. It is important to have someone help care for you until you are awake and alert. ?Rest as told. Do not drive or operate machinery. Do not drink alcohol or take sleeping pills. ?Get help right away if you have trouble breathing, or if you suddenly become confused. ?This information is not intended to replace advice given to you by your health care provider. Make sure you discuss any questions you have with your health care provider. ?Document Revised: 06/09/2021 Document Reviewed: 06/07/2019 ?Elsevier Patient Education ? [place]. ? ?

## 2021-11-03 ENCOUNTER — Encounter (HOSPITAL_COMMUNITY)
Admission: RE | Admit: 2021-11-03 | Discharge: 2021-11-03 | Disposition: A | Discharge status: Home / Self Care | Payer: Medicare HMO | Source: Ambulatory Visit | Attending: Internal Medicine | Admitting: Internal Medicine

## 2021-11-03 ENCOUNTER — Encounter (HOSPITAL_COMMUNITY): Payer: Self-pay

## 2021-11-03 ENCOUNTER — Other Ambulatory Visit: Payer: Self-pay

## 2021-11-03 DIAGNOSIS — Z01810 Encounter for preprocedural cardiovascular examination: Secondary | ICD-10-CM | POA: Insufficient documentation

## 2021-11-05 ENCOUNTER — Ambulatory Visit (HOSPITAL_COMMUNITY)
Admission: RE | Admit: 2021-11-05 | Discharge: 2021-11-05 | Disposition: A | Discharge status: Home / Self Care | Payer: Medicare HMO | Attending: Internal Medicine | Admitting: Internal Medicine

## 2021-11-05 ENCOUNTER — Other Ambulatory Visit: Payer: Self-pay

## 2021-11-05 ENCOUNTER — Encounter (HOSPITAL_COMMUNITY): Payer: Self-pay | Admitting: Internal Medicine

## 2021-11-05 ENCOUNTER — Encounter (HOSPITAL_COMMUNITY)
Admission: RE | Disposition: A | Discharge status: Home / Self Care | Payer: Self-pay | Source: Home / Self Care | Attending: Internal Medicine

## 2021-11-05 ENCOUNTER — Ambulatory Visit (HOSPITAL_COMMUNITY): Payer: Medicare HMO | Admitting: Anesthesiology

## 2021-11-05 ENCOUNTER — Ambulatory Visit (HOSPITAL_BASED_OUTPATIENT_CLINIC_OR_DEPARTMENT_OTHER): Payer: Medicare HMO | Admitting: Anesthesiology

## 2021-11-05 DIAGNOSIS — G4730 Sleep apnea, unspecified: Secondary | ICD-10-CM | POA: Insufficient documentation

## 2021-11-05 DIAGNOSIS — Z09 Encounter for follow-up examination after completed treatment for conditions other than malignant neoplasm: Secondary | ICD-10-CM | POA: Insufficient documentation

## 2021-11-05 DIAGNOSIS — I10 Essential (primary) hypertension: Secondary | ICD-10-CM

## 2021-11-05 DIAGNOSIS — K2270 Barrett's esophagus without dysplasia: Secondary | ICD-10-CM

## 2021-11-05 DIAGNOSIS — F419 Anxiety disorder, unspecified: Secondary | ICD-10-CM | POA: Diagnosis not present

## 2021-11-05 DIAGNOSIS — K31A0 Gastric intestinal metaplasia, unspecified: Secondary | ICD-10-CM | POA: Diagnosis not present

## 2021-11-05 DIAGNOSIS — K219 Gastro-esophageal reflux disease without esophagitis: Secondary | ICD-10-CM | POA: Diagnosis not present

## 2021-11-05 DIAGNOSIS — K449 Diaphragmatic hernia without obstruction or gangrene: Secondary | ICD-10-CM | POA: Insufficient documentation

## 2021-11-05 HISTORY — PX: ESOPHAGOGASTRODUODENOSCOPY (EGD) WITH PROPOFOL: SHX5813

## 2021-11-05 HISTORY — PX: BIOPSY: SHX5522

## 2021-11-05 SURGERY — ESOPHAGOGASTRODUODENOSCOPY (EGD) WITH PROPOFOL
Anesthesia: General

## 2021-11-05 MED ORDER — PROPOFOL 10 MG/ML IV BOLUS
INTRAVENOUS | Status: DC | PRN
Start: 2021-11-05 — End: 2021-11-05
  Administered 2021-11-05: 50 mg via INTRAVENOUS
  Administered 2021-11-05: 30 mg via INTRAVENOUS
  Administered 2021-11-05: 50 mg via INTRAVENOUS
  Administered 2021-11-05: 30 mg via INTRAVENOUS

## 2021-11-05 MED ORDER — LIDOCAINE HCL 1 % IJ SOLN
INTRAMUSCULAR | Status: DC | PRN
  Administered 2021-11-05: 50 mg via INTRADERMAL

## 2021-11-05 MED ORDER — LIDOCAINE HCL (PF) 2 % IJ SOLN
INTRAMUSCULAR | Status: AC
  Filled 2021-11-05: qty 5

## 2021-11-05 MED ORDER — PROPOFOL 1000 MG/100ML IV EMUL
INTRAVENOUS | Status: AC
  Filled 2021-11-05: qty 200

## 2021-11-05 MED ORDER — LACTATED RINGERS IV SOLN: INTRAVENOUS | Status: DC | PRN

## 2021-11-05 MED ORDER — STERILE WATER FOR IRRIGATION IR SOLN
Status: DC | PRN
  Administered 2021-11-05: 60 mL

## 2021-11-05 NOTE — Op Note (Signed)
[facility] ?Patient Name: Bradley [name] ?Procedure Date: 11/05/2021 8:15 AM ?MRN: [id number] ?Date of Birth: 09/14/1945 ?Attending MD: Norvel Richards , MD ?CSN: [id number] ?Age: 76 ?Admit Type: Outpatient ?Procedure:                Upper GI endoscopy ?Indications:              Surveillance for malignancy due to personal history  ?                          of Barrett's esophagus ?Providers:                Norvel Richards, MD, Caprice Kluver, Eugene Garnet  ?                          Shanon Brow, Technician ?Referring MD:              ?Medicines:                Propofol per Anesthesia ?Complications:            No immediate complications. ?Estimated Blood Loss:     Estimated blood loss was minimal. ?Procedure:                Pre-Anesthesia Assessment: ?                          - Prior to the procedure, a History and Physical  ?                          was performed, and patient medications and  ?                          allergies were reviewed. The patient's tolerance of  ?                          previous anesthesia was also reviewed. The risks  ?                          and benefits of the procedure and the sedation  ?                          options and risks were discussed with the patient.  ?                          All questions were answered, and informed consent  ?                          was obtained. Prior Anticoagulants: The patient has  ?                          taken no previous anticoagulant or antiplatelet  ?                          agents. ASA Grade Assessment: III - A patient with  ?  severe systemic disease. After reviewing the risks  ?                          and benefits, the patient was deemed in  ?                          satisfactory condition to undergo the procedure. ?                          After obtaining informed consent, the endoscope was  ?                          passed under direct vision. Throughout the  ?                          procedure, the  patient's blood pressure, pulse, and  ?                          oxygen saturations were monitored continuously. The  ?                          GIF-H190 ([id number]) scope was introduced through the  ?                          mouth, and advanced to the second part of duodenum.  ?                          The upper GI endoscopy was accomplished without  ?                          difficulty. The patient tolerated the procedure  ?                          well. ?Scope In: 8:25:33 AM ?Scope Out: 8:34:08 AM ?Total Procedure Duration: 0 hours 8 minutes 35 seconds  ?Findings: ?     Barrett's epithelium 29 cm - 39 cm; inlet patch also noted. No  ?     nodularity or esophagitis. Small hiatal hernia. Multiple fundic gland  ?     gastric polyps. (Previously biopsied). No ulcer or infiltrating process  ?     or other abnormality. Pylorus patent. ?     Normal-appearing first and second portion of the duodenum. Segmental  ?     biopsies of the esophagus taken every 2 cm ?Impression:               - Inlet patch. Barrett's epithelium appears stable  ?                          - status post segmental biopsy. Small hiatal  ?                          hernia. Fundal gland polyps. ?Moderate Sedation: ?     Moderate (conscious) sedation was personally administered by an  ?     anesthesia professional. The following parameters were monitored: oxygen  ?  saturation, heart rate, blood pressure, respiratory rate, EKG, adequacy  ?     of pulmonary ventilation, and response to care. ?Recommendation:           - Patient has a contact number available for  ?                          emergencies. The signs and symptoms of potential  ?                          delayed complications were discussed with the  ?                          patient. Return to normal activities tomorrow.  ?                          Written discharge instructions were provided to the  ?                          patient. ?                          - Advance diet as  tolerated. Continue current  ?                          medications. Follow-up on pathology. ?                          - Continue present medications. ?                          - Repeat upper endoscopy after studies are complete  ?                          for surveillance. ?                          - Return to GI clinic (date not yet determined). ?Procedure Code(s):        --- Professional --- ?                          [phone], Esophagogastroduodenoscopy, flexible,  ?                          transoral; diagnostic, including collection of  ?                          specimen(s) by brushing or washing, when performed  ?                          (separate procedure) ?Diagnosis Code(s):        --- Professional --- ?                          K22.70, Barrett's esophagus without dysplasia ?CPT copyright 2019 American Medical Association. All rights reserved. ?The codes documented in this report are preliminary and upon coder review may  ?be revised  to meet current compliance requirements. ?Cristopher Estimable. Johnnette Laux, MD ?Norvel Richards, MD ?11/05/2021 8:48:52 AM ?This report has been signed electronically. ?Number of Addenda: 0 ?

## 2021-11-05 NOTE — Discharge Instructions (Signed)
EGD ?Discharge instructions ?Please read the instructions outlined below and refer to this sheet in the next few weeks. These discharge instructions provide you with general information on caring for yourself after you leave the hospital. Your doctor may also give you specific instructions. While your treatment has been planned according to the most current medical practices available, unavoidable complications occasionally occur. If you have any problems or questions after discharge, please call your doctor. ?ACTIVITY ?You may resume your regular activity but move at a slower pace for the next 24 hours.  ?Take frequent rest periods for the next 24 hours.  ?Walking will help expel (get rid of) the air and reduce the bloated feeling in your abdomen.  ?No driving for 24 hours (because of the anesthesia (medicine) used during the test).  ?You may shower.  ?Do not sign any important legal documents or operate any machinery for 24 hours (because of the anesthesia used during the test).  ?NUTRITION ?Drink plenty of fluids.  ?You may resume your normal diet.  ?Begin with a light meal and progress to your normal diet.  ?Avoid alcoholic beverages for 24 hours or as instructed by your caregiver.  ?MEDICATIONS ?You may resume your normal medications unless your caregiver tells you otherwise.  ?WHAT YOU CAN EXPECT TODAY ?You may experience abdominal discomfort such as a feeling of fullness or ?gas? pains.  ?FOLLOW-UP ?Your doctor will discuss the results of your test with you.  ?SEEK IMMEDIATE MEDICAL ATTENTION IF ANY OF THE FOLLOWING OCCUR: ?Excessive nausea (feeling sick to your stomach) and/or vomiting.  ?Severe abdominal pain and distention (swelling).  ?Trouble swallowing.  ?Temperature over 101? F (37.8? C).  ?Rectal bleeding or vomiting of blood.  ? ? ?Barrett's.  Stable biopsies taken. ? ?Further recommendations to follow pending review of pathology report ? ?At patient request, called Ginger Gaus at [phone]  -rolled to voicemail.  Message left. ?

## 2021-11-05 NOTE — Anesthesia Postprocedure Evaluation (Signed)
Anesthesia Post Note ? ?Patient: Bradley [name] ? ?Procedure(s) Performed: ESOPHAGOGASTRODUODENOSCOPY (EGD) WITH PROPOFOL ?BIOPSY ? ?Patient location during evaluation: Short Stay ?Anesthesia Type: General ?Level of consciousness: awake and alert ?Pain management: pain level controlled ?Vital Signs Assessment: post-procedure vital signs reviewed and stable ?Respiratory status: spontaneous breathing ?Cardiovascular status: blood pressure returned to baseline and stable ?Postop Assessment: no apparent nausea or vomiting ?Anesthetic complications: no ? ? ?No notable events documented. ? ? ?Last Vitals:  ?Vitals:  ? 11/05/21 0756  ?BP: 139/85  ?Pulse: (!) 58  ?Resp: 17  ?Temp: 36.5 ?C  ?SpO2: 97%  ?  ?Last Pain:  ?Vitals:  ? 11/05/21 0821  ?TempSrc:   ?PainSc: 0-No pain  ? ? ?  ?  ?  ?  ?  ?  ? ?[name] ? ? ? ? ?

## 2021-11-05 NOTE — H&P (Signed)
$'@LOGO'Y$ @ ? ? ?Primary Care Physician:  Kathyrn Drown, MD ?Primary Gastroenterologist:  Dr. Gala Romney ? ?Pre-Procedure History & Physical: ?HPI:  Bradley [name] is a 76 y.o. male here for surveillance colonoscopy.  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  Patient states GERD symptoms well controlled.  No dysphagia.  Here for surveillance. ? ?Past Medical History:  ?Diagnosis Date  ? Barrett esophagus   ? History of  ? GERD (gastroesophageal reflux disease)   ? Gout   ? History of adenomatous polyp of colon   ? Hyperlipidemia   ? Hypertension   ? 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 Shingles   ? hx of, on back of neck  ? Sleep apnea   ? Noncompliant with CPAP  ? Trigeminal neuralgia   ? ? ?Past Surgical History:  ?Procedure Laterality Date  ? BIOPSY  08/02/2018  ? Procedure: BIOPSY;  Surgeon: Daneil Dolin, MD;  [facility];  Service: Endoscopy;;  gastric polyp  ? COLONOSCOPY    ? COLONOSCOPY  12/09/2004  ? DTO:IZTIWPYKDX polyp at 35 cm biopsied/normal rectum  ? COLONOSCOPY   05/20/2008  ? IPJ:ASNKN midsigmoid s/p snare removal/normal rectum. Tubular adenoma  ? COLONOSCOPY N/A 08/02/2018  ? Procedure: COLONOSCOPY;  Surgeon: Daneil Dolin, MD;  Location: [facility];  Service: Endoscopy;  Laterality: N/A;  1:15pm  ? COLONOSCOPY WITH ESOPHAGOGASTRODUODENOSCOPY (EGD) N/A 03/22/2013  ? Dr. Gala Romney: Barrett's esophagus status post segmental biopsies with no evidence of dysplasia.  Normal colonoscopy.  Next colonoscopy in 5 years.  ? ESOPHAGOGASTRODUODENOSCOPY  07/01/2003  ? LZJ:QBHALP colored epithelium involving essentially nearly the distal onehalf of the tubular esophagus/Patulous esophagogastric junction/ Moderate sized hiatal hernia/Otherwise normal stomach, normal D1 and D2.  ? ESOPHAGOGASTRODUODENOSCOPY  12/09/2004  ? FXT:KWIOXB-DZHGDJM epithelium  beginning at 33 cm consistent with previous diagnosis of Barrett's esophagus  segmentally biopsied. Hiatal hernia. Otherwise  normal stomach. Normal D1 and D2  ? ESOPHAGOGASTRODUODENOSCOPY   05/20/2008  ? EQA:STMHDQ-QIWLNLG epithelium distal esophagus consistent with Barrett's esophagus.  No significant change from prior exam. Multiple biopsies negative for dysplasia.  ? ESOPHAGOGASTRODUODENOSCOPY N/A 08/02/2018  ? Procedure: ESOPHAGOGASTRODUODENOSCOPY (EGD);  Surgeon: Daneil Dolin, MD;  [facility];  Service: Endoscopy;  Laterality: N/A;  ? POLYPECTOMY  08/02/2018  ? Procedure: POLYPECTOMY;  Surgeon: Daneil Dolin, MD;  Location: [facility];  Service: Endoscopy;;  distal transverse colon(CSx1), rectal(CSx1)  ? TONSILLECTOMY    ? ? ?Prior to Admission medications   ?Medication Sig Start Date End Date Taking? Authorizing Provider  ?allopurinol (ZYLOPRIM) 100 MG tablet Take 100 mg by mouth daily after breakfast.    Yes [provider]  ?ALPRAZolam (XANAX) 1 MG tablet Take 1 mg by mouth 2 (two) times daily.   Yes [provider]  ?aspirin EC 81 MG tablet Take 81 mg by mouth at bedtime.    Yes [provider]  ?atorvastatin (LIPITOR) 40 MG tablet Take 40 mg by mouth every evening.    Yes [provider]  ?carbamazepine (TEGRETOL) 200 MG tablet Take 200 mg by mouth 2 (two) times daily as needed (neuralgia).   Yes [provider]  ?cyanocobalamin (,VITAMIN B-12,) 1000 MCG/ML injection Inject 1,000 mcg into the muscle every 30 (thirty) days.   Yes [provider]  ?furosemide (LASIX) 20 MG tablet Take 20 mg by mouth daily as needed for fluid or edema.   Yes [provider]  ?gabapentin (NEURONTIN)  300 MG capsule Take 3 capsules (900 mg total) by mouth 2 (two) times daily. ?Patient taking differently: Take 300 mg by mouth 3 (three) times daily. 07/31/19  Yes Penumalli, Earlean Polka, MD  ?lisinopril (ZESTRIL) 40 MG tablet Take 40 mg by mouth daily as needed (high blood pressure).   Yes [provider]  ?omeprazole (PRILOSEC) 20 MG capsule Take 20 mg by mouth 2  (two) times daily before a meal.   Yes [provider]  ?pyridostigmine (MESTINON) 60 MG tablet Take 1 tablet (60 mg total) by mouth 2 (two) times daily. ?Patient taking differently: Take 60 mg by mouth 3 (three) times daily. 07/24/19  Yes Penumalli, Vikram R, MD  ?SODIUM FLUORIDE 5000 PPM 1.1 % PSTE Place 1 application. onto teeth at bedtime. 09/22/20  Yes [provider]  ?sulfamethoxazole-trimethoprim (BACTRIM DS) 800-160 MG tablet Take 1 tablet by mouth 2 (two) times daily. 06/19/21  Yes Nilda Simmer, NP  ?tamsulosin (FLOMAX) 0.4 MG CAPS capsule 2 qhs for BPH ?Patient taking differently: Take 0.8 mg by mouth at bedtime. 2 qhs for BPH 08/27/21  Yes Luking, Elayne Snare, MD  ?diphenhydrAMINE (BENADRYL) 25 MG tablet Take 25 mg by mouth every 6 (six) hours as needed for allergies or sleep.     [provider]  ?naproxen sodium (ANAPROX) 275 MG tablet Take 275 mg by mouth 2 (two) times daily as needed for moderate pain.    [provider]  ? ? ?Allergies as of 09/24/2021  ? (No Known Allergies)  ? ? ?Family History  ?Problem Relation Age of Onset  ? Heart attack Father 70  ?     First MI in 68s  ? Cancer Brother   ?     prostate  ? Colon cancer Mother   ?     age greater than 64s  ? Cancer Sister   ?     brain  ? COPD Brother   ? ? ?Social History  ? ?Socioeconomic History  ? Marital status: Married  ?  Spouse name: Irean Hong  ? Number of children: 2  ? Years of education: 87  ? Highest education level: Not on file  ?Occupational History  ? Occupation: Retired  ?  Comment: [employer]  ?Tobacco Use  ? Smoking status: Never  ? Smokeless tobacco: Never  ?Substance and Sexual Activity  ? Alcohol use: No  ? Drug use: No  ? Sexual activity: Yes  ?Other Topics Concern  ? Not on file  ?Social History Narrative  ? Lives with wife  ? 2 children, 1 passed away  ? caffeine- coffee 1-2 daily  ? ?Social Determinants of Health  ? ?Financial Resource Strain: Low Risk   ? Difficulty of Paying  Living Expenses: Not very hard  ?Food Insecurity: No Food Insecurity  ? Worried About Charity fundraiser in the Last Year: Never true  ? Ran Out of Food in the Last Year: Never true  ?Transportation Needs: No Transportation Needs  ? Lack of Transportation (Medical): No  ? Lack of Transportation (Non-Medical): No  ?Physical Activity: Insufficiently Active  ? Days of Exercise per Week: 5 days  ? Minutes of Exercise per Session: 20 min  ?Stress: No Stress Concern Present  ? Feeling of Stress : Only a little  ?Social Connections: Socially Integrated  ? Frequency of Communication with Friends and Family: More than three times a week  ? Frequency of Social Gatherings with Friends and Family: More than  three times a week  ? Attends Religious Services: More than 4 times per year  ? Active Member of Clubs or Organizations: Yes  ? Attends Archivist Meetings: More than 4 times per year  ? Marital Status: Married  ?Intimate Partner Violence: Not At Risk  ? Fear of Current or Ex-Partner: No  ? Emotionally Abused: No  ? Physically Abused: No  ? Sexually Abused: No  ? ? ?Review of Systems: ?See HPI, otherwise negative ROS ? ?Physical Exam: ?BP 139/85   Pulse (!) 58   Temp 97.7 ?F (36.5 ?C) (Oral)   Resp 17   Ht '5\' 4"'$  (1.626 m)   Wt 88 kg   SpO2 97%   BMI 33.30 kg/m?  ?General:   Alert,  Well-developed, well-nourished, pleasant and cooperative in NAD ?Neck:  Supple; no masses or thyromegaly. No significant cervical adenopathy. ?Lungs:  Clear throughout to auscultation.   No wheezes, crackles, or rhonchi. No acute distress. ?Heart:  Regular rate and rhythm; no murmurs, clicks, rubs,  or gallops. ?Abdomen: Non-distended, normal bowel sounds.  Soft and nontender without appreciable mass or hepatosplenomegaly.  ?Pulses:  Normal pulses noted. ?Extremities:  Without clubbing or edema. ? ?Impression/Plan: 76 year old gentleman with a history of Barrett's esophagus.  No history of dysplasia.  No upper GI tract symptoms  at this time.  I have offered the patient a diagnostic EGD per plan.The risks, benefits, limitations, alternatives and imponderables have been reviewed with the patient. Potential for esophageal dilation, biopsy, et

## 2021-11-05 NOTE — Anesthesia Preprocedure Evaluation (Signed)
Anesthesia Evaluation  ?Patient identified by MRN, date of birth, ID band ?Patient awake ? ? ? ?Reviewed: ?Allergy & Precautions, H&P , NPO status , Patient's Chart, lab work & pertinent test results, reviewed documented beta blocker date and time  ? ?Airway ?Mallampati: II ? ?TM Distance: >3 FB ?Neck ROM: full ? ? ? Dental ?no notable dental hx. ? ?  ?Pulmonary ?sleep apnea ,  ?  ?Pulmonary exam normal ?breath sounds clear to auscultation ? ? ? ? ? ? Cardiovascular ?Exercise Tolerance: Good ?hypertension, negative cardio ROS ? ? ?Rhythm:regular Rate:Normal ? ? ?  ?Neuro/Psych ?PSYCHIATRIC DISORDERS Anxiety  Neuromuscular disease   ? GI/Hepatic ?Neg liver ROS, GERD  Medicated,  ?Endo/Other  ?negative endocrine ROS ? Renal/GU ?negative Renal ROS  ?negative genitourinary ?  ?Musculoskeletal ? ? Abdominal ?  ?Peds ? Hematology ? ?(+) Blood dyscrasia, anemia ,   ?Anesthesia Other Findings ? ? Reproductive/Obstetrics ?negative OB ROS ? ?  ? ? ? ? ? ? ? ? ? ? ? ? ? ?  ?  ? ? ? ? ? ? ? ? ?Anesthesia Physical ?Anesthesia Plan ? ?ASA: 3 ? ?Anesthesia Plan: General  ? ?Post-op Pain Management:   ? ?Induction:  ? ?PONV Risk Score and Plan: Propofol infusion ? ?Airway Management Planned:  ? ?Additional Equipment:  ? ?Intra-op Plan:  ? ?Post-operative Plan:  ? ?Informed Consent: I have reviewed the patients History and Physical, chart, labs and discussed the procedure including the risks, benefits and alternatives for the proposed anesthesia with the patient or authorized representative who has indicated his/her understanding and acceptance.  ? ? ? ?Dental Advisory Given ? ?Plan Discussed with: CRNA ? ?Anesthesia Plan Comments:   ? ? ? ? ? ? ?Anesthesia Quick Evaluation ? ?

## 2021-11-05 NOTE — Transfer of Care (Signed)
Immediate Anesthesia Transfer of Care Note ? ?Patient: Bradley [name] ? ?Procedure(s) Performed: ESOPHAGOGASTRODUODENOSCOPY (EGD) WITH PROPOFOL ?BIOPSY ? ?Patient Location: Short Stay ? ?Anesthesia Type:General ? ?Level of Consciousness: awake ? ?Airway & Oxygen Therapy: Patient Spontanous Breathing ? ?Post-op Assessment: Report given to RN ? ?Post vital signs: Reviewed and stable ? ?Last Vitals:  ?Vitals Value Taken Time  ?BP    ?Temp    ?Pulse    ?Resp    ?SpO2    ? ? ?Last Pain:  ?Vitals:  ? 11/05/21 0821  ?TempSrc:   ?PainSc: 0-No pain  ?   ? ?  ? ?Complications: No notable events documented. ?

## 2021-11-06 LAB — SURGICAL PATHOLOGY

## 2021-11-07 ENCOUNTER — Encounter: Payer: Self-pay | Admitting: Internal Medicine

## 2021-11-09 ENCOUNTER — Encounter (HOSPITAL_COMMUNITY): Payer: Self-pay | Admitting: Internal Medicine

## 2021-11-16 ENCOUNTER — Ambulatory Visit (INDEPENDENT_AMBULATORY_CARE_PROVIDER_SITE_OTHER): Payer: Medicare HMO | Admitting: Family Medicine

## 2021-11-16 ENCOUNTER — Ambulatory Visit (HOSPITAL_COMMUNITY)
Admission: RE | Admit: 2021-11-16 | Discharge: 2021-11-16 | Disposition: A | Discharge status: Home / Self Care | Payer: Medicare HMO | Source: Ambulatory Visit | Attending: Family Medicine | Admitting: Family Medicine

## 2021-11-16 VITALS — BP 140/80 | HR 62 | Temp 97.7°F | Ht 64.0 in | Wt 203.8 lb

## 2021-11-16 DIAGNOSIS — R351 Nocturia: Secondary | ICD-10-CM | POA: Diagnosis not present

## 2021-11-16 DIAGNOSIS — N401 Enlarged prostate with lower urinary tract symptoms: Secondary | ICD-10-CM | POA: Diagnosis not present

## 2021-11-16 DIAGNOSIS — R1030 Lower abdominal pain, unspecified: Secondary | ICD-10-CM | POA: Insufficient documentation

## 2021-11-16 LAB — POCT URINALYSIS DIPSTICK
Bilirubin, UA: NEGATIVE
Blood, UA: NEGATIVE
Glucose, UA: NEGATIVE
Ketones, UA: NEGATIVE
Leukocytes, UA: NEGATIVE
Nitrite, UA: NEGATIVE
Protein, UA: POSITIVE — AB
Spec Grav, UA: >=1.03 — AB (ref 1.010–1.025)
Urobilinogen, UA: NEGATIVE E.U./dL — AB
pH, UA: 6.5 (ref 5.0–8.0)

## 2021-11-16 NOTE — Progress Notes (Signed)
? ?Subjective:  ?Patient ID: Bradley [name], male    DOB: Sep 21, 1945  Age: 76 y.o. MRN: [id number] ? ?CC: ?Chief Complaint  ?Patient presents with  ? prostate issues  ?  Having a lot of pain  ? Medication Refill  ?  Lasix  ? ? ?HPI: ? ?76 year old male presents for evaluation of the above. ? ?Patient has underlying BPH.  He reports a 2 to 3-week history of low back pain and intermittent lower abdominal pain.  He reports nocturia.  He is getting up 3-4 times a night to urinate.  He is on Flomax 0.4 mg twice daily.  No fever.  No reports of rectal pain.  No reports of hematuria.  No other reported urinary symptoms.  No reports of nausea or vomiting.  He does have occasional constipation.  Last bowel movement was soft and easy to pass. ? ?Patient Active Problem List  ? Diagnosis Date Noted  ? Lower abdominal pain 11/16/2021  ? Benign prostatic hyperplasia without urinary obstruction 08/11/2021  ? Vitamin D deficiency disease 08/11/2021  ? Patient under care of multiple providers 08/11/2021  ? 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 Post herpetic neuralgia 07/24/2019  ? Family history of colon cancer 06/01/2018  ? Benign prostatic hyperplasia with nocturia 02/22/2018  ? Obesity 10/27/2015  ? Pernicious anemia 01/30/2014  ? Personal history of colonic polyps 03/14/2013  ? HYPERLIPIDEMIA, MIXED 12/25/2008  ? GOUT 12/25/2008  ? ANXIETY 12/25/2008  ? OBSTRUCTIVE SLEEP APNEA 12/25/2008  ? HYPERTENSION, BENIGN ESSENTIAL 12/25/2008  ? GASTROESOPHAGEAL REFLUX DISEASE 12/25/2008  ? BARRETTS ESOPHAGUS 12/25/2008  ? ? ?Social Hx   ?Social History  ? ?Socioeconomic History  ? Marital status: Married  ?  Spouse name: Irean Hong  ? Number of children: 2  ? Years of education: 6  ? Highest education level: Not on file  ?Occupational History  ? Occupation: Retired  ?  Comment: [employer]  ?Tobacco Use  ? Smoking status: Never  ? Smokeless tobacco: Never  ?Substance and Sexual Activity  ? Alcohol use: No  ? Drug use: No  ?  Sexual activity: Yes  ?Other Topics Concern  ? Not on file  ?Social History Narrative  ? Lives with wife  ? 2 children, 1 passed away  ? caffeine- coffee 1-2 daily  ? ?Social Determinants of Health  ? ?Financial Resource Strain: Low Risk   ? Difficulty of Paying Living Expenses: Not very hard  ?Food Insecurity: No Food Insecurity  ? Worried About Charity fundraiser in the Last Year: Never true  ? Ran Out of Food in the Last Year: Never true  ?Transportation Needs: No Transportation Needs  ? Lack of Transportation (Medical): No  ? Lack of Transportation (Non-Medical): No  ?Physical Activity: Insufficiently Active  ? Days of Exercise per Week: 5 days  ? Minutes of Exercise per Session: 20 min  ?Stress: No Stress Concern Present  ? Feeling of Stress : Only a little  ?Social Connections: Socially Integrated  ? Frequency of Communication with Friends and Family: More than three times a week  ? Frequency of Social Gatherings with Friends and Family: More than three times a week  ? Attends Religious Services: More than 4 times per year  ? Active Member of Clubs or Organizations: Yes  ? Attends Archivist Meetings: More than 4 times per year  ? Marital Status: Married  ? ? ?Review of Systems ?Per HPI ? ?Objective:  ?BP 140/80  Pulse 62   Temp 97.7 ?F (36.5 ?C) (Oral)   Ht '5\' 4"'$  (1.626 m)   Wt 203 lb 12.8 oz (92.4 kg)   SpO2 97%   BMI 34.98 kg/m?  ? ? ?  11/16/2021  ?  1:08 PM 11/05/2021  ?  8:41 AM 11/05/2021  ?  7:56 AM  ?BP/Weight  ?Systolic BP 696 295 284  ?Diastolic BP 80 74 85  ?Wt. (Lbs) 203.8    ?BMI 34.98 kg/m2    ? ? ?Physical Exam ?Vitals and nursing note reviewed.  ?Constitutional:   ?   General: He is not in acute distress. ?   Appearance: Normal appearance. He is obese.  ?HENT:  ?   Head: Normocephalic and atraumatic.  ?Eyes:  ?   General:     ?   Right eye: No discharge.     ?   Left eye: No discharge.  ?   Conjunctiva/sclera: Conjunctivae normal.  ?Cardiovascular:  ?   Rate and Rhythm: Normal  rate and regular rhythm.  ?Pulmonary:  ?   Effort: Pulmonary effort is normal.  ?   Breath sounds: Normal breath sounds. No wheezing, rhonchi or rales.  ?Abdominal:  ?   General: There is no distension.  ?   Palpations: Abdomen is soft.  ?   Tenderness: There is no abdominal tenderness.  ?Genitourinary: ?   Prostate: Not tender.  ?Neurological:  ?   Mental Status: He is alert.  ? ? ?Lab Results  ?Component Value Date  ? WBC 6.7 08/27/2021  ? HGB 15.9 08/27/2021  ? HCT 48.2 08/27/2021  ? PLT 188 08/27/2021  ? GLUCOSE 91 08/27/2021  ? CHOL 113 08/27/2021  ? TRIG 99 08/27/2021  ? HDL 36 (L) 08/27/2021  ? [place] 58 08/27/2021  ? ALT 21 08/27/2021  ? AST 39 08/27/2021  ? NA 141 08/27/2021  ? K 4.8 08/27/2021  ? CL 104 08/27/2021  ? CREATININE 1.17 08/27/2021  ? BUN 14 08/27/2021  ? CO2 25 08/27/2021  ? TSH 1.706 10/22/2020  ? HGBA1C 5.6 07/23/2016  ? ? ? ?Assessment & Plan:  ? ?Problem List Items Addressed This Visit   ? ?  ? Other  ? Benign prostatic hyperplasia with nocturia - Primary  ? Relevant Orders  ? POCT urinalysis dipstick (Completed)  ? Lower abdominal pain  ?  KUB was obtained and was negative.  Abdominal exam benign.  Supportive care. ? ?  ?  ? Relevant Orders  ? DG Abd 1 View (Completed)  ? ?Thersa Salt DO ?[place] ? ?

## 2021-11-16 NOTE — Assessment & Plan Note (Signed)
KUB was obtained and was negative.  Abdominal exam benign.  Supportive care. ?

## 2021-11-16 NOTE — Patient Instructions (Signed)
Prostate exam unremarkable.  ? ?Increase water intake. ? ?Xray at the hospital. ? ?We will call with results. ? ?Take care ? ?Dr. Lacinda Axon  ?

## 2021-11-16 NOTE — Assessment & Plan Note (Signed)
Urinalysis without evidence of infection.  Prostate exam did not reveal bogginess or tenderness.  I do not think that he is experiencing prostatitis.  Advised to increase fluid intake and continue Flomax. ?

## 2022-01-23 ENCOUNTER — Other Ambulatory Visit: Payer: Self-pay

## 2022-01-23 ENCOUNTER — Emergency Department (HOSPITAL_COMMUNITY): Payer: Medicare HMO

## 2022-01-23 ENCOUNTER — Encounter (HOSPITAL_COMMUNITY): Payer: Self-pay | Admitting: *Deleted

## 2022-01-23 ENCOUNTER — Emergency Department (HOSPITAL_COMMUNITY)
Admission: EM | Admit: 2022-01-23 | Discharge: 2022-01-23 | Disposition: A | Discharge status: Home / Self Care | Payer: Medicare HMO | Attending: Emergency Medicine | Admitting: Emergency Medicine

## 2022-01-23 DIAGNOSIS — Z7982 Long term (current) use of aspirin: Secondary | ICD-10-CM | POA: Insufficient documentation

## 2022-01-23 DIAGNOSIS — Z79899 Other long term (current) drug therapy: Secondary | ICD-10-CM | POA: Diagnosis not present

## 2022-01-23 DIAGNOSIS — N200 Calculus of kidney: Secondary | ICD-10-CM | POA: Diagnosis not present

## 2022-01-23 DIAGNOSIS — M545 Low back pain, unspecified: Secondary | ICD-10-CM | POA: Diagnosis present

## 2022-01-23 LAB — COMPREHENSIVE METABOLIC PANEL
ALT: 21 U/L (ref 0–44)
AST: 30 U/L (ref 15–41)
Albumin: 3.8 g/dL (ref 3.5–5.0)
Alkaline Phosphatase: 60 U/L (ref 38–126)
Anion gap: 4 — ABNORMAL LOW (ref 5–15)
BUN: 22 mg/dL (ref 8–23)
CO2: 26 mmol/L (ref 22–32)
Calcium: 8.6 mg/dL — ABNORMAL LOW (ref 8.9–10.3)
Chloride: 108 mmol/L (ref 98–111)
Creatinine, Ser: 1.19 mg/dL (ref 0.61–1.24)
GFR, Estimated: >60 mL/min (ref >60)
Glucose, Bld: 115 mg/dL — ABNORMAL HIGH (ref 70–99)
Potassium: 3.8 mmol/L (ref 3.5–5.1)
Sodium: 138 mmol/L (ref 135–145)
Total Bilirubin: 0.6 mg/dL (ref 0.3–1.2)
Total Protein: 6.1 g/dL — ABNORMAL LOW (ref 6.5–8.1)

## 2022-01-23 LAB — CBC WITH DIFFERENTIAL/PLATELET
Abs Immature Granulocytes: 0.01 10*3/uL (ref 0.00–0.07)
Basophils Absolute: 0 10*3/uL (ref 0.0–0.1)
Basophils Relative: 0 %
Eosinophils Absolute: 0.1 10*3/uL (ref 0.0–0.5)
Eosinophils Relative: 2 %
HCT: 43.5 % (ref 39.0–52.0)
Hemoglobin: 14.8 g/dL (ref 13.0–17.0)
Immature Granulocytes: 0 %
Lymphocytes Relative: 29 %
Lymphs Abs: 2.2 10*3/uL (ref 0.7–4.0)
MCH: 31.6 pg (ref 26.0–34.0)
MCHC: 34 g/dL (ref 30.0–36.0)
MCV: 92.8 fL (ref 80.0–100.0)
Monocytes Absolute: 0.5 10*3/uL (ref 0.1–1.0)
Monocytes Relative: 7 %
Neutro Abs: 4.8 10*3/uL (ref 1.7–7.7)
Neutrophils Relative %: 62 %
Platelets: 171 10*3/uL (ref 150–400)
RBC: 4.69 MIL/uL (ref 4.22–5.81)
RDW: 13.9 % (ref 11.5–15.5)
WBC: 7.5 10*3/uL (ref 4.0–10.5)
nRBC: 0 % (ref 0.0–0.2)

## 2022-01-23 LAB — URINALYSIS, ROUTINE W REFLEX MICROSCOPIC
Bacteria, UA: NONE SEEN
Bilirubin Urine: NEGATIVE
Glucose, UA: NEGATIVE mg/dL
Hgb urine dipstick: NEGATIVE
Ketones, ur: NEGATIVE mg/dL
Nitrite: NEGATIVE
Protein, ur: NEGATIVE mg/dL
Specific Gravity, Urine: 1.021 (ref 1.005–1.030)
pH: 6 (ref 5.0–8.0)

## 2022-01-23 LAB — LIPASE, BLOOD: Lipase: 26 U/L (ref 11–51)

## 2022-01-23 MED ORDER — KETOROLAC TROMETHAMINE 15 MG/ML IJ SOLN
15.0000 mg | Freq: Once | INTRAMUSCULAR | Status: AC
  Administered 2022-01-23: 15 mg via INTRAMUSCULAR
  Filled 2022-01-23: qty 1

## 2022-01-23 NOTE — ED Triage Notes (Signed)
Pt with lower back pain and abd lower pain, after getting up and walking, pain will ease some per pt. Hx of kidney stone.  Denies blood in urine or burning but has a hard time getting urine to start.

## 2022-01-23 NOTE — ED Provider Notes (Signed)
[facility] Provider Note   CSN: [id number] Arrival date & time: 01/23/22  1952     History  Chief Complaint  Patient presents with   Back Pain    Bradley [name] is a 76 y.o. male.  HPI Patient presents with his wife who assists with the history.  He presents with concern for 3 days of intermittent lower abdominal pain, essentially symmetric, no possibly worse on the right, inguinal region as well as low back pain with difficulty with urination.  No fever, chills, nausea, vomiting.  He is on Flomax has some urinary difficulty at baseline.     Home Medications Prior to Admission medications   Medication Sig Start Date End Date Taking? Authorizing Provider  allopurinol (ZYLOPRIM) 100 MG tablet Take 100 mg by mouth daily after breakfast.     [provider]  ALPRAZolam (XANAX) 1 MG tablet Take 1 mg by mouth 2 (two) times daily.    [provider]  aspirin EC 81 MG tablet Take 81 mg by mouth at bedtime.     [provider]  atorvastatin (LIPITOR) 40 MG tablet Take 40 mg by mouth every evening.     [provider]  carbamazepine (TEGRETOL) 200 MG tablet Take 200 mg by mouth 2 (two) times daily as needed (neuralgia).    [provider]  cyanocobalamin (,VITAMIN B-12,) 1000 MCG/ML injection Inject 1,000 mcg into the muscle every 30 (thirty) days.    [provider]  diphenhydrAMINE (BENADRYL) 25 MG tablet Take 25 mg by mouth every 6 (six) hours as needed for allergies or sleep.     [provider]  furosemide (LASIX) 20 MG tablet Take 20 mg by mouth daily as needed for fluid or edema.    [provider]  gabapentin (NEURONTIN) 300 MG capsule Take 3 capsules (900 mg total) by mouth 2 (two) times daily. Patient taking differently: Take 300 mg by mouth 3 (three) times daily. 07/31/19   Penumalli, Earlean Polka, MD  lisinopril (ZESTRIL) 40 MG tablet Take 40 mg by mouth daily as needed (high blood  pressure).    [provider]  naproxen sodium (ANAPROX) 275 MG tablet Take 275 mg by mouth 2 (two) times daily as needed for moderate pain.    [provider]  omeprazole (PRILOSEC) 20 MG capsule Take 20 mg by mouth 2 (two) times daily before a meal.    [provider]  pyridostigmine (MESTINON) 60 MG tablet Take 1 tablet (60 mg total) by mouth 2 (two) times daily. Patient taking differently: Take 60 mg by mouth 3 (three) times daily. 07/24/19   Penumalli, Earlean Polka, MD  SODIUM FLUORIDE 5000 PPM 1.1 % PSTE Place 1 application. onto teeth at bedtime. 09/22/20   [provider]  tamsulosin (FLOMAX) 0.4 MG CAPS capsule 2 qhs for BPH Patient taking differently: Take 0.8 mg by mouth at bedtime. 2 qhs for BPH 08/27/21   Kathyrn Drown, MD      Allergies    Patient has no known allergies.    Review of Systems   Review of Systems  All other systems reviewed and are negative.   Physical Exam Updated Vital Signs BP (!) 150/77 (BP Location: Right Arm)   Pulse 62   Temp 97.9 F (36.6 C) (Oral)   Resp 15   Ht '5\' 4"'$  (1.626 m)   Wt 90.7 kg   SpO2 99%   BMI 34.33 kg/m  Physical Exam Vitals and nursing  note reviewed.  Constitutional:      General: He is not in acute distress.    Appearance: He is well-developed.  HENT:     Head: Normocephalic and atraumatic.  Eyes:     Conjunctiva/sclera: Conjunctivae normal.  Cardiovascular:     Rate and Rhythm: Normal rate and regular rhythm.  Pulmonary:     Effort: Pulmonary effort is normal. No respiratory distress.     Breath sounds: No stridor.  Abdominal:     General: There is no distension.     Tenderness: There is abdominal tenderness.  Skin:    General: Skin is warm and dry.  Neurological:     Mental Status: He is alert and oriented to person, place, and time.     ED Results / Procedures / Treatments   Labs (all labs ordered are listed, but only abnormal results are displayed) Labs Reviewed   COMPREHENSIVE METABOLIC PANEL - Abnormal; Notable for the following components:      Result Value   Glucose, Bld 115 (*)    Calcium 8.6 (*)    Total Protein 6.1 (*)    Anion gap 4 (*)    All other components within normal limits  URINALYSIS, ROUTINE W REFLEX MICROSCOPIC - Abnormal; Notable for the following components:   APPearance HAZY (*)    Leukocytes,Ua TRACE (*)    All other components within normal limits  LIPASE, BLOOD  CBC WITH DIFFERENTIAL/PLATELET    EKG None  Radiology CT RENAL STONE STUDY  Result Date: 01/23/2022 CLINICAL DATA:  Lower back pain and flank pain. EXAM: CT ABDOMEN AND PELVIS WITHOUT CONTRAST TECHNIQUE: Multidetector CT imaging of the abdomen and pelvis was performed following the standard protocol without IV contrast. RADIATION DOSE REDUCTION: This exam was performed according to the departmental dose-optimization program which includes automated exposure control, adjustment of the mA and/or kV according to patient size and/or use of iterative reconstruction technique. COMPARISON:  CT abdomen and pelvis 03/12/2021 FINDINGS: Lower chest: No acute abnormality. Hepatobiliary: No focal liver abnormality is seen. No gallstones, gallbladder wall thickening, or biliary dilatation. Pancreas: Unremarkable. No pancreatic ductal dilatation or surrounding inflammatory changes. Spleen: Normal in size without focal abnormality. Adrenals/Urinary Tract: There is a 4 mm calculus in the distal left ureter image 2/74. There is no hydronephrosis. No additional urinary tract calculi are seen. Bladder and adrenal glands are within normal limits. There is a cyst in the inferior pole the left kidney measuring 3.5 cm. Stomach/Bowel: Stomach is within normal limits. Appendix appears normal. No evidence of bowel wall thickening, distention, or inflammatory changes. There is sigmoid colon diverticulosis. Vascular/Lymphatic: There are atherosclerotic calcifications of the aorta. Mild stranding  surrounding the abdominal aorta, decreased from prior. There are atherosclerotic calcifications of the aorta. No discrete enlarged lymph nodes are identified. Reproductive: Enlarged, unchanged. Other: No ascites.  Small fat containing umbilical hernias. Musculoskeletal: Multilevel degenerative changes affect the spine IMPRESSION: 1. 4 mm calculus in the distal left ureter.  No hydronephrosis. 2. Stranding surrounding the aorta has decreased. Findings may represent vasculitis or retroperitoneal fibrosis. No enlarged lymph nodes. 3. Colonic diverticulosis without evidence for diverticulitis. 4.  Aortic Atherosclerosis (ICD10-I70.0). Electronically Signed   By: Ronney Asters M.D.   On: 01/23/2022 22:01    Procedures Procedures    Medications Ordered in ED Medications  ketorolac (TORADOL) 15 MG/ML injection 15 mg (has no administration in time range)    ED Course/ Medical Decision Making/ A&P This patient with a Hx of prostate enlargement, hypertension  presents to the ED for concern of abdominal pain, difficulty with urinary, this involves an extensive number of treatment options, and is a complaint that carries with it a high risk of complications and morbidity.    The differential diagnosis includes cystitis, prostatitis, kidney stones, other abdominal processes   Social Determinants of Health:  Age  Additional history obtained:  Additional history and/or information obtained from wife, notable for details in HPI above   After the initial evaluation, orders, including: Labs CT were initiated.   Patient placed on Cardiac and Pulse-Oximetry Monitors. The patient was maintained on a cardiac monitor.  The cardiac monitored showed an rhythm of 60 sinus normal The patient was also maintained on pulse oximetry. The readings were typically 100% room air normal   On repeat evaluation of the patient improved  Lab Tests:  I personally interpreted labs.  The pertinent results include: No  anion gap, hematuria, no infection  Imaging Studies ordered:  I independently visualized and interpreted imaging which showed 4 mm kidney stone I agree with the radiologist interpretation  11:17 PM Patient and wife aware of all findings Dispostion / Final MDM:  After consideration of the diagnostic results and the patient's response to treatment, patient is awake, alert, with a nonperitoneal abdomen, no evidence for hemodynamic instability, no evidence for obstruction or infection.  Patient amenable to outpatient follow-up for his newly diagnosed kidney stone.  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  Final Clinical Impression(s) / ED Diagnoses Final diagnoses:  Nephrolithiasis    Rx / DC Orders ED Discharge Orders     None         Carmin Muskrat, MD 01/23/22 2317

## 2022-01-23 NOTE — Discharge Instructions (Signed)
Please stay well-hydrated, and take ibuprofen, 400 mg, 3 times daily for the next 3 days.  Take this medication with food.  Return here for concerning changes in your condition.

## 2022-01-26 ENCOUNTER — Encounter: Payer: Self-pay | Admitting: Nurse Practitioner

## 2022-01-26 ENCOUNTER — Telehealth: Payer: Self-pay | Admitting: Family Medicine

## 2022-01-26 ENCOUNTER — Other Ambulatory Visit: Payer: Self-pay | Admitting: Family Medicine

## 2022-01-26 ENCOUNTER — Ambulatory Visit (INDEPENDENT_AMBULATORY_CARE_PROVIDER_SITE_OTHER): Payer: Medicare HMO | Admitting: Nurse Practitioner

## 2022-01-26 VITALS — BP 127/85 | HR 68 | Temp 97.5°F | Wt 203.8 lb

## 2022-01-26 DIAGNOSIS — N200 Calculus of kidney: Secondary | ICD-10-CM

## 2022-01-26 MED ORDER — HYDROCODONE-ACETAMINOPHEN 5-325 MG PO TABS: 1.0000 | ORAL_TABLET | ORAL | 0 refills | Status: DC | PRN

## 2022-01-26 NOTE — Telephone Encounter (Signed)
Wife called states they went to pick up prescription at [facility] in [place] and was told they didn't receive the prescription. HYDROcodone-acetaminophen (NORCO/VICODIN) 5-325 MG tablet [[id number]]    Order Details Dose: 1 tablet Route: Oral Frequency: Every 4 hours PRN for severe pain  Dispense Quantity: 18 tablet Refills: 0        Sig: Take 1 tablet by mouth every 4 (four) hours as needed for severe pain.       Start Date: 01/26/22 End Date: --  Written Date: 01/26/22 Expiration Date: 07/25/22  Earliest Fill Date: 01/26/22       Associated Diagnoses: Kidney stone [N20.0]  Original Order:  HYDROcodone-acetaminophen (NORCO/VICODIN) 5-325 MG per tablet [[id number]]  CB# [phone]

## 2022-01-26 NOTE — Telephone Encounter (Signed)
I reviewed the electronic record Docia Barrier did send this I recent it to [facility] in [place] Hopefully it should be there now if still having problems let us know He is also on alprazolam twice a day he should consider reducing the dose of his alprazolam whenever he takes the hydrocodone to lessen the risk of drowsiness Do not operate machinery if feeling drowsy or do not be

## 2022-01-26 NOTE — Progress Notes (Signed)
Subjective:    Patient ID: Bradley [name], male    DOB: 1946/06/16, 76 y.o.   MRN: [id number]  HPI Pt arrives with back pain and kidney stones. Pt went to ER on Saturday. CT renal study completed. Pt is also having back pain.  Patient states that he was given Toradol injection in the emergency room however was not helpful.  Patient is currently using ibuprofen which does not help with the pain.  Per CT renal ultrasound done in the emergency room on July 8 patient has a 4 mm calculus in the distal left ureter.  Patient also has a cyst on the inferior pole of the left kidney measuring 3.5 cm.  Patient states that this is his second kidney stone over the past few years.  Patient denies any nausea vomiting, chest pain, blood in urine, fever, body aches, chills.  Review of Systems  Musculoskeletal:  Positive for back pain.  All other systems reviewed and are negative.      Objective:   Physical Exam Vitals reviewed.  Constitutional:      General: He is not in acute distress.    Appearance: Normal appearance. He is normal weight. He is not ill-appearing, toxic-appearing or diaphoretic.  HENT:     Head: Normocephalic and atraumatic.  Cardiovascular:     Rate and Rhythm: Normal rate and regular rhythm.     Pulses: Normal pulses.     Heart sounds: Normal heart sounds. No murmur heard. Pulmonary:     Effort: Pulmonary effort is normal. No respiratory distress.     Breath sounds: Normal breath sounds. No wheezing.  Abdominal:     General: Abdomen is flat. Bowel sounds are normal. There is no distension.     Palpations: Abdomen is soft. There is no mass.     Tenderness: There is no abdominal tenderness. There is left CVA tenderness. There is no right CVA tenderness, guarding or rebound.     Hernia: No hernia is present.  Musculoskeletal:     Cervical back: Normal range of motion and neck supple. No rigidity or tenderness.     Comments: Grossly intact  Lymphadenopathy:      Cervical: No cervical adenopathy.  Skin:    General: Skin is warm.     Capillary Refill: Capillary refill takes less than 2 seconds.  Neurological:     Mental Status: He is alert.     Comments: Grossly intact  Psychiatric:        Mood and Affect: Mood normal.        Behavior: Behavior normal.           Assessment & Plan:   1. Kidney stone -Patient has recurrent kidney stones. -Would like for patient to be evaluated with urology and for possible pain management if Vicodin does not suffice. - Ambulatory referral to Urology -If kidney stone is passed prior to urology appointment may cancel urology appointment -Continue to use up to 800 mg ibuprofen over-the-counter.  Be sure to take medication on a full stomach. - HYDROcodone-acetaminophen (NORCO/VICODIN) 5-325 MG tablet; Take 1 tablet by mouth every 4 (four) hours as needed for severe pain.  Dispense: 18 tablet; Refill: 0 -Continue to drink plenty water.  Stay away from sodas possible and high calcium foods. -Return to clinic if you experience body aches, fever, chills or any changes to symptoms.    Note:  This document was prepared using Dragon voice recognition software and may include unintentional dictation errors. Note - This  record has been created using [employer].  Chart creation errors have been sought, but may not always  have been located. Such creation errors do not reflect on  the standard of medical care.

## 2022-01-26 NOTE — Patient Instructions (Signed)
60

## 2022-01-27 NOTE — Telephone Encounter (Signed)
Left message for patient to return the call for additional details and recommendations.   

## 2022-01-27 NOTE — Telephone Encounter (Signed)
Patient informed per drs recommendations,pt verbalizes understanding.

## 2022-01-28 IMAGING — CT CT RENAL STONE PROTOCOL
2 of 4 series · 15 of 46 positions shown, 17 images · non-contrast
Comparison: None.

CLINICAL DATA: Left flank pain.  Kidney stones suspected.

EXAM:
CT ABDOMEN AND PELVIS WITHOUT CONTRAST
TECHNIQUE: Multidetector CT imaging of the abdomen and pelvis was performed
following the standard protocol without IV contrast.

[Series 2: axial st · axial · 0.74mm/px · z∈[+1039,+1459]mm · 12 of 97 slices shown, 14 images]
[im 7/97  soft-tissue]
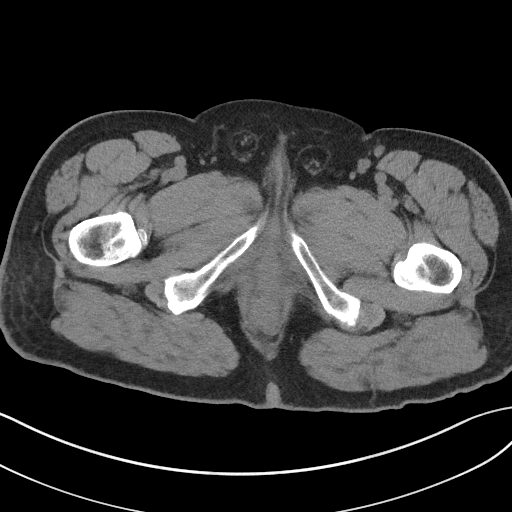
[im 7/97  bone]
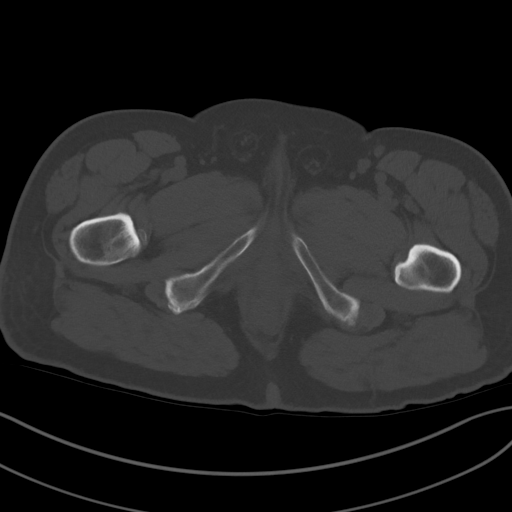
[im 13/97  soft-tissue]
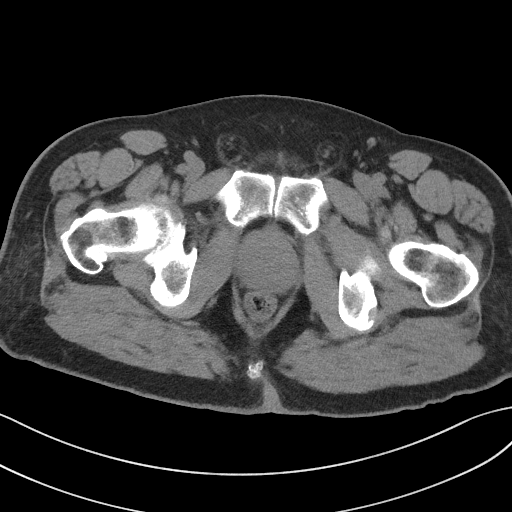
[im 25/97  soft-tissue]
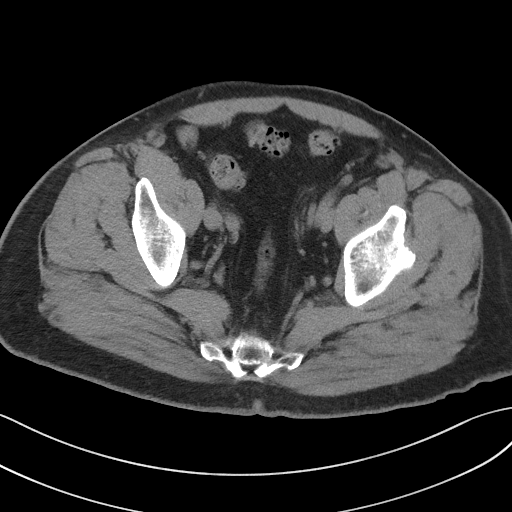
[im 31/97  soft-tissue]
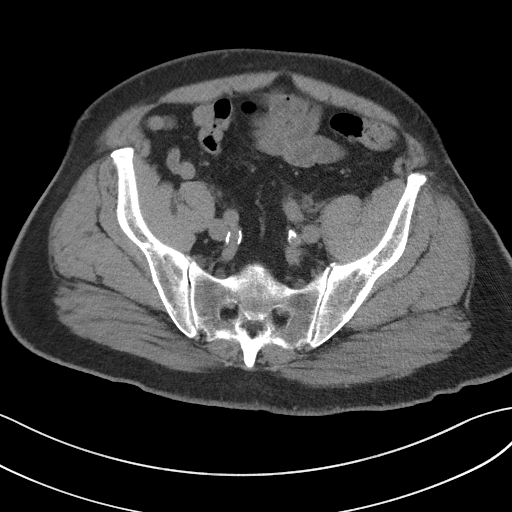
[im 37/97  soft-tissue]
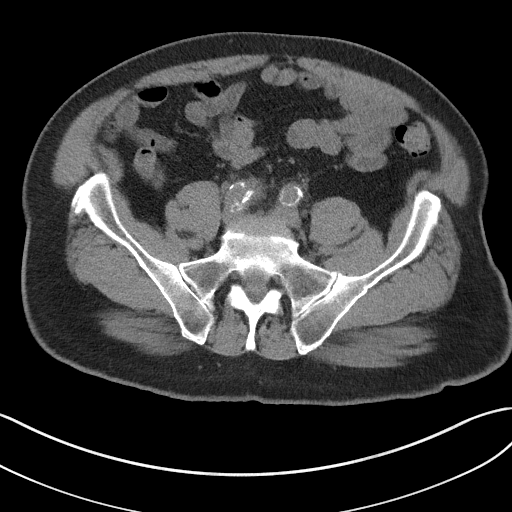
[im 43/97  soft-tissue]
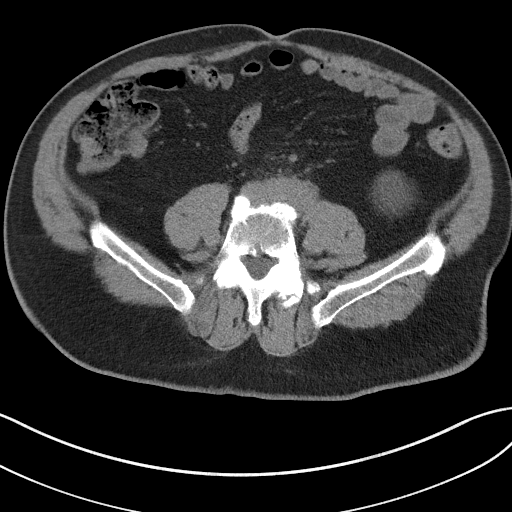
[im 55/97  soft-tissue]
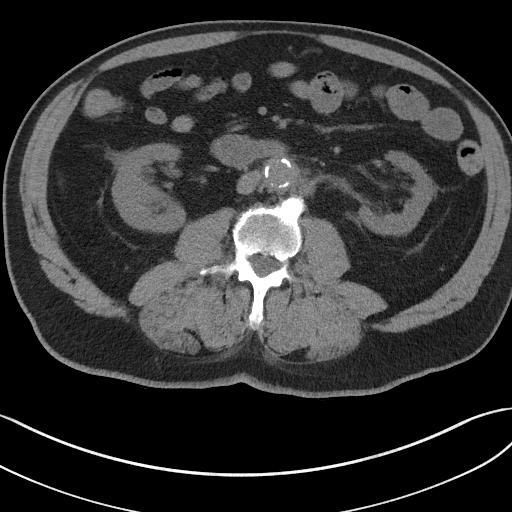
[im 61/97  soft-tissue]
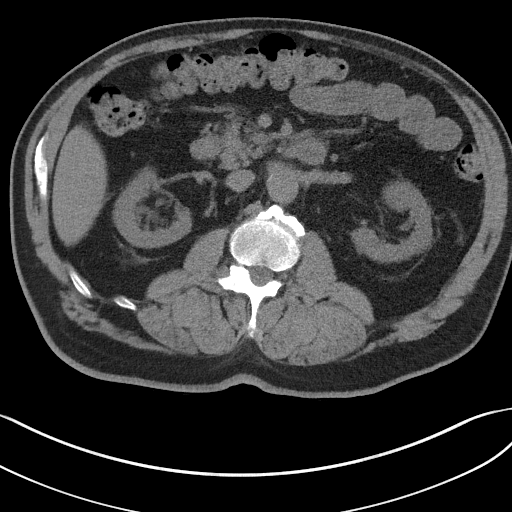
[im 67/97  soft-tissue]
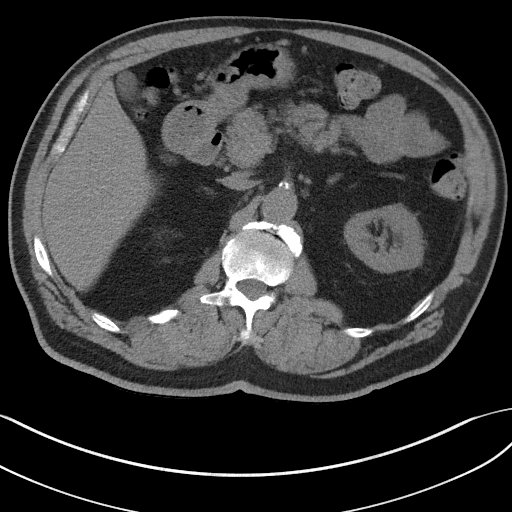
[im 67/97  bone]
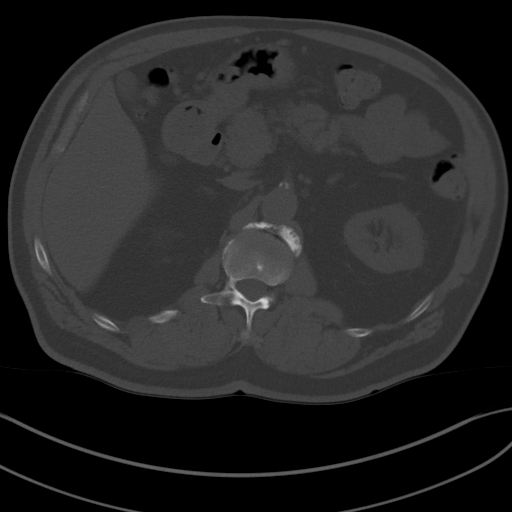
[im 73/97  soft-tissue]
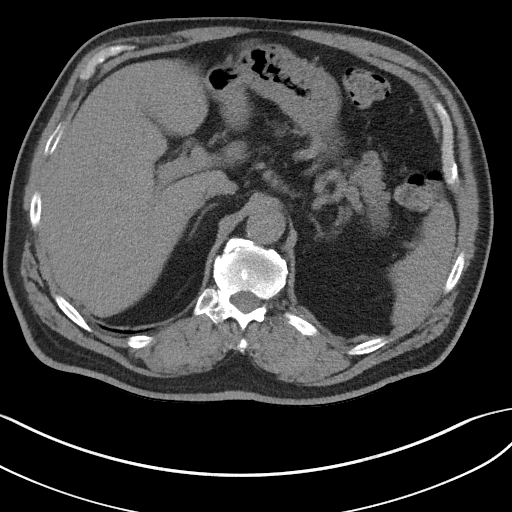
[im 85/97  soft-tissue]
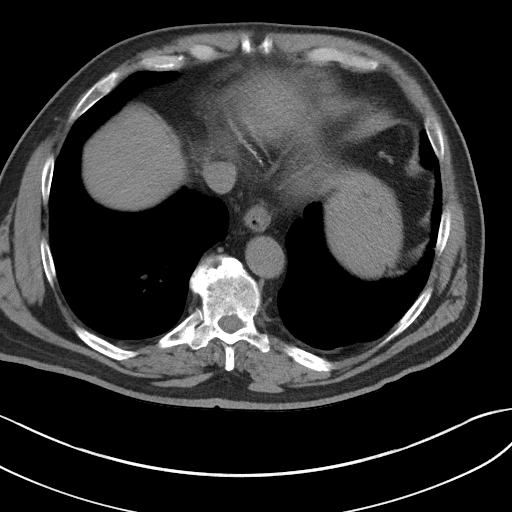
[im 91/97  soft-tissue]
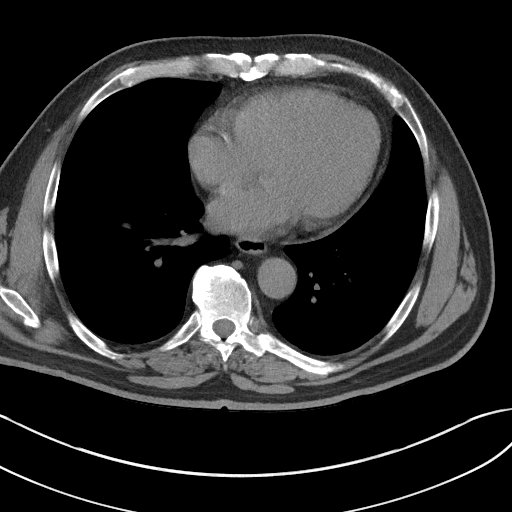

[Series 5: coronal st · coronal · 0.81mm/px · 3 of 96 slices shown]
[im 32/96  soft-tissue]
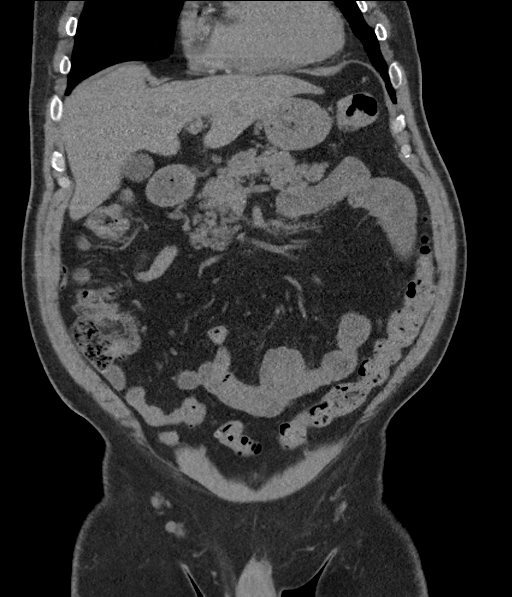
[im 43/96  soft-tissue]
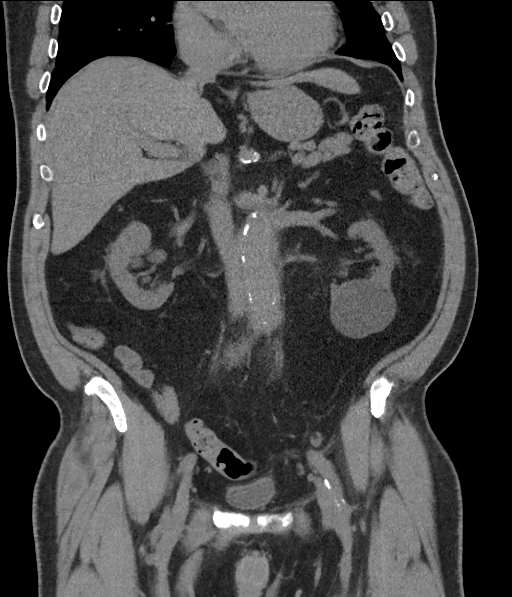
[im 53/96  soft-tissue]
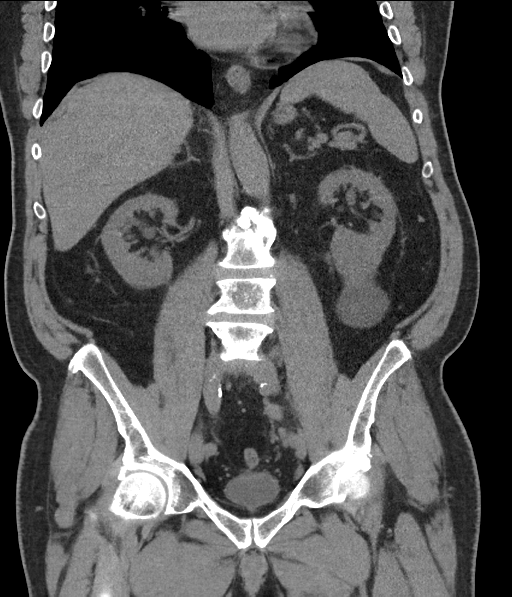

[15 of 46 positions shown; findings below may reference images not displayed]

FINDINGS: Lower chest: Unremarkable.

Hepatobiliary: No focal abnormality in the liver on this study
without intravenous contrast. There is no evidence for gallstones,
gallbladder wall thickening, or pericholecystic fluid. No
intrahepatic or extrahepatic biliary dilation.

Pancreas: No focal mass lesion. No dilatation of the main duct. No
intraparenchymal cyst. No peripancreatic edema.

Spleen: No splenomegaly. No focal mass lesion.

Adrenals/Urinary Tract: No adrenal nodule or mass. No stones are
seen in the right kidney or ureter. No secondary changes in the
right kidney or ureter. Probable right central sinus cyst.

5 mm nonobstructing stone identified lower pole moiety left kidney
with duplicated left intrarenal collecting system at least partial
duplication of the left ureter. 5.9 cm lesion lower pole left kidney
measures water density, compatible with a cyst. No left hydroureter
although there is some edema or inflammation in the region of the
distal left ureter.

The urinary bladder appears normal for the degree of distention.

Stomach/Bowel: Stomach is unremarkable. No gastric wall thickening.
No evidence of outlet obstruction. Duodenum is normally positioned
as is the ligament of Treitz. No small bowel wall thickening. No
small bowel dilatation. The terminal ileum is normal. The appendix
is normal. No gross colonic mass. No colonic wall thickening.

Vascular/Lymphatic: No abdominal aortic aneurysm. There is abnormal
soft tissue surrounding the infrarenal abdominal aorta (see image 40
of axial series 2). This extends down to the bifurcation (image
52/2) and along both common iliac arteries, right greater than left.
There is no gastrohepatic or hepatoduodenal ligament
lymphadenopathy. No retroperitoneal or mesenteric lymphadenopathy.
No pelvic sidewall lymphadenopathy.

Reproductive: Prostate gland is upper normal to mildly enlarged.

Other: No intraperitoneal free fluid.

Musculoskeletal: No worrisome lytic or sclerotic osseous
abnormality.
IMPRESSION: 1. Abnormal soft tissue surrounding the infrarenal abdominal aorta
and along both common iliac arteries, right greater than left.
Findings could be related to vasculitis, lymphoma or retroperitoneal
fibrosis. Repeat CT abdomen/pelvis with intravenous contrast
recommended to further evaluate.
2. 5 mm nonobstructing stone lower pole moiety left kidney with
duplicated left intrarenal collecting system and at least partial
duplication of the left ureter. There is some edema or inflammation
in the region of the distal left ureter. Imaging features are
nonspecific and may be related to recent stone passage or
infection/inflammation.
3. 5.9 cm lower pole left renal cyst.

## 2022-01-29 ENCOUNTER — Encounter: Payer: Self-pay | Admitting: Urology

## 2022-01-29 ENCOUNTER — Ambulatory Visit: Payer: Medicare HMO | Admitting: Urology

## 2022-01-29 VITALS — BP 148/88 | HR 64

## 2022-01-29 DIAGNOSIS — N201 Calculus of ureter: Secondary | ICD-10-CM

## 2022-01-29 DIAGNOSIS — N200 Calculus of kidney: Secondary | ICD-10-CM

## 2022-01-29 LAB — MICROSCOPIC EXAMINATION
RBC, Urine: NONE SEEN /hpf (ref 0–2)
Renal Epithel, UA: NONE SEEN /hpf

## 2022-01-29 LAB — URINALYSIS, ROUTINE W REFLEX MICROSCOPIC
Bilirubin, UA: NEGATIVE
Glucose, UA: NEGATIVE
Nitrite, UA: NEGATIVE
RBC, UA: NEGATIVE
Specific Gravity, UA: >1.03 — ABNORMAL HIGH (ref 1.005–1.030)
Urobilinogen, Ur: 0.2 mg/dL (ref 0.2–1.0)
pH, UA: 5.5 (ref 5.0–7.5)

## 2022-01-29 MED ORDER — OXYCODONE-ACETAMINOPHEN 5-325 MG PO TABS: 1.0000 | ORAL_TABLET | ORAL | 0 refills | Status: DC | PRN

## 2022-01-29 NOTE — Progress Notes (Signed)
01/29/2022 12:05 PM   Emilie Rutter 1945/09/24 [id number]  Referring provider: Claire Shown, NP [place],  [place] [address]  nephrolithiasis   HPI: Mr Alessio is a 76yo here for evaluation of left ureteral calculus. He was having abdominal pain in May 2023 and got a KUB which showed a left proximal ureteral calculus. He then developed severe left flank pain 01/23/2022 and presented to the ER. He was diagnosed with a 2m left distal ureteral calculus. He has not passed his calculus. He has mild left flank pain that is dull, intermittent mild and nonraditing. He has new urinary frequency, urinary urgency, and straining to urinate.  This is his 3rd stone event.    PMH: 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HCC)    Shingles    hx of, on back of neck   Sleep apnea    Noncompliant with CPAP   Trigeminal neuralgia     Surgical History: Past Surgical History:  Procedure Laterality Date   BIOPSY  08/02/2018   Procedure: BIOPSY;  Surgeon: RDaneil Dolin MD;  [facility];  Service: Endoscopy;;  gastric polyp   BIOPSY  11/05/2021   Procedure: BIOPSY;  Surgeon: RDaneil Dolin MD;  [facility];  Service: Endoscopy;;   COLONOSCOPY     COLONOSCOPY  12/09/2004   RDTO:IZTIWPYKDXpolyp at 35 cm biopsied/normal rectum   COLONOSCOPY   05/20/2008   RIPJ:ASNKNmidsigmoid s/p snare removal/normal rectum. Tubular adenoma   COLONOSCOPY N/A 08/02/2018   Procedure: COLONOSCOPY;  Surgeon: RDaneil Dolin MD;  [facility];  Service: Endoscopy;  Laterality: N/A;  1:15pm   COLONOSCOPY WITH ESOPHAGOGASTRODUODENOSCOPY (EGD) N/A 03/22/2013   Dr. RGala Romney Barrett's esophagus status post segmental biopsies with no evidence of dysplasia.  Normal colonoscopy.  Next  colonoscopy in 5 years.   ESOPHAGOGASTRODUODENOSCOPY  07/01/2003   RLZJ:QBHALPcolored epithelium involving essentially nearly the distal onehalf of the tubular esophagus/Patulous esophagogastric junction/ Moderate sized hiatal hernia/Otherwise normal stomach, normal D1 and D2.   ESOPHAGOGASTRODUODENOSCOPY  12/09/2004   RFXT:KWIOXB-DZHGDJMepithelium  beginning at 33 cm consistent with previous diagnosis of Barrett's esophagus  segmentally biopsied. Hiatal hernia. Otherwise normal stomach. Normal D1 and D2   ESOPHAGOGASTRODUODENOSCOPY   05/20/2008   REQA:STMHDQ-QIWLNLGepithelium distal esophagus consistent with Barrett's esophagus.  No significant change from prior exam. Multiple biopsies negative for dysplasia.   ESOPHAGOGASTRODUODENOSCOPY N/A 08/02/2018   Procedure: ESOPHAGOGASTRODUODENOSCOPY (EGD);  Surgeon: RDaneil Dolin MD;  [facility];  Service: Endoscopy;  Laterality: N/A;   ESOPHAGOGASTRODUODENOSCOPY (EGD) WITH PROPOFOL N/A 11/05/2021   Procedure: ESOPHAGOGASTRODUODENOSCOPY (EGD) WITH PROPOFOL;  Surgeon: RDaneil Dolin MD;  [facility];  Service: Endoscopy;  Laterality: N/A;  8:15am   POLYPECTOMY  08/02/2018   Procedure: POLYPECTOMY;  Surgeon: RDaneil Dolin MD;  [facility];  Service: Endoscopy;;  distal transverse colon(CSx1), rectal(CSx1)   TONSILLECTOMY      Home Medications:  Allergies as of 01/29/2022   No Known Allergies      Medication List        Accurate as of January 29, 2022 12:05 PM. If you have any questions, ask your nurse or doctor.  allopurinol 100 MG tablet Commonly known as: ZYLOPRIM Take 100 mg by mouth daily after breakfast.   ALPRAZolam 1 MG tablet Commonly known as: XANAX Take 1 mg by mouth 2 (two) times daily.   aspirin EC 81 MG tablet Take 81 mg by mouth at bedtime.   atorvastatin 40 MG tablet Commonly known as: LIPITOR Take 40 mg by mouth every evening.   carbamazepine 200 MG tablet Commonly  known as: TEGRETOL Take 200 mg by mouth 2 (two) times daily as needed (neuralgia).   cyanocobalamin 1000 MCG/ML injection Commonly known as: (VITAMIN B-12) Inject 1,000 mcg into the muscle every 30 (thirty) days.   diphenhydrAMINE 25 MG tablet Commonly known as: BENADRYL Take 25 mg by mouth every 6 (six) hours as needed for allergies or sleep.   furosemide 20 MG tablet Commonly known as: LASIX Take 20 mg by mouth daily as needed for fluid or edema.   gabapentin 300 MG capsule Commonly known as: NEURONTIN Take 3 capsules (900 mg total) by mouth 2 (two) times daily. What changed:  how much to take when to take this   HYDROcodone-acetaminophen 5-325 MG tablet Commonly known as: NORCO/VICODIN Take 1 tablet by mouth every 4 (four) hours as needed for severe pain.   lisinopril 40 MG tablet Commonly known as: ZESTRIL Take 40 mg by mouth daily as needed (high blood pressure).   naproxen sodium 275 MG tablet Commonly known as: ANAPROX Take 275 mg by mouth 2 (two) times daily as needed for moderate pain.   omeprazole 20 MG capsule Commonly known as: PRILOSEC Take 20 mg by mouth 2 (two) times daily before a meal.   pyridostigmine 60 MG tablet Commonly known as: MESTINON Take 1 tablet (60 mg total) by mouth 2 (two) times daily. What changed: when to take this   Sodium Fluoride 5000 PPM 1.1 % Pste Generic drug: Sodium Fluoride Place 1 application. onto teeth at bedtime.   tamsulosin 0.4 MG Caps capsule Commonly known as: FLOMAX 2 qhs for BPH What changed:  how much to take how to take this when to take this        Allergies: No Known Allergies  Family History: Family History  Problem Relation Age of Onset   Heart attack Father 62       First MI in 63s   Cancer Brother        prostate   Colon cancer Mother        age greater than 59s   Cancer Sister        brain   COPD Brother     Social History:  reports that he has never smoked. He has never used  smokeless tobacco. He reports that he does not drink alcohol and does not use drugs.  ROS: All other review of systems were reviewed and are negative except what is noted above in HPI  Physical Exam: BP (!) 148/88   Pulse 64   Constitutional:  Alert and oriented, No acute distress. HEENT: [place] AT, moist mucus membranes.  Trachea midline, no masses. Cardiovascular: No clubbing, cyanosis, or edema. Respiratory: Normal respiratory effort, no increased work of breathing. GI: Abdomen is soft, nontender, nondistended, no abdominal masses GU: No CVA tenderness.  Lymph: No cervical or inguinal lymphadenopathy. Skin: No rashes, bruises or suspicious lesions. Neurologic: Grossly intact, no focal deficits, moving all 4 extremities. Psychiatric: Normal mood and affect.  Laboratory Data: Lab Results  Component Value Date   WBC 7.5 01/23/2022   HGB 14.8  01/23/2022   HCT 43.5 01/23/2022   MCV 92.8 01/23/2022   PLT 171 01/23/2022    Lab Results  Component Value Date   CREATININE 1.19 01/23/2022    No results found for: "PSA"  No results found for: "TESTOSTERONE"  Lab Results  Component Value Date   HGBA1C 5.6 07/23/2016    Urinalysis    Component Value Date/Time   COLORURINE YELLOW 01/23/2022 2104   APPEARANCEUR HAZY (A) 01/23/2022 2104   LABSPEC 1.021 01/23/2022 2104   PHURINE 6.0 01/23/2022 2104   GLUCOSEU NEGATIVE 01/23/2022 2104   HGBUR NEGATIVE 01/23/2022 2104   BILIRUBINUR NEGATIVE 01/23/2022 2104   BILIRUBINUR Negative 11/16/2021 [place] 01/23/2022 2104   PROTEINUR NEGATIVE 01/23/2022 2104   UROBILINOGEN negative (A) 11/16/2021 1324   UROBILINOGEN 0.2 08/07/2012 1021   NITRITE NEGATIVE 01/23/2022 2104   LEUKOCYTESUR TRACE (A) 01/23/2022 2104    Lab Results  Component Value Date   BACTERIA NONE SEEN 01/23/2022    Pertinent Imaging: KUB 11/16/2021 and CT 01/23/2022: Images reviewed and discussed with the patient Results for orders placed in visit  on 11/16/21  DG Abd 1 View  Narrative CLINICAL DATA:  Lower abdominal pain. History kidney stones. Clinical constipation.  EXAM: ABDOMEN - 1 VIEW  COMPARISON:  03/12/2021  FINDINGS: Two supine views of the abdomen and pelvis. Nonobstructive bowel-gas pattern. No abnormal abdominal calcifications. No appendicolith. The low pelvis is excluded. No gross free intraperitoneal air. Mild convex left lumbar spine curvature.  IMPRESSION: No acute findings.   Electronically Signed By: Abigail Miyamoto M.D. On: 11/16/2021 14:23  No results found for this or any previous visit.  No results found for this or any previous visit.  No results found for this or any previous visit.  No results found for this or any previous visit.  No results found for this or any previous visit.  No results found for this or any previous visit.  Results for orders placed during the hospital encounter of 01/23/22  CT RENAL STONE STUDY  Narrative CLINICAL DATA:  Lower back pain and flank pain.  EXAM: CT ABDOMEN AND PELVIS WITHOUT CONTRAST  TECHNIQUE: Multidetector CT imaging of the abdomen and pelvis was performed following the standard protocol without IV contrast.  RADIATION DOSE REDUCTION: This exam was performed according to the departmental dose-optimization program which includes automated exposure control, adjustment of the mA and/or kV according to patient size and/or use of iterative reconstruction technique.  COMPARISON:  CT abdomen and pelvis 03/12/2021  FINDINGS: Lower chest: No acute abnormality.  Hepatobiliary: No focal liver abnormality is seen. No gallstones, gallbladder wall thickening, or biliary dilatation.  Pancreas: Unremarkable. No pancreatic ductal dilatation or surrounding inflammatory changes.  Spleen: Normal in size without focal abnormality.  Adrenals/Urinary Tract: There is a 4 mm calculus in the distal left ureter image 2/74. There is no hydronephrosis. No  additional urinary tract calculi are seen. Bladder and adrenal glands are within normal limits. There is a cyst in the inferior pole the left kidney measuring 3.5 cm.  Stomach/Bowel: Stomach is within normal limits. Appendix appears normal. No evidence of bowel wall thickening, distention, or inflammatory changes. There is sigmoid colon diverticulosis.  Vascular/Lymphatic: There are atherosclerotic calcifications of the aorta. Mild stranding surrounding the abdominal aorta, decreased from prior. There are atherosclerotic calcifications of the aorta. No discrete enlarged lymph nodes are identified.  Reproductive: Enlarged, unchanged.  Other: No ascites.  Small fat containing umbilical hernias.  Musculoskeletal: Multilevel degenerative changes affect  the spine  IMPRESSION: 1. 4 mm calculus in the distal left ureter.  No hydronephrosis. 2. Stranding surrounding the aorta has decreased. Findings may represent vasculitis or retroperitoneal fibrosis. No enlarged lymph nodes. 3. Colonic diverticulosis without evidence for diverticulitis. 4.  Aortic Atherosclerosis (ICD10-I70.0).   Electronically Signed By: Ronney Asters M.D. On: 01/23/2022 22:01   Assessment & Plan:    1. Kidney stones -We discussed the management of kidney stones. These options include observation, ureteroscopy, shockwave lithotripsy (ESWL) and percutaneous nephrolithotomy (PCNL). We discussed which options are relevant to the patient's stone(s). We discussed the natural history of kidney stones as well as the complications of untreated stones and the impact on quality of life without treatment as well as with each of the above listed treatments. We also discussed the efficacy of each treatment in its ability to clear the stone burden. With any of these management options I discussed the signs and symptoms of infection and the need for emergent treatment should these be experienced. For each option we discussed the  ability of each procedure to clear the patient of their stone burden.   For observation I described the risks which include but are not limited to silent renal damage, life-threatening infection, need for emergent surgery, failure to pass stone and pain.   For ureteroscopy I described the risks which include bleeding, infection, damage to contiguous structures, positioning injury, ureteral stricture, ureteral avulsion, ureteral injury, need for prolonged ureteral stent, inability to perform ureteroscopy, need for an interval procedure, inability to clear stone burden, stent discomfort/pain, heart attack, stroke, pulmonary embolus and the inherent risks with general anesthesia.   For shockwave lithotripsy I described the risks which include arrhythmia, kidney contusion, kidney hemorrhage, need for transfusion, pain, inability to adequately break up stone, inability to pass stone fragments, Steinstrasse, infection associated with obstructing stones, need for alternate surgical procedure, need for repeat shockwave lithotripsy, MI, CVA, PE and the inherent risks with anesthesia/conscious sedation.   For PCNL I described the risks including positioning injury, pneumothorax, hydrothorax, need for chest tube, inability to clear stone burden, renal laceration, arterial venous fistula or malformation, need for embolization of kidney, loss of kidney or renal function, need for repeat procedure, need for prolonged nephrostomy tube, ureteral avulsion, MI, CVA, PE and the inherent risks of general anesthesia.   - The patient would like to proceed with medical expulsive therapy. RX for percocet given. RTC 1 week wit KUB  - Urinalysis, Routine w reflex microscopic   No follow-ups on file.  [name], MD  [facility] [place]

## 2022-01-29 NOTE — Patient Instructions (Signed)
Dietary Guidelines to Help Prevent Kidney Stones Kidney stones are deposits of minerals and salts that form inside your kidneys. Your risk of developing kidney stones may be greater depending on your diet, your lifestyle, the medicines you take, and whether you have certain medical conditions. Most people can lower their chances of developing kidney stones by following the instructions below. Your dietitian may give you more specific instructions depending on your overall health and the type of kidney stones you tend to develop. What are tips for following this plan? Reading food labels  Choose foods with "no salt added" or "low-salt" labels. Limit your salt (sodium) intake to less than 1,500 mg a day. Choose foods with calcium for each meal and snack. Try to eat about 300 mg of calcium at each meal. Foods that contain 200-500 mg of calcium a serving include: 8 oz (237 mL) of milk, calcium-fortifiednon-dairy milk, and calcium-fortifiedfruit juice. Calcium-fortified means that calcium has been added to these drinks. 8 oz (237 mL) of kefir, yogurt, and soy yogurt. 4 oz (114 g) of tofu. 1 oz (28 g) of cheese. 1 cup (150 g) of dried figs. 1 cup (91 g) of cooked broccoli. One 3 oz (85 g) can of sardines or mackerel. Most people need 1,000-1,500 mg of calcium a day. Talk to your dietitian about how much calcium is recommended for you. Shopping Buy plenty of fresh fruits and vegetables. Most people do not need to avoid fruits and vegetables, even if these foods contain nutrients that may contribute to kidney stones. When shopping for convenience foods, choose: Whole pieces of fruit. Pre-made salads with dressing on the side. Low-fat fruit and yogurt smoothies. Avoid buying frozen meals or prepared deli foods. These can be high in sodium. Look for foods with live cultures, such as yogurt and kefir. Choose high-fiber grains, such as whole-wheat breads, oat bran, and wheat cereals. Cooking Do not add  salt to food when cooking. Place a salt shaker on the table and allow each person to add his or her own salt to taste. Use vegetable protein, such as beans, textured vegetable protein (TVP), or tofu, instead of meat in pasta, casseroles, and soups. Meal planning Eat less salt, if told by your dietitian. To do this: Avoid eating processed or pre-made food. Avoid eating fast food. Eat less animal protein, including cheese, meat, poultry, or fish, if told by your dietitian. To do this: Limit the number of times you have meat, poultry, fish, or cheese each week. Eat a diet free of meat at least 2 days a week. Eat only one serving each day of meat, poultry, fish, or seafood. When you prepare animal protein, cut pieces into small portion sizes. For most meat and fish, one serving is about the size of the palm of your hand. Eat at least five servings of fresh fruits and vegetables each day. To do this: Keep fruits and vegetables on hand for snacks. Eat one piece of fruit or a handful of berries with breakfast. Have a salad and fruit at lunch. Have two kinds of vegetables at dinner. Limit foods that are high in a substance called oxalate. These include: Spinach (cooked), rhubarb, beets, sweet potatoes, and Swiss chard. Peanuts. Potato chips, french fries, and baked potatoes with skin on. Nuts and nut products. Chocolate. If you regularly take a diuretic medicine, make sure to eat at least 1 or 2 servings of fruits or vegetables that are high in potassium each day. These include: Avocado. Banana. Orange, prune,   carrot, or tomato juice. Baked potato. Cabbage. Beans and split peas. Lifestyle  Drink enough fluid to keep your urine pale yellow. This is the most important thing you can do. Spread your fluid intake throughout the day. If you drink alcohol: Limit how much you use to: 0-1 drink a day for women who are not pregnant. 0-2 drinks a day for men. Be aware of how much alcohol is in your  drink. In the [place] one drink equals one 12 oz bottle of beer (355 mL), one 5 oz glass of wine (148 mL), or one 1 oz glass of hard liquor (44 mL). Lose weight if told by your health care provider. Work with your dietitian to find an eating plan and weight loss strategies that work best for you. General information Talk to your health care provider and dietitian about taking daily supplements. You may be told the following depending on your health and the cause of your kidney stones: Not to take supplements with vitamin C. To take a calcium supplement. To take a daily probiotic supplement. To take other supplements such as magnesium, fish oil, or vitamin B6. Take over-the-counter and prescription medicines only as told by your health care provider. These include supplements. What foods should I limit? Limit your intake of the following foods, or eat them as told by your dietitian. Vegetables Spinach. Rhubarb. Beets. Canned vegetables. Pickles. Olives. Baked potatoes with skin. Grains Wheat bran. Baked goods. Salted crackers. Cereals high in sugar. Meats and other proteins Nuts. Nut butters. Large portions of meat, poultry, or fish. Salted, precooked, or cured meats, such as sausages, meat loaves, and hot dogs. Dairy Cheese. Beverages Regular soft drinks. Regular vegetable juice. Seasonings and condiments Seasoning blends with salt. Salad dressings. Soy sauce. Ketchup. Barbecue sauce. Other foods Canned soups. Canned pasta sauce. Casseroles. Pizza. Lasagna. Frozen meals. Potato chips. French fries. The items listed above may not be a complete list of foods and beverages you should limit. Contact a dietitian for more information. What foods should I avoid? Talk to your dietitian about specific foods you should avoid based on the type of kidney stones you have and your overall health. Fruits Grapefruit. The item listed above may not be a complete list of foods and beverages you should  avoid. Contact a dietitian for more information. Summary Kidney stones are deposits of minerals and salts that form inside your kidneys. You can lower your risk of kidney stones by making changes to your diet. The most important thing you can do is drink enough fluid. Drink enough fluid to keep your urine pale yellow. Talk to your dietitian about how much calcium you should have each day, and eat less salt and animal protein as told by your dietitian. This information is not intended to replace advice given to you by your health care provider. Make sure you discuss any questions you have with your health care provider. Document Revised: 03/16/2021 Document Reviewed: 03/16/2021 Elsevier Patient Education  2023 Elsevier Inc.  

## 2022-02-08 ENCOUNTER — Ambulatory Visit: Payer: Medicare HMO | Admitting: Urology

## 2022-02-08 ENCOUNTER — Telehealth: Payer: Self-pay

## 2022-02-08 NOTE — Telephone Encounter (Signed)
Patient cancelled today's appointment. Wanting to let stone pass on it's own.  Thanks, Helene Kelp

## 2022-02-23 ENCOUNTER — Ambulatory Visit: Payer: Medicare HMO | Admitting: Family Medicine

## 2022-03-05 ENCOUNTER — Encounter: Payer: Self-pay | Admitting: Family Medicine

## 2022-03-05 ENCOUNTER — Ambulatory Visit (INDEPENDENT_AMBULATORY_CARE_PROVIDER_SITE_OTHER): Payer: Medicare HMO | Admitting: Family Medicine

## 2022-03-05 VITALS — BP 128/82 | HR 56 | Temp 97.8°F | Wt 208.2 lb

## 2022-03-05 DIAGNOSIS — L03317 Cellulitis of buttock: Secondary | ICD-10-CM

## 2022-03-05 DIAGNOSIS — W57XXXA Bitten or stung by nonvenomous insect and other nonvenomous arthropods, initial encounter: Secondary | ICD-10-CM

## 2022-03-05 MED ORDER — CEPHALEXIN 500 MG PO CAPS: 500.0000 mg | ORAL_CAPSULE | Freq: Three times a day (TID) | ORAL | 0 refills | Status: DC

## 2022-03-05 NOTE — Patient Instructions (Addendum)
Hi Yvone Neu  It was good to see you today I would recommend Benadryl in the evening time Also would recommend Keflex-cephalexin-500 mg 1 taken 3 times daily for the next 10 days If this resolves over the next week you can stop this I would recommend following up again in approximately 10 days Follow-up sooner if any problems or if you start running fevers or it appears to be getting significantly worse  TakeCare-Dr. Nicki Reaper

## 2022-03-05 NOTE — Progress Notes (Unsigned)
   Subjective:    Patient ID: Bradley [name], male    DOB: [birth year], 76 y.o.   MRN: [id number]  HPI Pt arrives with possible mass on left thigh/buttock area. Pt noticed yesterday afternoon. Pt wife states at first it was about 8-10 small bumps and now it is one solid mass. Changed overnight. Pt states it itched yesterday and is tender to touch.   Kidney stone settled in left side; one week ago left leg felt heavier than right leg.  He has an area that seems to be coalescing on the left side around the hip he wondered if it is related to kidney stone but then it started off itching then it got firm then it got a little red not much in the way of tenderness  Review of Systems     Objective:   Physical Exam  General-in no acute distress Eyes-no discharge Lungs-respiratory rate normal, CTA CV-no murmurs,RRR Extremities skin warm dry no edema Neuro grossly normal Behavior normal, alert       Assessment & Plan:  More likely a bug bite that is coalescing into some subcutaneous swelling does have some redness so we will cover for the possibility cellulitis recommend compresses several times per day follow-up within 10 days recheck find no evidence of DVT on the leg

## 2022-03-11 ENCOUNTER — Ambulatory Visit (HOSPITAL_COMMUNITY)
Admission: RE | Admit: 2022-03-11 | Discharge: 2022-03-11 | Disposition: A | Discharge status: Home / Self Care | Payer: Medicare HMO | Source: Ambulatory Visit | Attending: Hematology | Admitting: Hematology

## 2022-03-11 ENCOUNTER — Other Ambulatory Visit (HOSPITAL_COMMUNITY)
Admission: RE | Admit: 2022-03-11 | Discharge: 2022-03-11 | Disposition: A | Discharge status: Home / Self Care | Payer: Medicare HMO | Source: Ambulatory Visit | Attending: Hematology | Admitting: Hematology

## 2022-03-11 ENCOUNTER — Inpatient Hospital Stay: Payer: Medicare HMO | Attending: Hematology

## 2022-03-11 DIAGNOSIS — N135 Crossing vessel and stricture of ureter without hydronephrosis: Secondary | ICD-10-CM | POA: Diagnosis present

## 2022-03-11 DIAGNOSIS — I2510 Atherosclerotic heart disease of native coronary artery without angina pectoris: Secondary | ICD-10-CM | POA: Diagnosis not present

## 2022-03-11 DIAGNOSIS — N200 Calculus of kidney: Secondary | ICD-10-CM | POA: Insufficient documentation

## 2022-03-11 DIAGNOSIS — K219 Gastro-esophageal reflux disease without esophagitis: Secondary | ICD-10-CM | POA: Insufficient documentation

## 2022-03-11 DIAGNOSIS — I700 Atherosclerosis of aorta: Secondary | ICD-10-CM | POA: Insufficient documentation

## 2022-03-11 DIAGNOSIS — N402 Nodular prostate without lower urinary tract symptoms: Secondary | ICD-10-CM | POA: Diagnosis not present

## 2022-03-11 DIAGNOSIS — K5790 Diverticulosis of intestine, part unspecified, without perforation or abscess without bleeding: Secondary | ICD-10-CM | POA: Insufficient documentation

## 2022-03-11 DIAGNOSIS — I10 Essential (primary) hypertension: Secondary | ICD-10-CM | POA: Diagnosis not present

## 2022-03-11 DIAGNOSIS — K682 Retroperitoneal fibrosis: Secondary | ICD-10-CM

## 2022-03-11 DIAGNOSIS — N400 Enlarged prostate without lower urinary tract symptoms: Secondary | ICD-10-CM | POA: Insufficient documentation

## 2022-03-11 DIAGNOSIS — K2270 Barrett's esophagus without dysplasia: Secondary | ICD-10-CM | POA: Insufficient documentation

## 2022-03-11 LAB — COMPREHENSIVE METABOLIC PANEL
ALT: 31 U/L (ref 0–44)
AST: 40 U/L (ref 15–41)
Albumin: 3.9 g/dL (ref 3.5–5.0)
Alkaline Phosphatase: 60 U/L (ref 38–126)
Anion gap: 3 — ABNORMAL LOW (ref 5–15)
BUN: 18 mg/dL (ref 8–23)
CO2: 26 mmol/L (ref 22–32)
Calcium: 8.7 mg/dL — ABNORMAL LOW (ref 8.9–10.3)
Chloride: 107 mmol/L (ref 98–111)
Creatinine, Ser: 1.06 mg/dL (ref 0.61–1.24)
GFR, Estimated: >60 mL/min (ref >60)
Glucose, Bld: 102 mg/dL — ABNORMAL HIGH (ref 70–99)
Potassium: 4.3 mmol/L (ref 3.5–5.1)
Sodium: 136 mmol/L (ref 135–145)
Total Bilirubin: 0.4 mg/dL (ref 0.3–1.2)
Total Protein: 6.7 g/dL (ref 6.5–8.1)

## 2022-03-11 LAB — LACTATE DEHYDROGENASE: LDH: 137 U/L (ref 98–192)

## 2022-03-11 LAB — CBC WITH DIFFERENTIAL/PLATELET
Abs Immature Granulocytes: 0.03 10*3/uL (ref 0.00–0.07)
Basophils Absolute: 0 10*3/uL (ref 0.0–0.1)
Basophils Relative: 1 %
Eosinophils Absolute: 0.1 10*3/uL (ref 0.0–0.5)
Eosinophils Relative: 2 %
HCT: 45.5 % (ref 39.0–52.0)
Hemoglobin: 15.3 g/dL (ref 13.0–17.0)
Immature Granulocytes: 1 %
Lymphocytes Relative: 28 %
Lymphs Abs: 1.7 10*3/uL (ref 0.7–4.0)
MCH: 31 pg (ref 26.0–34.0)
MCHC: 33.6 g/dL (ref 30.0–36.0)
MCV: 92.1 fL (ref 80.0–100.0)
Monocytes Absolute: 0.4 10*3/uL (ref 0.1–1.0)
Monocytes Relative: 7 %
Neutro Abs: 3.9 10*3/uL (ref 1.7–7.7)
Neutrophils Relative %: 61 %
Platelets: 170 10*3/uL (ref 150–400)
RBC: 4.94 MIL/uL (ref 4.22–5.81)
RDW: 14.1 % (ref 11.5–15.5)
WBC: 6.2 10*3/uL (ref 4.0–10.5)
nRBC: 0 % (ref 0.0–0.2)

## 2022-03-11 MED ORDER — IOHEXOL 300 MG/ML  SOLN
100.0000 mL | Freq: Once | INTRAMUSCULAR | Status: AC | PRN
  Administered 2022-03-11: 100 mL via INTRAVENOUS

## 2022-03-12 LAB — CEA: CEA: 1.1 ng/mL (ref 0.0–4.7)

## 2022-03-15 ENCOUNTER — Encounter: Payer: Self-pay | Admitting: Family Medicine

## 2022-03-15 ENCOUNTER — Ambulatory Visit (INDEPENDENT_AMBULATORY_CARE_PROVIDER_SITE_OTHER): Payer: Medicare HMO | Admitting: Family Medicine

## 2022-03-15 DIAGNOSIS — N402 Nodular prostate without lower urinary tract symptoms: Secondary | ICD-10-CM

## 2022-03-15 DIAGNOSIS — R351 Nocturia: Secondary | ICD-10-CM

## 2022-03-15 DIAGNOSIS — E559 Vitamin D deficiency, unspecified: Secondary | ICD-10-CM | POA: Diagnosis not present

## 2022-03-15 DIAGNOSIS — N401 Enlarged prostate with lower urinary tract symptoms: Secondary | ICD-10-CM

## 2022-03-15 NOTE — Progress Notes (Signed)
   Subjective:    Patient ID: Bradley [name], male    DOB: 1945/09/23, 76 y.o.   MRN: [id number]  HPI Pt arrives for follow up on left leg issues. Pt had CT done on Thursday to check on kidney stone. Pt states his left leg has been swelling and previously had some areas break out on left leg. Area on left buttock looks better; area is not all the way gone. Pt finished Keflex.  Lab work was completed by Dr. Delton Coombes as well as CT scan patient was inquisitive of the results I did inform the patient we were not the specialist but he did need to follow-up with them to review the CAT scan There is a prostate nodule which has been present for him PSA in the past this looked good but nonetheless we are recommending repeating a PSA  Review of Systems     Objective:   Physical Exam Lungs clear heart regular pulse normal BP good area on the hip looks good       Assessment & Plan:  The area on the left buttock has almost totally resolved  1. Hypocalcemia Recent lab work from Dr. Delton Coombes shows low calcium.  Patient does eat ice cream and cheese.  Will work-up for other possibilities - Phosphorus - PSA - VITAMIN D 25 Hydroxy (Vit-D Deficiency, Fractures) - PTH, Intact and Calcium  2. Vitamin D deficiency Await lab work - Phosphorus - PSA - VITAMIN D 25 Hydroxy (Vit-D Deficiency, Fractures) - PTH, Intact and Calcium  3. Prostate nodule Repeat PSA earlier this year was normal if it is gone up significantly we will consult with urology  This area looks similar to what it has been before on CAT scan - Phosphorus - PSA - VITAMIN D 25 Hydroxy (Vit-D Deficiency, Fractures) - PTH, Intact and Calcium  4. Benign prostatic hyperplasia with nocturia Currently right now patient states he gets up once at night to urinate we did discuss how if he starts getting up multiple times per night or has poor urine flow he will need further intervention with urology - Phosphorus - PSA - VITAMIN D  25 Hydroxy (Vit-D Deficiency, Fractures) - PTH, Intact and Calcium

## 2022-03-16 LAB — PTH, INTACT AND CALCIUM
Calcium: 9.5 mg/dL (ref 8.6–10.2)
PTH: 35 pg/mL (ref 15–65)

## 2022-03-16 LAB — VITAMIN D 25 HYDROXY (VIT D DEFICIENCY, FRACTURES): Vit D, 25-Hydroxy: 23.8 ng/mL — ABNORMAL LOW (ref 30.0–100.0)

## 2022-03-16 LAB — PHOSPHORUS: Phosphorus: 3.3 mg/dL (ref 2.8–4.1)

## 2022-03-16 LAB — PSA: Prostate Specific Ag, Serum: 2.8 ng/mL (ref 0.0–4.0)

## 2022-03-18 ENCOUNTER — Inpatient Hospital Stay: Payer: Medicare HMO | Admitting: Hematology

## 2022-03-18 VITALS — BP 143/86 | HR 62 | Temp 98.3°F | Resp 18 | Wt 205.2 lb

## 2022-03-18 DIAGNOSIS — N135 Crossing vessel and stricture of ureter without hydronephrosis: Secondary | ICD-10-CM | POA: Diagnosis present

## 2022-03-18 DIAGNOSIS — Z7982 Long term (current) use of aspirin: Secondary | ICD-10-CM | POA: Insufficient documentation

## 2022-03-18 DIAGNOSIS — Z79899 Other long term (current) drug therapy: Secondary | ICD-10-CM | POA: Insufficient documentation

## 2022-03-18 MED ORDER — VITAMIN D (ERGOCALCIFEROL) 1.25 MG (50000 UNIT) PO CAPS: 50000.0000 [IU] | ORAL_CAPSULE | ORAL | 0 refills | Status: AC

## 2022-03-18 NOTE — Patient Instructions (Addendum)
[place]  Discharge Instructions  You were seen and examined today by Dr. Delton Coombes.  Dr. Delton Coombes discussed your most recent lab work which revealed that everything is normal except your Vitamin D is low.  Start taking over the counter Vitamin D once daily.  Follow-up as needed.    Thank you for choosing [facility] to provide your oncology and hematology care.   To afford each patient quality time with our provider, please arrive at least 15 minutes before your scheduled appointment time. You may need to reschedule your appointment if you arrive late (10 or more minutes). Arriving late affects you and other patients whose appointments are after yours.  Also, if you miss three or more appointments without notifying the office, you may be dismissed from the clinic at the provider's discretion.    Again, thank you for choosing [facility].  Our hope is that these requests will decrease the amount of time that you wait before being seen by our physicians.   If you have a lab appointment with the [facility] please come in thru the Main Entrance and check in at the main information desk.           _____________________________________________________________  Should you have questions after your visit to [facility], please contact our office at [phone] and follow the prompts.  Our office hours are 8:00 a.m. to 4:30 p.m. Monday - Thursday and 8:00 a.m. to 2:30 p.m. Friday.  Please note that voicemails left after 4:00 p.m. may not be returned until the following business day.  We are closed weekends and all major holidays.  You do have access to a nurse 24-7, just call the main number to the clinic [phone] and do not press any options, hold on the line and a nurse will answer the phone.    For prescription refill requests, have your pharmacy contact our office and allow 72 hours.

## 2022-03-18 NOTE — Progress Notes (Signed)
Bradley [name], [place] [address]   CLINIC:  Medical Oncology/Hematology  PCP:  Kathyrn Drown, MD [address] [place] / [place] [address] [phone]   REASON FOR VISIT:  Follow-up for retroperitoneal fibrosis  PRIOR THERAPY: none  NGS Results: not done  CURRENT THERAPY: surveillance  INTERVAL HISTORY:  [name], a 76 y.o. male, seen for follow-up of retroperitoneal fibrosis.  He was reportedly having some left leg swelling for which she is following up with Dr. Wolfgang Phoenix.  He has occasional pains from kidney stones.  Denies any fevers, night sweats or weight loss.  REVIEW OF SYSTEMS:  Review of Systems  Constitutional:  Negative for appetite change, fatigue (75%), fever and unexpected weight change.  Musculoskeletal:  Negative for back pain.  Neurological:  Positive for dizziness.  All other systems reviewed and are negative.   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 Tubular adenoma   COLONOSCOPY N/A 08/02/2018   Procedure: COLONOSCOPY;  Surgeon: Daneil Dolin, MD;  [facility];  Service: Endoscopy;   Laterality: N/A;  1:15pm   COLONOSCOPY WITH ESOPHAGOGASTRODUODENOSCOPY (EGD) N/A 03/22/2013   Dr. Gala Romney: Barrett's esophagus status post segmental biopsies with no evidence of dysplasia.  Normal colonoscopy.  Next colonoscopy in 5 years.   ESOPHAGOGASTRODUODENOSCOPY  07/01/2003   BEE:FEOFHQ colored epithelium involving essentially nearly the distal onehalf of the tubular esophagus/Patulous esophagogastric junction/ Moderate sized hiatal hernia/Otherwise normal stomach, normal D1 and D2.   ESOPHAGOGASTRODUODENOSCOPY  12/09/2004   RFX:[id number] epithelium  beginning at 33 cm consistent with previous diagnosis of Barrett's esophagus  segmentally biopsied. Hiatal hernia. Otherwise normal stomach. Normal D1 and D2   ESOPHAGOGASTRODUODENOSCOPY   05/20/2008   RAX:[id number] epithelium distal esophagus consistent with Barrett's esophagus.  No significant change from prior exam. Multiple biopsies negative for dysplasia.   ESOPHAGOGASTRODUODENOSCOPY N/A 08/02/2018   Procedure: ESOPHAGOGASTRODUODENOSCOPY (EGD);  Surgeon: Daneil Dolin, MD;  [facility];  Service: Endoscopy;  Laterality: N/A;   ESOPHAGOGASTRODUODENOSCOPY (EGD) WITH PROPOFOL N/A 11/05/2021   Procedure: ESOPHAGOGASTRODUODENOSCOPY (EGD) WITH PROPOFOL;  Surgeon: Daneil Dolin, MD;  [facility];  Service: Endoscopy;  Laterality: N/A;  8:15am   POLYPECTOMY  08/02/2018   Procedure: POLYPECTOMY;  Surgeon: Daneil Dolin, MD;  [facility];  Service: Endoscopy;;  distal transverse colon(CSx1), rectal(CSx1)   TONSILLECTOMY      SOCIAL HISTORY:  Social History   Socioeconomic History   Marital status: Married    Spouse name: Merchandiser, retail  Number of children: 2   Years of education: 14   Highest education level: Not on file  Occupational History   Occupation: Retired    Comment: Hotel manager  Tobacco Use   Smoking status: Never   Smokeless tobacco: Never  Substance and Sexual Activity    Alcohol use: No   Drug use: No   Sexual activity: Yes  Other Topics Concern   Not on file  Social History Narrative   Lives with wife   2 children, 1 passed away   caffeine- coffee 1-2 daily   Social Determinants of Health   Financial Resource Strain: Low Risk  (08/11/2021)   Overall Financial Resource Strain (CARDIA)    Difficulty of Paying Living Expenses: Not very hard  Food Insecurity: No Food Insecurity (08/11/2021)   Hunger Vital Sign    Worried About Running Out of Food in the Last Year: Never true    Ran Out of Food in the Last Year: Never true  Transportation Needs: No Transportation Needs (08/11/2021)   PRAPARE - Hydrologist (Medical): No    Lack of Transportation (Non-Medical): No  Physical Activity: Insufficiently Active (08/11/2021)   Exercise Vital Sign    Days of Exercise per Week: 5 days    Minutes of Exercise per Session: 20 min  Stress: No Stress Concern Present (08/11/2021)   [place]    Feeling of Stress : Only a little  Social Connections: Socially Integrated (08/11/2021)   Social Connection and Isolation Panel [NHANES]    Frequency of Communication with Friends and Family: More than three times a week    Frequency of Social Gatherings with Friends and Family: More than three times a week    Attends Religious Services: More than 4 times per year    Active Member of [employer] or Organizations: Yes    Attends Music therapist: More than 4 times per year    Marital Status: Married  Human resources officer Violence: Not At Risk (08/11/2021)   Humiliation, Afraid, Rape, and Kick questionnaire    Fear of Current or Ex-Partner: No    Emotionally Abused: No    Physically Abused: No    Sexually Abused: No    FAMILY HISTORY:  Family History  Problem Relation Age of Onset   Heart attack Father 11       First MI in 41s   Cancer Brother        prostate   Colon  cancer Mother        age greater than 75s   Cancer Sister        brain   COPD Brother     CURRENT MEDICATIONS:  Current Outpatient Medications  Medication Sig Dispense Refill   allopurinol (ZYLOPRIM) 100 MG tablet Take 100 mg by mouth daily after breakfast.      ALPRAZolam (XANAX) 1 MG tablet Take 1 mg by mouth 2 (two) times daily.     aspirin EC 81 MG tablet Take 81 mg by mouth at bedtime.      atorvastatin (LIPITOR) 40 MG tablet Take 40 mg by mouth every evening.      carbamazepine (TEGRETOL) 200 MG tablet Take 200 mg by mouth 2 (two) times daily as needed (neuralgia).     cyanocobalamin (,VITAMIN B-12,) 1000 MCG/ML injection Inject 1,000 mcg into the muscle every 30 (thirty) days.     diphenhydrAMINE (BENADRYL) 25 MG tablet Take 25  mg by mouth every 6 (six) hours as needed for allergies or sleep.      furosemide (LASIX) 20 MG tablet Take 20 mg by mouth daily as needed for fluid or edema.     gabapentin (NEURONTIN) 300 MG capsule Take 3 capsules (900 mg total) by mouth 2 (two) times daily. (Patient taking differently: Take 300 mg by mouth 3 (three) times daily.) 180 capsule 5   HYDROcodone-acetaminophen (NORCO/VICODIN) 5-325 MG tablet Take 1 tablet by mouth every 4 (four) hours as needed for severe pain. 18 tablet 0   lisinopril (ZESTRIL) 40 MG tablet Take 40 mg by mouth daily as needed (high blood pressure).     naproxen sodium (ANAPROX) 275 MG tablet Take 275 mg by mouth 2 (two) times daily as needed for moderate pain.     omeprazole (PRILOSEC) 20 MG capsule Take 20 mg by mouth 2 (two) times daily before a meal.     oxyCODONE-acetaminophen (PERCOCET) 5-325 MG tablet Take 1 tablet by mouth every 4 (four) hours as needed. 30 tablet 0   pyridostigmine (MESTINON) 60 MG tablet Take 1 tablet (60 mg total) by mouth 2 (two) times daily. (Patient taking differently: Take 60 mg by mouth 3 (three) times daily.) 270 tablet 4   SODIUM FLUORIDE 5000 PPM 1.1 % PSTE Place 1 application. onto teeth at  bedtime.     tamsulosin (FLOMAX) 0.4 MG CAPS capsule 2 qhs for BPH (Patient taking differently: Take 0.8 mg by mouth at bedtime. 2 qhs for BPH) 180 capsule 3   No current facility-administered medications for this visit.    ALLERGIES:  No Known Allergies  PHYSICAL EXAM:  Performance status (ECOG): 1 - Symptomatic but completely ambulatory  There were no vitals filed for this visit. Wt Readings from Last 3 Encounters:  03/18/22 205 lb 3.2 oz (93.1 kg)  03/15/22 205 lb 12.8 oz (93.4 kg)  03/05/22 208 lb 3.2 oz (94.4 kg)   Physical Exam Vitals reviewed.  Constitutional:      Appearance: Normal appearance.  Cardiovascular:     Rate and Rhythm: Regular rhythm.     Heart sounds: Normal heart sounds.  Pulmonary:     Breath sounds: Normal breath sounds.  Neurological:     Mental Status: He is alert.  Psychiatric:        Mood and Affect: Mood normal.        Behavior: Behavior normal.     LABORATORY DATA:  I have reviewed the labs as listed.     Latest Ref Rng & Units 03/11/2022   12:15 PM 01/23/2022    9:19 PM 08/27/2021   10:19 AM  CBC  WBC 4.0 - 10.5 K/uL 6.2  7.5  6.7   Hemoglobin 13.0 - 17.0 g/dL 15.3  14.8  15.9   Hematocrit 39.0 - 52.0 % 45.5  43.5  48.2   Platelets 150 - 400 K/uL 170  171  188       Latest Ref Rng & Units 03/15/2022    4:15 PM 03/11/2022   12:15 PM 01/23/2022    9:19 PM  CMP  Glucose 70 - 99 mg/dL  102  115   BUN 8 - 23 mg/dL  18  22   Creatinine 0.61 - 1.24 mg/dL  1.06  1.19   Sodium 135 - 145 mmol/L  136  138   Potassium 3.5 - 5.1 mmol/L  4.3  3.8   Chloride 98 - 111 mmol/L  107  108   CO2  22 - 32 mmol/L  26  26   Calcium 8.6 - 10.2 mg/dL 9.5  8.7  8.6   Total Protein 6.5 - 8.1 g/dL  6.7  6.1   Total Bilirubin 0.3 - 1.2 mg/dL  0.4  0.6   Alkaline Phos 38 - 126 U/L  60  60   AST 15 - 41 U/L  40  30   ALT 0 - 44 U/L  31  21     DIAGNOSTIC IMAGING:  I have independently reviewed the scans and discussed with the patient. CT Abdomen Pelvis W  Contrast  Result Date: 03/13/2022 CLINICAL DATA:  Follow-up retroperitoneal fibrosis EXAM: CT ABDOMEN AND PELVIS WITH CONTRAST TECHNIQUE: Multidetector CT imaging of the abdomen and pelvis was performed using the standard protocol following bolus administration of intravenous contrast. RADIATION DOSE REDUCTION: This exam was performed according to the departmental dose-optimization program which includes automated exposure control, adjustment of the mA and/or kV according to patient size and/or use of iterative reconstruction technique. CONTRAST:  132m OMNIPAQUE IOHEXOL 300 MG/ML SOLN additional oral enteric contrast COMPARISON:  01/23/2022, 03/12/2021 FINDINGS: Lower chest: No acute abnormality. Three-vessel coronary artery calcifications. Hepatobiliary: No solid liver abnormality is seen. No gallstones, gallbladder wall thickening, or biliary dilatation. Pancreas: Unremarkable. No pancreatic ductal dilatation or surrounding inflammatory changes. Spleen: Normal in size without significant abnormality. Adrenals/Urinary Tract: Adrenal glands are unremarkable. 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 Simple, benign inferior pole left renal cortical cyst. Multiple bilateral benign parapelvic renal cysts. No further follow-up or characterization is required. Thickening of the urinary bladder wall, likely related to chronic outlet obstruction. Stomach/Bowel: Stomach is within normal limits. Appendix appears normal. No evidence of bowel wall thickening, distention, or inflammatory changes. Sigmoid diverticula. Vascular/Lymphatic: Aortic atherosclerosis. No enlarged abdominal or pelvic lymph nodes. Reproductive: Mild prostatomegaly. Unchanged faintly hypervascular nodule within the right aspect of the prostate measuring 2.4 x 2.3 cm (series 2, image 85). Other: Small, fat containing bilateral inguinal hernias. No ascites. Unchanged retroperitoneal soft tissue thickening about the infrarenal abdominal aorta  and proximal common iliac arteries (series 2, image 42). Musculoskeletal: No acute or significant osseous findings. IMPRESSION: 1. Unchanged retroperitoneal soft tissue thickening about the infrarenal abdominal aorta and proximal common iliac arteries. 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 2. Mild prostatomegaly. Unchanged faintly hypervascular nodule within the right aspect of the prostate body measuring 2.4 x 2.3 cm. Correlate with PSA. This could be further evaluated by dedicated prostate MR if clinically indicated. 3. Thickening of the urinary bladder wall, likely related to chronic outlet obstruction. 4. Diverticulosis without evidence of acute diverticulitis. 5. Coronary artery disease. Aortic Atherosclerosis (ICD10-I70.0). Electronically Signed   By: ADelanna AhmadiM.D.   On: 03/13/2022 07:23     ASSESSMENT:  1.  Retroperitoneal fibrosis: -He was evaluated for UTI in the ER. -CT renal stone study on 09/03/2020 showed abnormal soft tissue surrounding the infrarenal abdominal aorta and along both common iliac arteries, right greater than left.  5 mm nonobstructing stone in the lower pole of the left kidney.  5.9 cm lower pole left renal cyst. -CTAP with contrast on 09/03/2020 shows abnormal soft tissue surrounding the aorta extending from the level of the renal arteries to the iliac bifurcation.  This extends to involve the proximal common iliac arteries. -Denied any fevers, night sweats.  35 pound weight loss/6 to 8 months, intentional.  He quit drinking sodas. -No personal or family history of connective tissue disorders. - SPEP on 10/15/2020 was normal.  Free light chain ratio normal.  PSA 2.8.  Immunofixation was normal. - Labs on 10/22/2020-LDH normal.  CRP and ESR normal.  TSH and thyroglobulin antibodies were negative.  ANA and rheumatoid factor was negative.  CEA was 1.2.  Immunoglobulin levels and IgG subclass 4 were normal.   2.  Social/family history: -He worked in the  [employer].  No exposure to chemicals.  No history of smoking. -Brother had metastatic prostate cancer.  Mother had stomach cancer.  Maternal cousin had esophageal cancer   PLAN:  1.  Retroperitoneal fibrosis: - He does not report any B symptoms. - Labs from 03/11/2022 shows normal LFTs and LDH.  CBC was grossly normal.  Creatinine is 1.06 and stable. - Vitamin D level done by Dr. Wolfgang Phoenix was low at 23.  I have recommended him to start taking vitamin D supplements. - Reviewed CTAP with contrast from 03/10/2022 which showed retroperitoneal soft tissue thickening about the infrarenal abdominal aorta and proximal common iliac arteries stable.  Other benign findings were discussed with the patient. - He did not have any progression of retroperitoneal fibrosis since February 2022. - I have recommended that he may follow-up with Dr. Wolfgang Phoenix and if any new symptoms he can always come back to [place].    Orders placed this encounter:  No orders of the defined types were placed in this encounter.    Derek Jack, MD [place] [phone]

## 2022-03-18 NOTE — Addendum Note (Signed)
Addended by: Dairl Ponder on: 03/18/2022 04:57 PM   Modules accepted: Orders

## 2022-03-26 NOTE — Progress Notes (Signed)
I think it was mistakenly ordered.

## 2022-04-12 ENCOUNTER — Encounter: Payer: Self-pay | Admitting: Family Medicine

## 2022-04-12 ENCOUNTER — Ambulatory Visit: Payer: Medicare HMO | Admitting: Family Medicine

## 2022-04-12 ENCOUNTER — Other Ambulatory Visit (HOSPITAL_COMMUNITY)
Admission: RE | Admit: 2022-04-12 | Discharge: 2022-04-12 | Disposition: A | Discharge status: Home / Self Care | Payer: Medicare HMO | Source: Ambulatory Visit | Attending: Family Medicine | Admitting: Family Medicine

## 2022-04-12 ENCOUNTER — Ambulatory Visit (HOSPITAL_COMMUNITY)
Admission: RE | Admit: 2022-04-12 | Discharge: 2022-04-12 | Disposition: A | Discharge status: Home / Self Care | Payer: Medicare HMO | Source: Ambulatory Visit | Attending: Family Medicine | Admitting: Family Medicine

## 2022-04-12 VITALS — BP 145/81 | HR 54 | Temp 97.5°F | Wt 210.6 lb

## 2022-04-12 DIAGNOSIS — R0609 Other forms of dyspnea: Secondary | ICD-10-CM | POA: Insufficient documentation

## 2022-04-12 DIAGNOSIS — M7989 Other specified soft tissue disorders: Secondary | ICD-10-CM | POA: Insufficient documentation

## 2022-04-12 DIAGNOSIS — N200 Calculus of kidney: Secondary | ICD-10-CM | POA: Diagnosis present

## 2022-04-12 LAB — BASIC METABOLIC PANEL
Anion gap: 5 (ref 5–15)
BUN: 14 mg/dL (ref 8–23)
CO2: 27 mmol/L (ref 22–32)
Calcium: 9.3 mg/dL (ref 8.9–10.3)
Chloride: 109 mmol/L (ref 98–111)
Creatinine, Ser: 1.03 mg/dL (ref 0.61–1.24)
GFR, Estimated: >60 mL/min (ref >60)
Glucose, Bld: 98 mg/dL (ref 70–99)
Potassium: 4.4 mmol/L (ref 3.5–5.1)
Sodium: 141 mmol/L (ref 135–145)

## 2022-04-12 LAB — D-DIMER, QUANTITATIVE: D-Dimer, Quant: <0.27 ug/mL-FEU (ref 0.00–0.50)

## 2022-04-12 LAB — BRAIN NATRIURETIC PEPTIDE: B Natriuretic Peptide: 99 pg/mL (ref 0.0–100.0)

## 2022-04-12 NOTE — Progress Notes (Addendum)
   Subjective:    Patient ID: Bradley [name], male    DOB: 16-May-1946, 76 y.o.   MRN: [id number]  HPI Pt arrives due to trouble urination due to kidney stone. Pt states left leg is swelling and numb. Pt has been taking fluid pills. Pt believes the kidney stone is "stuck" and is the root of the issue. Pt states he has had the kidney stone for a while. Pt does have some pain on LLQ.  He describes some intermittent numbness into the left leg.  He also describes it as feeling somewhat heavy and swollen.  He took some diuretics and state he felt somewhat better.  Denied any chest tightness pressure pain or shortness of breath Has a history of a kidney stone but on most recent CAT scan kidney stone was not mentioned in this complex the patient He denies any hematuria or flank pain Patient also relates intermittent low back pain and discomfort with achy feeling. Review of Systems     Objective:   Physical Exam  Lungs clear hearts regular abdomen soft no tenderness no masses Left Knee is somewhat swollen compared to the right But nontender Mid there is approximately half inch greater in circumference      Assessment & Plan:   1. Left leg swelling Stat ultrasound ordered Came back negative More than likely venous insufficiency causing the swelling Consider knee-high surgical stocking  - US Venous Img Lower Unilateral Left - Basic metabolic panel - Brain natriuretic peptide - D-dimer, quantitative  2. Kidney stone None was seen on most recent CT scan but it was not commented on we will touch base with radiologist make sure they do not see 1 - US Venous Img Lower Unilateral Left - Basic metabolic panel - Brain natriuretic peptide - D-dimer, quantitative  3. DOE (dyspnea on exertion) Deconditioning most likely cause but will try to rule out any possibility of pulmonary embolus as well as congestive heart failure by doing enzymes  Should be noted that the blood test D-dimer and BNP  came back negative   - US Venous Img Lower Unilateral Left - Basic metabolic panel - Brain natriuretic peptide - D-dimer, quantitative Please note patient has been having back pain and some leg numbness if worse follow-up

## 2022-05-11 ENCOUNTER — Ambulatory Visit: Payer: Medicare HMO | Admitting: Family Medicine

## 2022-05-11 ENCOUNTER — Encounter: Payer: Self-pay | Admitting: Family Medicine

## 2022-05-11 VITALS — BP 134/78 | Wt 214.2 lb

## 2022-05-11 DIAGNOSIS — M7989 Other specified soft tissue disorders: Secondary | ICD-10-CM | POA: Diagnosis not present

## 2022-05-11 DIAGNOSIS — N200 Calculus of kidney: Secondary | ICD-10-CM

## 2022-05-11 MED ORDER — CARBAMAZEPINE 200 MG PO TABS: 200.0000 mg | ORAL_TABLET | Freq: Two times a day (BID) | ORAL | 2 refills | Status: DC | PRN

## 2022-05-11 NOTE — Progress Notes (Signed)
   Subjective:    Patient ID: Bradley [name], male    DOB: 1946/04/08, 76 y.o.   MRN: [id number]  HPI Pt arrives to follow up on left leg swelling. Pt states he has had no more swelling. Pt states he thinks he passed the kidney stone. Pt has noticed his flow is better when going to bathroom.  He states urine flow good denies abdominal pain denies leg pain or tenderness  Review of Systems     Objective:   Physical Exam Lungs clear heart regular abdomen soft left leg is no longer swollen to the degree it was its only 1/8 of an inch greater in circumference compared to the right       Assessment & Plan:  Left leg swelling is gone down Kidney stone doing better Follow-up if progressive troubles Wellness in February Patient asked for [place] to prescribe Tegretol to help him with his symptoms of nerve related pain in the scalp he was given a short prescription he does get blood work once or twice a year to monitor liver enzymes through the [place]

## 2022-05-27 ENCOUNTER — Telehealth: Payer: Self-pay | Admitting: Family Medicine

## 2022-05-27 DIAGNOSIS — M7989 Other specified soft tissue disorders: Secondary | ICD-10-CM

## 2022-05-27 NOTE — Telephone Encounter (Signed)
If I have an opening tomorrow I can see him but if I do not have an opening I cannot see him he would need to see one of the other providers they can always talk with me while the patient is here  Otherwise it may be Monday or so before I see him depending on my schedule I would recommend a stat D-dimer through the hospital lab in the morning if this comes back positive he will need to have an ultrasound

## 2022-05-27 NOTE — Telephone Encounter (Signed)
Pt called in and states that left leg has began to swell again. Pt noticed the swelling a couple days ago. States there is numbness from knee to foot. No pain but feels sore. Pt states he is worsening each day. Pt using ice pack to help at this time. Offered pt appt with another provider but pt states he would rather see Dr.Scott. please advise. Thank you.

## 2022-05-27 NOTE — Telephone Encounter (Signed)
Pt wife contacted as requested per patient. Pt states he would like to wait until Monday to see PCP. Appt set up for 10 am on Monday 05/31/22. STAT D-Dimer order placed and faxed to [facility]. Pt wife verbalized understanding.

## 2022-05-27 NOTE — Addendum Note (Signed)
Addended by: Vicente Males on: 05/27/2022 02:52 PM   Modules accepted: Orders

## 2022-05-27 NOTE — Telephone Encounter (Signed)
Error

## 2022-05-28 ENCOUNTER — Telehealth: Payer: Self-pay | Admitting: Family Medicine

## 2022-05-28 ENCOUNTER — Other Ambulatory Visit (HOSPITAL_COMMUNITY)
Admission: RE | Admit: 2022-05-28 | Discharge: 2022-05-28 | Disposition: A | Discharge status: Home / Self Care | Payer: Medicare HMO | Source: Ambulatory Visit | Attending: Family Medicine | Admitting: Family Medicine

## 2022-05-28 DIAGNOSIS — M7989 Other specified soft tissue disorders: Secondary | ICD-10-CM | POA: Insufficient documentation

## 2022-05-28 LAB — D-DIMER, QUANTITATIVE: D-Dimer, Quant: <0.27 ug/mL-FEU (ref 0.00–0.50)

## 2022-05-28 NOTE — Telephone Encounter (Signed)
Pt D-Dimer results came back.  Per Provider-d dimer normal please tell pt he can keep follow up on 13th  Pt wife contacted and verbalized understanding.

## 2022-05-31 ENCOUNTER — Ambulatory Visit: Payer: Medicare HMO | Admitting: Family Medicine

## 2022-05-31 ENCOUNTER — Ambulatory Visit (HOSPITAL_COMMUNITY)
Admission: RE | Admit: 2022-05-31 | Discharge: 2022-05-31 | Disposition: A | Discharge status: Home / Self Care | Payer: Medicare HMO | Source: Ambulatory Visit | Attending: Family Medicine | Admitting: Family Medicine

## 2022-05-31 VITALS — BP 130/84 | HR 65 | Temp 98.2°F | Wt 211.0 lb

## 2022-05-31 DIAGNOSIS — M545 Low back pain, unspecified: Secondary | ICD-10-CM

## 2022-05-31 DIAGNOSIS — R200 Anesthesia of skin: Secondary | ICD-10-CM | POA: Insufficient documentation

## 2022-05-31 NOTE — Progress Notes (Signed)
   Subjective:    Patient ID: Bradley [name], male    DOB: Sep 13, 1945, 76 y.o.   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  Unable to walk long distances.  Denies any injury.  Also intermittent right lower groin discomfort previous CT scan did not show a kidney stone did show small inguinal hernia of fat Patient denies any vomiting diarrhea bloody stools.  Denies weakness in his legs.   Review of Systems Left Leg numbness from knee down . Low back pain chronic , right side abd hurts worse with bending over    Objective:   Physical Exam General-in no acute distress Extremities skin warm dry no edema Neuro grossly normal Behavior normal, alert  No sign of DVT on exam.  There is no appreciable swelling no tenderness Strength in both legs equal.  Subjective numbness worse on the left than the right Complains of soreness and stiffness in the lower back     Assessment & Plan:  1. Bilateral leg numbness It is quite possible this could be spinal stenosis issues going on will need to have further work-up.  Start off with x-rays.  May well need MRI Exercises were shown to the patient.  It would be difficult for him to do physical therapy because of the distance he would have to go to get here. - DG Lumbar Spine Complete  2. Lumbar pain See above - DG Lumbar Spine Complete  We will recheck him in 4 weeks time.

## 2022-06-01 ENCOUNTER — Other Ambulatory Visit: Payer: Self-pay

## 2022-06-01 DIAGNOSIS — R200 Anesthesia of skin: Secondary | ICD-10-CM

## 2022-06-01 DIAGNOSIS — M545 Low back pain, unspecified: Secondary | ICD-10-CM

## 2022-06-27 ENCOUNTER — Telehealth: Payer: Self-pay | Admitting: Family Medicine

## 2022-06-27 NOTE — Telephone Encounter (Signed)
Unfortunately insurance company is denying the MRI see documentation below patient has follow-up office visit in approximately 1 week we will discuss it further with him  Hester Mates, Elayne Snare, MD Cc: Vicente Males, LPN; Dairl Ponder, RN; Anastasio Auerbach R, LPN Good Afternoon :) This Patient's MRI was denied. The reasoning is stated below  " Imaging requires 6 weeks of Provider directed treatment to be completed. This must have been completed in the past 3 months without improved symptoms"

## 2022-06-28 ENCOUNTER — Encounter: Payer: Self-pay | Admitting: Family Medicine

## 2022-06-28 ENCOUNTER — Ambulatory Visit (INDEPENDENT_AMBULATORY_CARE_PROVIDER_SITE_OTHER): Payer: Medicare HMO | Admitting: Family Medicine

## 2022-06-28 VITALS — BP 124/82 | Wt 208.6 lb

## 2022-06-28 DIAGNOSIS — R200 Anesthesia of skin: Secondary | ICD-10-CM

## 2022-06-28 DIAGNOSIS — M545 Low back pain, unspecified: Secondary | ICD-10-CM | POA: Diagnosis not present

## 2022-06-28 MED ORDER — CYCLOBENZAPRINE HCL 5 MG PO TABS: ORAL_TABLET | ORAL | 0 refills | Status: AC

## 2022-06-28 NOTE — Progress Notes (Addendum)
   Subjective:    Patient ID: Bradley [name], male    DOB: 1946/04/12, 76 y.o.   MRN: [id number]  HPI Pt arrives for follow up. Pt had Xray completed but insurance denied MRI. Pt has been taking wife muscle relaxers-Flexeril and pt reports that has been helping.  Patient was told that muscle relaxers are not the best idea for long-term use if he is using over the next week or 2 to help that would be okay but long-term no increased risk of falls Stretches were emphasized This verifies that the history review of any tests, physical exam, assessment and plan were conducted by Dr. Sallee Lange and documented accordingly by him today Sallee Lange MD primary care [facility]  Review of Systems     Objective:   Physical Exam General-in no acute distress Eyes-no discharge Lungs-respiratory rate normal, CTA CV-no murmurs,RRR Extremities skin warm dry no edema Neuro grossly normal Behavior normal, alert Tight hamstrings negative straight leg raise no edema on exam  X-rays reviewed with family/patient    Assessment & Plan:   1. Bilateral leg numbness This is improving.  I recommend continuing with stretches.  Hold off on any type of MRI at this point.  2. Lumbar pain Stretches were shown He requested a short prescription for muscle relaxers I would not recommend these for long-term use may use 1 in the evening as needed to help with sleep when his back and legs are bothering him Caution drowsiness Caution falls  Follow-up 3 months

## 2022-08-24 ENCOUNTER — Ambulatory Visit (INDEPENDENT_AMBULATORY_CARE_PROVIDER_SITE_OTHER): Payer: Medicare HMO | Admitting: Family Medicine

## 2022-08-24 VITALS — BP 120/68 | Ht 64.5 in | Wt 212.2 lb

## 2022-08-24 DIAGNOSIS — Z0000 Encounter for general adult medical examination without abnormal findings: Secondary | ICD-10-CM | POA: Diagnosis not present

## 2022-08-24 NOTE — Progress Notes (Signed)
   Subjective:    Patient ID: Bradley [name], male    DOB: Dec 03, 1945, 77 y.o.   MRN: [id number]  HPI AWV- Annual Wellness Visit  The patient was seen for their annual wellness visit. The patient's past medical history, surgical history, and family history were reviewed. Pertinent vaccines were reviewed ( tetanus, pneumonia, shingles, flu) The patient's medication list was reviewed and updated.  The height and weight were entered.  BMI recorded in electronic record elsewhere  Cognitive screening was completed. Outcome of Mini - Cog: Passes   Falls /depression screening electronically recorded within record elsewhere  Current tobacco usage: None (All patients who use tobacco were given written and verbal information on quitting)  Recent listing of emergency department/hospitalizations over the past year were reviewed.  current specialist the patient sees on a regular basis: None   Medicare annual wellness visit patient questionnaire was reviewed.  A written screening schedule for the patient for the next 5-10 years was given. Appropriate discussion of followup regarding next visit was discussed.  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 that he states the specialist is going to be removing in the near future   Review of Systems     Objective:   Physical Exam General-in no acute distress Eyes-no discharge Lungs-respiratory rate normal, CTA CV-no murmurs,RRR Extremities skin warm dry no edema Neuro grossly normal Behavior normal, alert Prostate exam normal        Assessment & Plan:   Tdap recommended through the pharmacy Lab work recommended through the [facility] Preventative health discussed Up-to-date on colonoscopy Patient again was copy of the lab work sent to [place] Adult wellness-complete.wellness physical was conducted today. Importance of diet and exercise were discussed in detail.  Importance of stress reduction and healthy living were discussed.  In  addition to this a discussion regarding safety was also covered.  We also reviewed over immunizations and gave recommendations regarding current immunization needed for age.   In addition to this additional areas were also touched on including: Preventative health exams needed:  Colonoscopy next one 2025  Patient was advised yearly wellness exam

## 2022-08-27 ENCOUNTER — Ambulatory Visit (INDEPENDENT_AMBULATORY_CARE_PROVIDER_SITE_OTHER): Payer: Medicare HMO | Admitting: Nurse Practitioner

## 2022-08-27 VITALS — BP 126/80 | HR 90 | Temp 98.8°F | Wt 212.0 lb

## 2022-08-27 DIAGNOSIS — R300 Dysuria: Secondary | ICD-10-CM | POA: Diagnosis not present

## 2022-08-27 DIAGNOSIS — R8281 Pyuria: Secondary | ICD-10-CM | POA: Diagnosis not present

## 2022-08-27 LAB — POCT UA - MICROSCOPIC ONLY

## 2022-08-27 LAB — POCT URINALYSIS DIP (CLINITEK)
Glucose, UA: NEGATIVE mg/dL
Nitrite, UA: POSITIVE — AB
POC PROTEIN,UA: 30 — AB
Spec Grav, UA: 1.01 (ref 1.010–1.025)
pH, UA: 6 (ref 5.0–8.0)

## 2022-08-27 MED ORDER — PHENAZOPYRIDINE HCL 100 MG PO TABS: ORAL_TABLET | ORAL | 0 refills | Status: DC

## 2022-08-27 MED ORDER — CIPROFLOXACIN HCL 500 MG PO TABS: 500.0000 mg | ORAL_TABLET | Freq: Two times a day (BID) | ORAL | 1 refills | Status: DC

## 2022-08-27 NOTE — Progress Notes (Signed)
[facility]  Subjective:    Patient ID: Bradley [name], male    DOB: May 04, 1946, 77 y.o.   MRN: [id number]  HPI Patient arrives today dysuria, polyuria, chills, burning and no fever. Began yesterday after his prostate exam during his regular physical.  Patient states this has happened once before.  Very hot to the touch with chills last night.  Did not check his temperature.  Voiding small amounts with urgency and frequency.  Difficulty urinating.  Burning and pain with urination.  No obvious blood.  Describes his urine is cloudy and dark.  Drinking fluids, states probably not as much as he should.  Decreased appetite today.  No back or flank pain.  Last UTI March 2022.    Review of Systems  Constitutional:  Positive for appetite change, chills, fatigue and fever.  Respiratory:  Negative for cough, chest tightness and shortness of breath.   Cardiovascular:  Negative for chest pain.  Gastrointestinal:  Negative for abdominal pain, nausea and vomiting.  Genitourinary:  Positive for difficulty urinating, dysuria, frequency and urgency. Negative for flank pain and hematuria.  Musculoskeletal:  Negative for back pain.       Objective:   Physical Exam NAD.  Alert, oriented.  Lungs clear.  Heart regular rate rhythm.  No CVA tenderness.  Abdomen soft nondistended nontender. Today's Vitals   08/27/22 1539  BP: 126/80  Pulse: 90  Temp: 98.8 F (37.1 C)  SpO2: 98%  Weight: 212 lb (96.2 kg)   Body mass index is 35.83 kg/m.   Results for orders placed or performed in visit on 08/27/22  POCT URINALYSIS DIP (CLINITEK)  Result Value Ref Range   Color, UA orange (A) yellow   Clarity, UA clear clear   Glucose, UA negative negative mg/dL   Bilirubin, UA     Ketones, POC UA trace (5) (A) negative mg/dL   Spec Grav, UA 1.010 1.010 - 1.025   Blood, UA moderate (A) negative   pH, UA 6.0 5.0 - 8.0   POC PROTEIN,UA =30 (A) negative, trace   Urobilinogen, UA     Nitrite, UA Positive (A) Negative    Leukocytes, UA Moderate (2+) (A) Negative  POCT UA - Microscopic Only  Result Value Ref Range   WBC, Ur, HPF, POC TNTC 0 - 5   RBC, Urine, Miroscopic occas 0 - 2   Bacteria, U Microscopic many None - Trace   Mucus, UA     Epithelial cells, urine per micros     Crystals, Ur, HPF, POC     Casts, Ur, LPF, POC     Yeast, UA          Assessment & Plan:   Problem List Items Addressed This Visit       Other   Pyuria   Other Visit Diagnoses     Dysuria    -  Primary   Relevant Orders   POCT URINALYSIS DIP (CLINITEK) (Completed)   Urine Culture   POCT UA - Microscopic Only (Completed)      Meds ordered this encounter  Medications   ciprofloxacin (CIPRO) 500 MG tablet    Sig: Take 1 tablet (500 mg total) by mouth 2 (two) times daily.    Dispense:  20 tablet    Refill:  1    Order Specific Question:   Supervising Provider    Answer:   Sallee Lange A [[id number]]   phenazopyridine (PYRIDIUM) 100 MG tablet    Sig: Take one po TID prn  bladder spasms    Dispense:  10 tablet    Refill:  0    Order Specific Question:   Supervising Provider    Answer:   Sallee Lange A [[id number]]   Start Cipro as directed.  Urine culture pending.  Patient states he had to repeat the course of his antibiotics last time due to prostate involvement.  1 refill provided in case it is needed.  Advised patient to increase his fluid intake.  Warning signs reviewed including vomiting, abdominal flank or back pain.  Patient to go to ED this weekend if symptoms worsen.  Call back in 72 hours if no improvement.

## 2022-08-28 ENCOUNTER — Encounter: Payer: Self-pay | Admitting: Nurse Practitioner

## 2022-08-28 DIAGNOSIS — R8281 Pyuria: Secondary | ICD-10-CM | POA: Insufficient documentation

## 2022-08-29 ENCOUNTER — Encounter: Payer: Self-pay | Admitting: Family Medicine

## 2022-08-29 NOTE — Progress Notes (Signed)
Front Please mail this to Tora Kindred MD [place] [place], [place]

## 2022-09-01 LAB — URINE CULTURE

## 2022-09-01 LAB — SPECIMEN STATUS REPORT

## 2022-09-13 ENCOUNTER — Encounter (HOSPITAL_COMMUNITY): Payer: Self-pay | Admitting: Emergency Medicine

## 2022-09-13 ENCOUNTER — Emergency Department (HOSPITAL_COMMUNITY)
Admission: EM | Admit: 2022-09-13 | Discharge: 2022-09-13 | Disposition: A | Discharge status: Home / Self Care | Payer: Medicare HMO | Attending: Emergency Medicine | Admitting: Emergency Medicine

## 2022-09-13 ENCOUNTER — Emergency Department (HOSPITAL_COMMUNITY): Payer: Medicare HMO

## 2022-09-13 ENCOUNTER — Ambulatory Visit (INDEPENDENT_AMBULATORY_CARE_PROVIDER_SITE_OTHER): Payer: Medicare HMO | Admitting: Family Medicine

## 2022-09-13 VITALS — BP 158/84 | HR 84 | Temp 98.7°F | Wt 208.2 lb

## 2022-09-13 DIAGNOSIS — I10 Essential (primary) hypertension: Secondary | ICD-10-CM | POA: Diagnosis not present

## 2022-09-13 DIAGNOSIS — Z7982 Long term (current) use of aspirin: Secondary | ICD-10-CM | POA: Diagnosis not present

## 2022-09-13 DIAGNOSIS — Z79899 Other long term (current) drug therapy: Secondary | ICD-10-CM | POA: Diagnosis not present

## 2022-09-13 DIAGNOSIS — N132 Hydronephrosis with renal and ureteral calculous obstruction: Secondary | ICD-10-CM | POA: Insufficient documentation

## 2022-09-13 DIAGNOSIS — N200 Calculus of kidney: Secondary | ICD-10-CM

## 2022-09-13 DIAGNOSIS — R300 Dysuria: Secondary | ICD-10-CM

## 2022-09-13 DIAGNOSIS — R1032 Left lower quadrant pain: Secondary | ICD-10-CM

## 2022-09-13 DIAGNOSIS — R109 Unspecified abdominal pain: Secondary | ICD-10-CM | POA: Diagnosis present

## 2022-09-13 LAB — CBC WITH DIFFERENTIAL/PLATELET
Abs Immature Granulocytes: 0.05 10*3/uL (ref 0.00–0.07)
Basophils Absolute: 0 10*3/uL (ref 0.0–0.1)
Basophils Relative: 0 %
Eosinophils Absolute: 0 10*3/uL (ref 0.0–0.5)
Eosinophils Relative: 0 %
HCT: 46.7 % (ref 39.0–52.0)
Hemoglobin: 15.8 g/dL (ref 13.0–17.0)
Immature Granulocytes: 1 %
Lymphocytes Relative: 14 %
Lymphs Abs: 1.5 10*3/uL (ref 0.7–4.0)
MCH: 31 pg (ref 26.0–34.0)
MCHC: 33.8 g/dL (ref 30.0–36.0)
MCV: 91.7 fL (ref 80.0–100.0)
Monocytes Absolute: 0.8 10*3/uL (ref 0.1–1.0)
Monocytes Relative: 8 %
Neutro Abs: 8.3 10*3/uL — ABNORMAL HIGH (ref 1.7–7.7)
Neutrophils Relative %: 77 %
Platelets: 228 10*3/uL (ref 150–400)
RBC: 5.09 MIL/uL (ref 4.22–5.81)
RDW: 13.4 % (ref 11.5–15.5)
WBC: 10.8 10*3/uL — ABNORMAL HIGH (ref 4.0–10.5)
nRBC: 0 % (ref 0.0–0.2)

## 2022-09-13 LAB — POCT URINALYSIS DIP (CLINITEK)
Spec Grav, UA: 1.015 (ref 1.010–1.025)
pH, UA: 6.5 (ref 5.0–8.0)

## 2022-09-13 LAB — COMPREHENSIVE METABOLIC PANEL
ALT: 37 U/L (ref 0–44)
AST: 44 U/L — ABNORMAL HIGH (ref 15–41)
Albumin: 3.9 g/dL (ref 3.5–5.0)
Alkaline Phosphatase: 76 U/L (ref 38–126)
Anion gap: 11 (ref 5–15)
BUN: 12 mg/dL (ref 8–23)
CO2: 26 mmol/L (ref 22–32)
Calcium: 8.6 mg/dL — ABNORMAL LOW (ref 8.9–10.3)
Chloride: 101 mmol/L (ref 98–111)
Creatinine, Ser: 1.27 mg/dL — ABNORMAL HIGH (ref 0.61–1.24)
GFR, Estimated: 59 mL/min — ABNORMAL LOW (ref >60)
Glucose, Bld: 97 mg/dL (ref 70–99)
Potassium: 3.9 mmol/L (ref 3.5–5.1)
Sodium: 138 mmol/L (ref 135–145)
Total Bilirubin: 0.9 mg/dL (ref 0.3–1.2)
Total Protein: 6.8 g/dL (ref 6.5–8.1)

## 2022-09-13 LAB — URINALYSIS, ROUTINE W REFLEX MICROSCOPIC
Bilirubin Urine: NEGATIVE
Glucose, UA: NEGATIVE mg/dL
Ketones, ur: 5 mg/dL — AB
Leukocytes,Ua: NEGATIVE
Nitrite: NEGATIVE
Protein, ur: NEGATIVE mg/dL
Specific Gravity, Urine: 1.036 — ABNORMAL HIGH (ref 1.005–1.030)
pH: 7 (ref 5.0–8.0)

## 2022-09-13 LAB — LIPASE, BLOOD: Lipase: 23 U/L (ref 11–51)

## 2022-09-13 MED ORDER — ONDANSETRON 4 MG PO TBDP: 4.0000 mg | ORAL_TABLET | Freq: Three times a day (TID) | ORAL | 0 refills | Status: DC | PRN

## 2022-09-13 MED ORDER — SODIUM CHLORIDE 0.9 % IV BOLUS
1000.0000 mL | Freq: Once | INTRAVENOUS | Status: AC
  Administered 2022-09-13: 1000 mL via INTRAVENOUS

## 2022-09-13 MED ORDER — HYDROCODONE-ACETAMINOPHEN 5-325 MG PO TABS: 1.0000 | ORAL_TABLET | Freq: Four times a day (QID) | ORAL | 0 refills | Status: DC | PRN

## 2022-09-13 MED ORDER — MELOXICAM 7.5 MG PO TABS: 7.5000 mg | ORAL_TABLET | Freq: Two times a day (BID) | ORAL | 0 refills | Status: AC | PRN

## 2022-09-13 MED ORDER — KETOROLAC TROMETHAMINE 30 MG/ML IJ SOLN
30.0000 mg | Freq: Once | INTRAMUSCULAR | Status: AC
  Administered 2022-09-13: 30 mg via INTRAVENOUS
  Filled 2022-09-13: qty 1

## 2022-09-13 MED ORDER — IOHEXOL 300 MG/ML  SOLN
100.0000 mL | Freq: Once | INTRAMUSCULAR | Status: AC | PRN
  Administered 2022-09-13: 100 mL via INTRAVENOUS

## 2022-09-13 NOTE — Discharge Instructions (Signed)
Your CT scan shows that you have a kidney stone on the left, please make sure you are taking your medications including your Flomax.  I have added a couple of other medications for you, make sure that you finish all of these medications if needed.  Only take the hydrocodone if you are having severe pain as it can make you nauseated and constipated.  Zofran can be used every 6 hours and the Mobic should be taken twice a day.  Please take Mobic, 7.5 mg by mouth twice daily as needed for pain - this in an antiinflammatory medicine (NSAID) and is similar to ibuprofen - many people feel that it is stronger than ibuprofen and it is easier to take since it is a smaller pill.  Please use this only for 1 week - if your pain persists, you will need to follow up with your doctor in the office for ongoing guidance and pain control.  Zofran is a medication which can help with nausea.  You may take 4 mg by mouth every 6 hours as needed if you are an adult, if your child under the age of 6 take half of a tablet or 2 mg every 6 hours as needed.  This should dissolve on your tongue within a short timeframe.  Wait about 30 minutes after taking it to help with drinking clear liquids.

## 2022-09-13 NOTE — Progress Notes (Signed)
   Subjective:    Patient ID: Bradley [name], male    DOB: 03/28/1946, 77 y.o.   MRN: [id number]  HPI Patient arrives today with concerns about having UTI and lower abdominal pain.  He relates the abdominal pain started 2 to 3 days ago he describes as severe worse on the left side and in the middle states he is able to urinate states his bowels seem to be somewhat constipated no nausea or vomiting he states that he felt terrible throughout the night could not sleep because of the abdominal pain losing his appetite.  He states today he did have some fever and chills. 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 After further discussion with the patient the severity of his pain is such to where we will move forward with ER evaluation ER triage spoke with More than likely diverticulitis but cannot rule out urologic issues such as prostatitis Patient more than likely will need lab work as well as CAT scan

## 2022-09-13 NOTE — ED Provider Triage Note (Signed)
Emergency Medicine Provider Triage Evaluation Note  Bradley [name] , a 77 y.o. male  was evaluated in triage.  Pt complains of lower abdominal pain since yesterday.  Patient has been recently treated for UTI and symptoms have resolved.  Pain is located mostly in the left lower quadrant, constant, nonradiating.  Endorses nausea no vomiting.  Endorses cold/chills at home.  Also reports constipation.  He is seeing blood in stool but attributed it for hemorrhoid. Review of Systems  Positive: As above Negative: As above  Physical Exam  BP (!) 160/95 (BP Location: Right Arm)   Pulse 79   Temp 98.4 F (36.9 C) (Oral)   Resp 18   SpO2 98%  Gen:   Awake, no distress   Resp:  Normal effort  MSK:   Moves extremities without difficulty  Other:  TTP to left lower quadrant.  Medical Decision Making  Medically screening exam initiated at 6:11 PM.  Appropriate orders placed.  CHEZ CHANNER was informed that the remainder of the evaluation will be completed by another provider, this initial triage assessment does not replace that evaluation, and the importance of remaining in the ED until their evaluation is complete.    Bradley [name], [place] 09/13/22 2333

## 2022-09-13 NOTE — Addendum Note (Signed)
Addended by: Orvan Seen on: 09/13/2022 04:45 PM   Modules accepted: Orders

## 2022-09-13 NOTE — ED Provider Notes (Signed)
[facility] Provider Note   CSN: [id number] Arrival date & time: 09/13/22  1657     History  Chief Complaint  Patient presents with   Abdominal Pain    Bradley [name] is a 77 y.o. male.   Abdominal Pain  This patient is a 77 year old male, he has a history of high cholesterol as well as a history of hypertension, he denies any significant prior abdominal surgical history.  He does report that he had a kidney stone that was seen on the CT scan from July of last year.  He also had a urinary tract infection for which she was treated for twice over the last month and ultimately improved, no longer has dysuria.  Over the last 2 days he has had increasing pain mostly on the left side, no nausea or vomiting, feels like he might be constipated.  Sent by family doctor for further evaluation    Home Medications Prior to Admission medications   Medication Sig Start Date End Date Taking? Authorizing Provider  HYDROcodone-acetaminophen (NORCO/VICODIN) 5-325 MG tablet Take 1 tablet by mouth every 6 (six) hours as needed. 09/13/22  Yes Noemi Chapel, MD  meloxicam (MOBIC) 7.5 MG tablet Take 1 tablet (7.5 mg total) by mouth 2 (two) times daily as needed for up to 14 days for pain. 09/13/22 09/27/22 Yes Noemi Chapel, MD  ondansetron (ZOFRAN-ODT) 4 MG disintegrating tablet Take 1 tablet (4 mg total) by mouth every 8 (eight) hours as needed for nausea. 09/13/22  Yes Noemi Chapel, MD  allopurinol (ZYLOPRIM) 100 MG tablet Take 100 mg by mouth daily after breakfast.     [provider]  ALPRAZolam Duanne Moron) 1 MG tablet Take 1 mg by mouth 2 (two) times daily.    [provider]  aspirin EC 81 MG tablet Take 81 mg by mouth at bedtime.     [provider]  atorvastatin (LIPITOR) 40 MG tablet Take 40 mg by mouth every evening.     [provider]  carbamazepine (TEGRETOL) 200 MG tablet Take 1 tablet (200 mg total) by mouth 2 (two)  times daily as needed (neuralgia). 05/11/22   Kathyrn Drown, MD  ciprofloxacin (CIPRO) 500 MG tablet Take 1 tablet (500 mg total) by mouth 2 (two) times daily. 08/27/22   Nilda Simmer, NP  cyanocobalamin (,VITAMIN B-12,) 1000 MCG/ML injection Inject 1,000 mcg into the muscle every 30 (thirty) days.    [provider]  cyclobenzaprine (FLEXERIL) 5 MG tablet 1 qhs prn back pain caution drowsiness 06/28/22   Kathyrn Drown, MD  diphenhydrAMINE (BENADRYL) 25 MG tablet Take 25 mg by mouth every 6 (six) hours as needed for allergies or sleep.     [provider]  furosemide (LASIX) 20 MG tablet Take 20 mg by mouth daily as needed for fluid or edema.    [provider]  gabapentin (NEURONTIN) 300 MG capsule Take 3 capsules (900 mg total) by mouth 2 (two) times daily. Patient taking differently: Take 300 mg by mouth 3 (three) times daily. 07/31/19   Penumalli, Earlean Polka, MD  lisinopril (ZESTRIL) 40 MG tablet Take 40 mg by mouth daily as needed (high blood pressure).    [provider]  naproxen sodium (ANAPROX) 275 MG tablet Take 275 mg by mouth 2 (two) times daily as needed for moderate pain.    [provider]  omeprazole (PRILOSEC) 20 MG capsule Take 20 mg by mouth 2 (two) times  daily before a meal.    [provider]  oxyCODONE-acetaminophen (PERCOCET) 5-325 MG tablet Take 1 tablet by mouth every 4 (four) hours as needed. 01/29/22   McKenzie, Candee Furbish, MD  phenazopyridine (PYRIDIUM) 100 MG tablet Take one po TID prn bladder spasms 08/27/22   Nilda Simmer, NP  pyridostigmine (MESTINON) 60 MG tablet Take 1 tablet (60 mg total) by mouth 2 (two) times daily. Patient taking differently: Take 60 mg by mouth 3 (three) times daily. 07/24/19   Penumalli, Earlean Polka, MD  SODIUM FLUORIDE 5000 PPM 1.1 % PSTE Place 1 application. onto teeth at bedtime. 09/22/20   [provider]  tamsulosin (FLOMAX) 0.4 MG CAPS capsule 2 qhs for BPH Patient taking  differently: Take 0.8 mg by mouth at bedtime. 2 qhs for BPH 08/27/21   Luking, Elayne Snare, MD  Vitamin D, Ergocalciferol, (DRISDOL) 1.25 MG (50000 UNIT) CAPS capsule Take 1 capsule (50,000 Units total) by mouth every 7 (seven) days. 03/18/22   Kathyrn Drown, MD      Allergies    Patient has no known allergies.    Review of Systems   Review of Systems  Gastrointestinal:  Positive for abdominal pain.  All other systems reviewed and are negative.   Physical Exam Updated Vital Signs BP (!) 147/88   Pulse 71   Temp 98.4 F (36.9 C) (Oral)   Resp 15   SpO2 97%  Physical Exam Vitals and nursing note reviewed.  Constitutional:      General: He is not in acute distress.    Appearance: He is well-developed.  HENT:     Head: Normocephalic and atraumatic.     Mouth/Throat:     Pharynx: No oropharyngeal exudate.  Eyes:     General: No scleral icterus.       Right eye: No discharge.        Left eye: No discharge.     Conjunctiva/sclera: Conjunctivae normal.     Pupils: Pupils are equal, round, and reactive to light.  Neck:     Thyroid: No thyromegaly.     Vascular: No JVD.  Cardiovascular:     Rate and Rhythm: Normal rate and regular rhythm.     Heart sounds: Normal heart sounds. No murmur heard.    No friction rub. No gallop.  Pulmonary:     Effort: Pulmonary effort is normal. No respiratory distress.     Breath sounds: Normal breath sounds. No wheezing or rales.  Abdominal:     General: Bowel sounds are normal. There is no distension.     Palpations: Abdomen is soft. There is no mass.     Tenderness: There is abdominal tenderness.     Comments: Nonsurgical abdomen, no pain McMurphy's point, there is tenderness on the left but no guarding, there is mild CVA tenderness on the left  Musculoskeletal:        General: No tenderness. Normal range of motion.     Cervical back: Normal range of motion and neck supple.     Right lower leg: No edema.     Left lower leg: No edema.   Lymphadenopathy:     Cervical: No cervical adenopathy.  Skin:    General: Skin is warm and dry.     Findings: No erythema or rash.  Neurological:     Mental Status: He is alert.     Coordination: Coordination normal.  Psychiatric:        Behavior: Behavior normal.  ED Results / Procedures / Treatments   Labs (all labs ordered are listed, but only abnormal results are displayed) Labs Reviewed  COMPREHENSIVE METABOLIC PANEL - Abnormal; Notable for the following components:      Result Value   Creatinine, Ser 1.27 (*)    Calcium 8.6 (*)    AST 44 (*)    GFR, Estimated 59 (*)    All other components within normal limits  CBC WITH DIFFERENTIAL/PLATELET - Abnormal; Notable for the following components:   WBC 10.8 (*)    Neutro Abs 8.3 (*)    All other components within normal limits  URINALYSIS, ROUTINE W REFLEX MICROSCOPIC - Abnormal; Notable for the following components:   Color, Urine STRAW (*)    Specific Gravity, Urine 1.036 (*)    Hgb urine dipstick LARGE (*)    Ketones, ur 5 (*)    Bacteria, UA RARE (*)    All other components within normal limits  LIPASE, BLOOD    EKG None  Radiology CT ABDOMEN PELVIS W CONTRAST  Result Date: 09/13/2022 CLINICAL DATA:  Lower abdominal pain. History of retroperitoneal fibrosis. EXAM: CT ABDOMEN AND PELVIS WITH CONTRAST TECHNIQUE: Multidetector CT imaging of the abdomen and pelvis was performed using the standard protocol following bolus administration of intravenous contrast. RADIATION DOSE REDUCTION: This exam was performed according to the departmental dose-optimization program which includes automated exposure control, adjustment of the mA and/or kV according to patient size and/or use of iterative reconstruction technique. CONTRAST:  135m OMNIPAQUE IOHEXOL 300 MG/ML  SOLN COMPARISON:  CT abdomen and pelvis 10/09/2021 FINDINGS: Lower chest: No acute abnormality. Hepatobiliary: No focal liver abnormality is seen. No gallstones,  gallbladder wall thickening, or biliary dilatation. Pancreas: Unremarkable. No pancreatic ductal dilatation or surrounding inflammatory changes. Spleen: Normal in size without focal abnormality. 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 There is a 4 mm calculus of the left ureterovesicular junction with moderate hydroureteronephrosis involving the lower pole of the left kidney. There is no hydronephrosis of the upper pole of the left kidney. The right kidney appears within normal limits. There is a 3.7 cm cyst in the inferior pole of the left kidney. Bladder and adrenal glands are within normal limits. Stomach/Bowel: Stomach is within normal limits. Appendix appears normal. No evidence of bowel wall thickening, distention, or inflammatory changes. There is sigmoid colon diverticulosis. Vascular/Lymphatic: Aorta and IVC are normal in size. There are atherosclerotic calcifications of the aorta. Lobulated soft tissue density surrounding the infrarenal abdominal aorta measuring up 2 9 mm in thickness appears unchanged compatible with patient's history of retroperitoneal fibrosis. No new enlarged lymph nodes are identified. Reproductive: Prostate gland is enlarged. Other: There are small fat containing inguinal hernias. There is no ascites. Musculoskeletal: No acute osseous findings. Degenerative changes affect the spine hips. IMPRESSION: 1. 4 mm obstructing calculus at the left ureterovesicular junction. 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 2. Stable findings compatible with retroperitoneal fibrosis. 3. Prostatomegaly. 4. Multiple lesions, including left Bosniak I benign renal cyst measuring 3.7 cm. No follow-up imaging is recommended. JACR 2018 Feb; 264-273, Management of the Incidental Renal Mass on CT, [employer] 2021; [phone], Bosniak Classification of Cystic Renal Masses,  Version 2019. Aortic Atherosclerosis (ICD10-I70.0). Electronically Signed   By: ARonney AstersM.D.   On: 09/13/2022 20:16    Procedures Procedures    Medications Ordered in ED Medications  iohexol (OMNIPAQUE) 300 MG/ML  solution 100 mL (100 mLs Intravenous Contrast Given 09/13/22 2003)  ketorolac (TORADOL) 30 MG/ML injection 30 mg (30 mg Intravenous Given 09/13/22 2104)  sodium chloride 0.9 % bolus 1,000 mL (0 mLs Intravenous Stopped 09/13/22 2200)    ED Course/ Medical Decision Making/ A&P                             Medical Decision Making Amount and/or Complexity of Data Reviewed Labs: ordered.  Risk Prescription drug management.   This patient presents to the ED for concern of abdominal discomfort differential diagnosis includes UTI, diverticulitis, kidney stone, aneurysm    Additional history obtained:  Additional history obtained from electronic medical record External records from outside source obtained and reviewed including reviewed office visits from today as well as CT scans that were performed last year   Lab Tests:  I Ordered, and personally interpreted labs.  The pertinent results include: CBC and metabolic panel unremarkable other than a creatinine that is slightly elevated compared to baseline   Imaging Studies ordered:  I ordered imaging studies including CT scan of the abdomen and pelvis I independently visualized and interpreted imaging which showed kidney stone on the left, 4 mm, distal ureterovesical junction I agree with the radiologist interpretation   Medicines ordered and prescription drug management:  I ordered medication including Toradol for pain Reevaluation of the patient after these medicines showed that the patient improved I have reviewed the patients home medicines and have made adjustments as needed   Problem List / ED Course:  Patient's pain improved with ketorolac, CT scan confirms kidney stone, patient agreeable, urinalysis  clean without signs of infection   Social Determinants of Health:  None           Final Clinical Impression(s) / ED Diagnoses Final diagnoses:  Kidney stone on left side    Rx / DC Orders ED Discharge Orders          Ordered    meloxicam (MOBIC) 7.5 MG tablet  2 times daily PRN        09/13/22 2309    ondansetron (ZOFRAN-ODT) 4 MG disintegrating tablet  Every 8 hours PRN        09/13/22 2309    HYDROcodone-acetaminophen (NORCO/VICODIN) 5-325 MG tablet  Every 6 hours PRN        09/13/22 2309              Noemi Chapel, MD 09/13/22 2310

## 2022-09-13 NOTE — ED Triage Notes (Addendum)
LLQ tenderness waking him from sleep. Tender to palpation. PCP sent him due to concerns that need further evaluation. Denies nausea, fevers, diarrhea. He has been constipated. Pt was started on cipro for UTI. MD has note in about visit today - called ahead and suspicious for diverticulitis

## 2022-09-13 NOTE — ED Notes (Signed)
ED Provider at bedside. 

## 2022-09-14 ENCOUNTER — Telehealth: Payer: Self-pay

## 2022-09-14 NOTE — Transitions of Care (Post Inpatient/ED Visit) (Signed)
   09/14/2022  Name: Bradley [name] MRN: [id number] DOB: [birth year]  Today's TOC FU Call Status: Today's TOC FU Call Status:: Successful TOC FU Call Competed TOC FU Call Complete Date: 09/14/22  Transition Care Management Follow-up Telephone Call Date of Discharge: 09/13/22 Discharge Facility: [facility] Type of Discharge: Emergency Department Reason for ED Visit: Other: (kidney stone) How have you been since you were released from the hospital?: Same Any questions or concerns?: No  Items Reviewed: Did you receive and understand the discharge instructions provided?: Yes Medications obtained and verified?: Yes (Medications Reviewed) Any new allergies since your discharge?: No Dietary orders reviewed?: NA Do you have support at home?: Yes People in Home: spouse  Home Care and Equipment/Supplies: [place] Ordered?: NA Any new equipment or medical supplies ordered?: NA  Functional Questionnaire: Do you need assistance with bathing/showering or dressing?: No Do you need assistance with meal preparation?: No Do you need assistance with eating?: No Do you have difficulty maintaining continence: No Do you need assistance with getting out of bed/getting out of a chair/moving?: No Do you have difficulty managing or taking your medications?: No  Folllow up appointments reviewed: PCP Follow-up appointment confirmed?: [facility] Follow-up appointment confirmed?: NA Do you need transportation to your follow-up appointment?: No Do you understand care options if your condition(s) worsen?: Yes-patient verbalized understanding    [place], [place] Direct Dial [phone]

## 2022-09-15 LAB — URINE CULTURE: Organism ID, Bacteria: NO GROWTH

## 2022-09-15 LAB — SPECIMEN STATUS REPORT

## 2022-09-28 ENCOUNTER — Ambulatory Visit: Payer: Medicare HMO | Admitting: Family Medicine

## 2022-09-28 ENCOUNTER — Telehealth: Payer: Self-pay

## 2022-09-28 DIAGNOSIS — N200 Calculus of kidney: Secondary | ICD-10-CM

## 2022-09-28 NOTE — Telephone Encounter (Signed)
Referral to Dr. Alyson Ingles urology please

## 2022-09-28 NOTE — Telephone Encounter (Signed)
Pt needs a referral he is unable to pass kidney stone   Call back [phone] [employer]

## 2022-09-29 NOTE — Telephone Encounter (Signed)
Called and spoke with patient informed him referral was placed per Dr Nicki Reaper for Dr. Alyson Ingles urology. Patient verbalized understanding.

## 2022-10-01 ENCOUNTER — Ambulatory Visit: Payer: Medicare HMO | Admitting: Urology

## 2022-10-01 ENCOUNTER — Ambulatory Visit (HOSPITAL_COMMUNITY)
Admission: RE | Admit: 2022-10-01 | Discharge: 2022-10-01 | Disposition: A | Discharge status: Home / Self Care | Payer: Medicare HMO | Source: Ambulatory Visit | Attending: Urology | Admitting: Urology

## 2022-10-01 ENCOUNTER — Encounter: Payer: Self-pay | Admitting: Urology

## 2022-10-01 VITALS — BP 137/76 | HR 61

## 2022-10-01 DIAGNOSIS — N200 Calculus of kidney: Secondary | ICD-10-CM

## 2022-10-01 LAB — URINALYSIS, ROUTINE W REFLEX MICROSCOPIC
Bilirubin, UA: NEGATIVE
Glucose, UA: NEGATIVE
Ketones, UA: NEGATIVE
Nitrite, UA: NEGATIVE
Specific Gravity, UA: 1.02 (ref 1.005–1.030)
Urobilinogen, Ur: 0.2 mg/dL (ref 0.2–1.0)
pH, UA: 7 (ref 5.0–7.5)

## 2022-10-01 LAB — MICROSCOPIC EXAMINATION: WBC, UA: >30 /hpf — AB (ref 0–5)

## 2022-10-01 NOTE — Progress Notes (Signed)
10/01/2022 1:01 PM   Bradley [name] 02-25-1946 [id number]  Referring provider: Kathyrn Drown, MD [address] [place],  [place] [address]  nephrolithiasis   HPI: Bradley [name] is a 77yo here for evaluation of nephrolithiasis. He had not passed his original 4-47mm left calculus from 01/2022. He developed severe left flank pain and presented to the ER 09/13/2022 showed a 93mm left distal ureteral calculus. He has urinary frequency and urgency. KUB today shows left distal ureteral calculus.    PMH: 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HCC)    Shingles    hx of, on back of neck   Sleep apnea    Noncompliant with CPAP   Trigeminal neuralgia     Surgical History: Past Surgical History:  Procedure Laterality Date   BIOPSY  08/02/2018   Procedure: BIOPSY;  Surgeon: Daneil Dolin, MD;  Location: [facility];  Service: Endoscopy;;  gastric polyp   BIOPSY  11/05/2021   Procedure: BIOPSY;  Surgeon: Daneil Dolin, MD;  Location: [facility];  Service: Endoscopy;;   COLONOSCOPY     COLONOSCOPY  12/09/2004   [id number] polyp at 35 cm biopsied/normal rectum   COLONOSCOPY   05/20/2008   [id number] midsigmoid s/p snare removal/normal rectum. Tubular adenoma   COLONOSCOPY N/A 08/02/2018   Procedure: COLONOSCOPY;  Surgeon: Daneil Dolin, MD;  Location: [facility];  Service: Endoscopy;  Laterality: N/A;  1:15pm   COLONOSCOPY WITH ESOPHAGOGASTRODUODENOSCOPY (EGD) N/A 03/22/2013   Dr. Gala Romney: Barrett's esophagus status post segmental biopsies with no evidence of dysplasia.  Normal colonoscopy.  Next colonoscopy in 5 years.   ESOPHAGOGASTRODUODENOSCOPY  07/01/2003   [id number] colored epithelium involving essentially nearly the distal onehalf of the tubular esophagus/Patulous esophagogastric  junction/ Moderate sized hiatal hernia/Otherwise normal stomach, normal D1 and D2.   ESOPHAGOGASTRODUODENOSCOPY  12/09/2004   [id number] epithelium  beginning at 33 cm consistent with previous diagnosis of Barrett's esophagus  segmentally biopsied. Hiatal hernia. Otherwise normal stomach. Normal D1 and D2   ESOPHAGOGASTRODUODENOSCOPY   05/20/2008   [id number] epithelium distal esophagus consistent with Barrett's esophagus.  No significant change from prior exam. Multiple biopsies negative for dysplasia.   ESOPHAGOGASTRODUODENOSCOPY N/A 08/02/2018   Procedure: ESOPHAGOGASTRODUODENOSCOPY (EGD);  Surgeon: Daneil Dolin, MD;  [facility];  Service: Endoscopy;  Laterality: N/A;   ESOPHAGOGASTRODUODENOSCOPY (EGD) WITH PROPOFOL N/A 11/05/2021   Procedure: ESOPHAGOGASTRODUODENOSCOPY (EGD) WITH PROPOFOL;  Surgeon: Daneil Dolin, MD;  [facility]: [facility];  Service: Endoscopy;  Laterality: N/A;  8:15am   POLYPECTOMY  08/02/2018   Procedure: POLYPECTOMY;  Surgeon: Daneil Dolin, MD;  Location: [facility];  Service: Endoscopy;;  distal transverse colon(CSx1), rectal(CSx1)   TONSILLECTOMY      Home Medications:  Allergies as of 10/01/2022   No Known Allergies      Medication List        Accurate as of October 01, 2022  1:01 PM. If you have any questions, ask your nurse or doctor.          allopurinol 100 MG tablet Commonly known as: ZYLOPRIM Take 100 mg by mouth daily after breakfast.   ALPRAZolam 1 MG tablet Commonly known as: XANAX Take 1 mg by mouth 2 (two) times  daily.   aspirin EC 81 MG tablet Take 81 mg by mouth at bedtime.   atorvastatin 40 MG tablet Commonly known as: LIPITOR Take 40 mg by mouth every evening.   carbamazepine 200 MG tablet Commonly known as: TEGRETOL Take 1 tablet (200 mg total) by mouth 2 (two) times daily as needed (neuralgia).   ciprofloxacin 500 MG tablet Commonly known as: Cipro Take 1 tablet (500 mg total) by mouth  2 (two) times daily.   cyanocobalamin 1000 MCG/ML injection Commonly known as: VITAMIN B12 Inject 1,000 mcg into the muscle every 30 (thirty) days.   cyclobenzaprine 5 MG tablet Commonly known as: FLEXERIL 1 qhs prn back pain caution drowsiness   diphenhydrAMINE 25 MG tablet Commonly known as: BENADRYL Take 25 mg by mouth every 6 (six) hours as needed for allergies or sleep.   furosemide 20 MG tablet Commonly known as: LASIX Take 20 mg by mouth daily as needed for fluid or edema.   gabapentin 300 MG capsule Commonly known as: NEURONTIN Take 3 capsules (900 mg total) by mouth 2 (two) times daily. What changed:  how much to take when to take this   HYDROcodone-acetaminophen 5-325 MG tablet Commonly known as: NORCO/VICODIN Take 1 tablet by mouth every 6 (six) hours as needed.   lisinopril 40 MG tablet Commonly known as: ZESTRIL Take 40 mg by mouth daily as needed (high blood pressure).   naproxen sodium 275 MG tablet Commonly known as: ANAPROX Take 275 mg by mouth 2 (two) times daily as needed for moderate pain.   omeprazole 20 MG capsule Commonly known as: PRILOSEC Take 20 mg by mouth 2 (two) times daily before a meal.   ondansetron 4 MG disintegrating tablet Commonly known as: ZOFRAN-ODT Take 1 tablet (4 mg total) by mouth every 8 (eight) hours as needed for nausea.   oxyCODONE-acetaminophen 5-325 MG tablet Commonly known as: Percocet Take 1 tablet by mouth every 4 (four) hours as needed.   phenazopyridine 100 MG tablet Commonly known as: Pyridium Take one po TID prn bladder spasms   pyridostigmine 60 MG tablet Commonly known as: MESTINON Take 1 tablet (60 mg total) by mouth 2 (two) times daily. What changed: when to take this   Sodium Fluoride 5000 PPM 1.1 % Pste Generic drug: Sodium Fluoride Place 1 application. onto teeth at bedtime.   tamsulosin 0.4 MG Caps capsule Commonly known as: FLOMAX 2 qhs for BPH What changed:  how much to take how to take  this when to take this   Vitamin D (Ergocalciferol) 1.25 MG (50000 UNIT) Caps capsule Commonly known as: DRISDOL Take 1 capsule (50,000 Units total) by mouth every 7 (seven) days.        Allergies: No Known Allergies  Family History: Family History  Problem Relation Age of Onset   Heart attack Father 12       First MI in 30s   Cancer Brother        prostate   Colon cancer Mother        age greater than 57s   Cancer Sister        brain   COPD Brother     Social History:  reports that he has never smoked. He has never used smokeless tobacco. He reports that he does not drink alcohol and does not use drugs.  ROS: All other review of systems were reviewed and are negative except what is noted above in HPI  Physical Exam: BP 137/76   Pulse 61  Constitutional:  Alert and oriented, No acute distress. HEENT: [place] AT, moist mucus membranes.  Trachea midline, no masses. Cardiovascular: No clubbing, cyanosis, or edema. Respiratory: Normal respiratory effort, no increased work of breathing. GI: Abdomen is soft, nontender, nondistended, no abdominal masses GU: No CVA tenderness.  Lymph: No cervical or inguinal lymphadenopathy. Skin: No rashes, bruises or suspicious lesions. Neurologic: Grossly intact, no focal deficits, moving all 4 extremities. Psychiatric: Normal mood and affect.  Laboratory Data: Lab Results  Component Value Date   WBC 10.8 (H) 09/13/2022   HGB 15.8 09/13/2022   HCT 46.7 09/13/2022   MCV 91.7 09/13/2022   PLT 228 09/13/2022    Lab Results  Component Value Date   CREATININE 1.27 (H) 09/13/2022    No results found for: "PSA"  No results found for: "TESTOSTERONE"  Lab Results  Component Value Date   HGBA1C 5.6 07/23/2016    Urinalysis    Component Value Date/Time   COLORURINE STRAW (A) 09/13/2022 2205   APPEARANCEUR CLEAR 09/13/2022 2205   APPEARANCEUR Clear 01/29/2022 1208   LABSPEC 1.036 (H) 09/13/2022 2205   PHURINE 7.0 09/13/2022  2205   GLUCOSEU NEGATIVE 09/13/2022 2205   HGBUR LARGE (A) 09/13/2022 2205   BILIRUBINUR NEGATIVE 09/13/2022 2205   BILIRUBINUR Negative 01/29/2022 1208   KETONESUR 5 (A) 09/13/2022 2205   PROTEINUR NEGATIVE 09/13/2022 2205   UROBILINOGEN negative (A) 11/16/2021 1324   UROBILINOGEN 0.2 08/07/2012 1021   NITRITE NEGATIVE 09/13/2022 2205   LEUKOCYTESUR NEGATIVE 09/13/2022 2205    Lab Results  Component Value Date   LABMICR See below: 01/29/2022   WBCUA 6-10 (A) 01/29/2022   LABEPIT 0-10 01/29/2022   MUCUS Present 01/29/2022   BACTERIA RARE (A) 09/13/2022    Pertinent Imaging: KUb today: Images  Results for orders placed in visit on 11/16/21  DG Abd 1 View  Narrative CLINICAL DATA:  Lower abdominal pain. History kidney stones. Clinical constipation.  EXAM: ABDOMEN - 1 VIEW  COMPARISON:  03/12/2021  FINDINGS: Two supine views of the abdomen and pelvis. Nonobstructive bowel-gas pattern. No abnormal abdominal calcifications. No appendicolith. The low pelvis is excluded. No gross free intraperitoneal air. Mild convex left lumbar spine curvature.  IMPRESSION: No acute findings.   Electronically Signed By: Abigail Miyamoto M.D. On: 11/16/2021 14:23  No results found for this or any previous visit.  No results found for this or any previous visit.  No results found for this or any previous visit.  No results found for this or any previous visit.  No valid procedures specified. No results found for this or any previous visit.  Results for orders placed during the hospital encounter of 01/23/22  CT RENAL STONE STUDY  Narrative CLINICAL DATA:  Lower back pain and flank pain.  EXAM: CT ABDOMEN AND PELVIS WITHOUT CONTRAST  TECHNIQUE: Multidetector CT imaging of the abdomen and pelvis was performed following the standard protocol without IV contrast.  RADIATION DOSE REDUCTION: This exam was performed according to the departmental dose-optimization program  which includes automated exposure control, adjustment of the mA and/or kV according to patient size and/or use of iterative reconstruction technique.  COMPARISON:  CT abdomen and pelvis 03/12/2021  FINDINGS: Lower chest: No acute abnormality.  Hepatobiliary: No focal liver abnormality is seen. No gallstones, gallbladder wall thickening, or biliary dilatation.  Pancreas: Unremarkable. No pancreatic ductal dilatation or surrounding inflammatory changes.  Spleen: Normal in size without focal abnormality.  Adrenals/Urinary Tract: There is a 4 mm calculus in the distal left ureter  image 2/74. There is no hydronephrosis. No additional urinary tract calculi are seen. Bladder and adrenal glands are within normal limits. There is a cyst in the inferior pole the left kidney measuring 3.5 cm.  Stomach/Bowel: Stomach is within normal limits. Appendix appears normal. No evidence of bowel wall thickening, distention, or inflammatory changes. There is sigmoid colon diverticulosis.  Vascular/Lymphatic: There are atherosclerotic calcifications of the aorta. Mild stranding surrounding the abdominal aorta, decreased from prior. There are atherosclerotic calcifications of the aorta. No discrete enlarged lymph nodes are identified.  Reproductive: Enlarged, unchanged.  Other: No ascites.  Small fat containing umbilical hernias.  Musculoskeletal: Multilevel degenerative changes affect the spine  IMPRESSION: 1. 4 mm calculus in the distal left ureter.  No hydronephrosis. 2. Stranding surrounding the aorta has decreased. Findings may represent vasculitis or retroperitoneal fibrosis. No enlarged lymph nodes. 3. Colonic diverticulosis without evidence for diverticulitis. 4.  Aortic Atherosclerosis (ICD10-I70.0).   Electronically Signed By: Ronney Asters M.D. On: 01/23/2022 22:01   Assessment & Plan:    1. Kidney stones -We discussed the management of kidney stones. These options include  observation, ureteroscopy, shockwave lithotripsy (ESWL) and percutaneous nephrolithotomy (PCNL). We discussed which options are relevant to the patient's stone(s). We discussed the natural history of kidney stones as well as the complications of untreated stones and the impact on quality of life without treatment as well as with each of the above listed treatments. We also discussed the efficacy of each treatment in its ability to clear the stone burden. With any of these management options I discussed the signs and symptoms of infection and the need for emergent treatment should these be experienced. For each option we discussed the ability of each procedure to clear the patient of their stone burden.   For observation I described the risks which include but are not limited to silent renal damage, life-threatening infection, need for emergent surgery, failure to pass stone and pain.   For ureteroscopy I described the risks which include bleeding, infection, damage to contiguous structures, positioning injury, ureteral stricture, ureteral avulsion, ureteral injury, need for prolonged ureteral stent, inability to perform ureteroscopy, need for an interval procedure, inability to clear stone burden, stent discomfort/pain, heart attack, stroke, pulmonary embolus and the inherent risks with general anesthesia.   For shockwave lithotripsy I described the risks which include arrhythmia, kidney contusion, kidney hemorrhage, need for transfusion, pain, inability to adequately break up stone, inability to pass stone fragments, Steinstrasse, infection associated with obstructing stones, need for alternate surgical procedure, need for repeat shockwave lithotripsy, MI, CVA, PE and the inherent risks with anesthesia/conscious sedation.   For PCNL I described the risks including positioning injury, pneumothorax, hydrothorax, need for chest tube, inability to clear stone burden, renal laceration, arterial venous fistula or  malformation, need for embolization of kidney, loss of kidney or renal function, need for repeat procedure, need for prolonged nephrostomy tube, ureteral avulsion, MI, CVA, PE and the inherent risks of general anesthesia.   - The patient would like to proceed with left ESWL - Urinalysis, Routine w reflex microscopic   No follow-ups on file.  [name], MD  [facility]

## 2022-10-01 NOTE — Patient Instructions (Signed)
ESWL for Kidney Stones  Extracorporeal shock wave lithotripsy (ESWL) is a treatment that can help break up kidney stones that are too large to pass on their own.  This is a nonsurgical procedure that breaks up a kidney stone with shock waves. These shock waves pass through your body and focus on the kidney stone. They cause the kidney stone to break into smaller pieces (fragments) while it is still in the urinary tract. The fragments of stone can pass more easily out of your body in the urine. Tell a health care provider about: Any allergies you have. All medicines you are taking, including vitamins, herbs, eye drops, creams, and over-the-counter medicines. Any problems you or family members have had with anesthetic medicines. Any bleeding problems you have. Any surgeries you have had. Any medical conditions you have. Whether you are pregnant or may be pregnant. What are the risks? Your health care provider will talk with you about risks. These may include: Infection. Bleeding from the kidney. Bruising of the kidney or skin. Scarring of the kidney. This can lead to: Increased blood pressure. Poor kidney function. Return (recurrence) of kidney stones. Damage to other structures or organs. This may include the liver, colon, spleen, or pancreas. Blockage (obstruction) of the tube that carries urine from the kidney to the bladder (ureter). Failure of the kidney stone to break into fragments. What happens before the procedure? When to stop eating and drinking Follow instructions from your health care provider about what you may eat and drink. These may include: 8 hours before your procedure Stop eating most foods. Do not eat meat, fried foods, or fatty foods. Eat only light foods, such as toast or crackers. All liquids are okay except energy drinks and alcohol. 6 hours before your procedure Stop eating. Drink only clear liquids, such as water, clear fruit juice, black coffee, plain tea,  and sports drinks. Do not drink energy drinks or alcohol. 2 hours before your procedure Stop drinking all liquids. You may be allowed to take medicines with small sips of water. If you do not follow your health care provider's instructions, your procedure may be delayed or canceled. Medicines Ask your health care provider about: Changing or stopping your regular medicines. These include any diabetes medicines or blood thinners you take. Taking medicines such as aspirin and ibuprofen. These medicines can thin your blood. Do not take them unless your health care provider tells you to. Taking over-the-counter medicines, vitamins, herbs, and supplements. Tests You may have tests, such as: Blood tests. Urine tests. Imaging tests. This may include a CT scan. Surgery safety Ask your health care provider: How your surgery site will be marked. What steps will be taken to help prevent infection. These steps may include: Washing skin with a soap that kills germs. Receiving antibiotics. General instructions If you will be going home right after the procedure, plan to have a responsible adult: Take you home from the hospital or clinic. You will not be allowed to drive. Care for you for the time you are told. What happens during the procedure?  An IV will be inserted into one of your veins. You may be given: A sedative. This helps you relax. Anesthesia. This will: Numb certain areas of your body. Make you fall asleep for surgery. A water-filled cushion may be placed behind your kidney or on your abdomen. In some cases, you may be placed in a tub of lukewarm water. Your body will be positioned in a way that makes it   easier to target the kidney stone. An X-ray or ultrasound exam will be done to locate your stone. Shock waves will be aimed at the stone. If you are awake, you may feel a tapping sensation as the shock waves pass through your body. A small mesh tube (stent) may be placed in your  ureter. This will help keep urine flowing from the kidney if the fragments of the stone have been blocking the ureter. The stent will be removed at a later time by your health care provider. The procedure may vary among health care providers and hospitals. What happens after the procedure? Your blood pressure, heart rate, breathing rate, and blood oxygen level will be monitored until you leave the hospital or clinic. You may have an X-ray after the procedure to see how many of the kidney stones were broken up. This will also show how much of the stone has passed. If there are still large fragments after treatment, you may need to have a second procedure at a later time. This information is not intended to replace advice given to you by your health care provider. Make sure you discuss any questions you have with your health care provider. Document Revised: 11/05/2021 Document Reviewed: 11/05/2021 Elsevier Patient Education  2023 [employer]  

## 2022-10-01 NOTE — H&P (View-Only) (Signed)
10/01/2022 1:01 PM   Bradley [name] [birth year] [id number]  Referring provider: Kathyrn Drown, MD [address] [place],  [place] [address]  nephrolithiasis   HPI: Bradley [name] is a 77yo here for evaluation of nephrolithiasis. He had not passed his original 4-65mm left calculus from 01/2022. He developed severe left flank pain and presented to the ER 09/13/2022 showed a 99mm left distal ureteral calculus. He has urinary frequency and urgency. KUB today shows left distal ureteral calculus.    PMH: 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HCC)    Shingles    hx of, on back of neck   Sleep apnea    Noncompliant with CPAP   Trigeminal neuralgia     Surgical History: Past Surgical History:  Procedure Laterality Date   BIOPSY  08/02/2018   Procedure: BIOPSY;  Surgeon: Daneil Dolin, MD;  [facility];  Service: Endoscopy;;  gastric polyp   BIOPSY  11/05/2021   Procedure: BIOPSY;  Surgeon: Daneil Dolin, MD;  [facility];  Service: Endoscopy;;   COLONOSCOPY     COLONOSCOPY  12/09/2004   [id number] polyp at 35 cm biopsied/normal rectum   COLONOSCOPY   05/20/2008   [id number] midsigmoid s/p snare removal/normal rectum. Tubular adenoma   COLONOSCOPY N/A 08/02/2018   Procedure: COLONOSCOPY;  Surgeon: Daneil Dolin, MD;  [facility]: [facility];  Service: Endoscopy;  Laterality: N/A;  1:15pm   COLONOSCOPY WITH ESOPHAGOGASTRODUODENOSCOPY (EGD) N/A 03/22/2013   Dr. Gala Romney: Barrett's esophagus status post segmental biopsies with no evidence of dysplasia.  Normal colonoscopy.  Next colonoscopy in 5 years.   ESOPHAGOGASTRODUODENOSCOPY  07/01/2003   [id number] colored epithelium involving essentially nearly the distal onehalf of the tubular esophagus/Patulous esophagogastric  junction/ Moderate sized hiatal hernia/Otherwise normal stomach, normal D1 and D2.   ESOPHAGOGASTRODUODENOSCOPY  12/09/2004   [id number] epithelium  beginning at 33 cm consistent with previous diagnosis of Barrett's esophagus  segmentally biopsied. Hiatal hernia. Otherwise normal stomach. Normal D1 and D2   ESOPHAGOGASTRODUODENOSCOPY   05/20/2008   [id number] epithelium distal esophagus consistent with Barrett's esophagus.  No significant change from prior exam. Multiple biopsies negative for dysplasia.   ESOPHAGOGASTRODUODENOSCOPY N/A 08/02/2018   Procedure: ESOPHAGOGASTRODUODENOSCOPY (EGD);  Surgeon: Daneil Dolin, MD;  [facility];  Service: Endoscopy;  Laterality: N/A;   ESOPHAGOGASTRODUODENOSCOPY (EGD) WITH PROPOFOL N/A 11/05/2021   Procedure: ESOPHAGOGASTRODUODENOSCOPY (EGD) WITH PROPOFOL;  Surgeon: Daneil Dolin, MD;  [facility];  Service: Endoscopy;  Laterality: N/A;  8:15am   POLYPECTOMY  08/02/2018   Procedure: POLYPECTOMY;  Surgeon: Daneil Dolin, MD;  [facility];  Service: Endoscopy;;  distal transverse colon(CSx1), rectal(CSx1)   TONSILLECTOMY      Home Medications:  Allergies as of 10/01/2022   No Known Allergies      Medication List        Accurate as of October 01, 2022  1:01 PM. If you have any questions, ask your nurse or doctor.          allopurinol 100 MG tablet Commonly known as: ZYLOPRIM Take 100 mg by mouth daily after breakfast.   ALPRAZolam 1 MG tablet Commonly known as: XANAX Take 1 mg by mouth 2 (two) times  daily.   aspirin EC 81 MG tablet Take 81 mg by mouth at bedtime.   atorvastatin 40 MG tablet Commonly known as: LIPITOR Take 40 mg by mouth every evening.   carbamazepine 200 MG tablet Commonly known as: TEGRETOL Take 1 tablet (200 mg total) by mouth 2 (two) times daily as needed (neuralgia).   ciprofloxacin 500 MG tablet Commonly known as: Cipro Take 1 tablet (500 mg total) by mouth  2 (two) times daily.   cyanocobalamin 1000 MCG/ML injection Commonly known as: VITAMIN B12 Inject 1,000 mcg into the muscle every 30 (thirty) days.   cyclobenzaprine 5 MG tablet Commonly known as: FLEXERIL 1 qhs prn back pain caution drowsiness   diphenhydrAMINE 25 MG tablet Commonly known as: BENADRYL Take 25 mg by mouth every 6 (six) hours as needed for allergies or sleep.   furosemide 20 MG tablet Commonly known as: LASIX Take 20 mg by mouth daily as needed for fluid or edema.   gabapentin 300 MG capsule Commonly known as: NEURONTIN Take 3 capsules (900 mg total) by mouth 2 (two) times daily. What changed:  how much to take when to take this   HYDROcodone-acetaminophen 5-325 MG tablet Commonly known as: NORCO/VICODIN Take 1 tablet by mouth every 6 (six) hours as needed.   lisinopril 40 MG tablet Commonly known as: ZESTRIL Take 40 mg by mouth daily as needed (high blood pressure).   naproxen sodium 275 MG tablet Commonly known as: ANAPROX Take 275 mg by mouth 2 (two) times daily as needed for moderate pain.   omeprazole 20 MG capsule Commonly known as: PRILOSEC Take 20 mg by mouth 2 (two) times daily before a meal.   ondansetron 4 MG disintegrating tablet Commonly known as: ZOFRAN-ODT Take 1 tablet (4 mg total) by mouth every 8 (eight) hours as needed for nausea.   oxyCODONE-acetaminophen 5-325 MG tablet Commonly known as: Percocet Take 1 tablet by mouth every 4 (four) hours as needed.   phenazopyridine 100 MG tablet Commonly known as: Pyridium Take one po TID prn bladder spasms   pyridostigmine 60 MG tablet Commonly known as: MESTINON Take 1 tablet (60 mg total) by mouth 2 (two) times daily. What changed: when to take this   Sodium Fluoride 5000 PPM 1.1 % Pste Generic drug: Sodium Fluoride Place 1 application. onto teeth at bedtime.   tamsulosin 0.4 MG Caps capsule Commonly known as: FLOMAX 2 qhs for BPH What changed:  how much to take how to take  this when to take this   Vitamin D (Ergocalciferol) 1.25 MG (50000 UNIT) Caps capsule Commonly known as: DRISDOL Take 1 capsule (50,000 Units total) by mouth every 7 (seven) days.        Allergies: No Known Allergies  Family History: Family History  Problem Relation Age of Onset   Heart attack Father 54       First MI in 5s   Cancer Brother        prostate   Colon cancer Mother        age greater than 66s   Cancer Sister        brain   COPD Brother     Social History:  reports that he has never smoked. He has never used smokeless tobacco. He reports that he does not drink alcohol and does not use drugs.  ROS: All other review of systems were reviewed and are negative except what is noted above in HPI  Physical Exam: BP 137/76   Pulse 61  Constitutional:  Alert and oriented, No acute distress. HEENT: [place] AT, moist mucus membranes.  Trachea midline, no masses. Cardiovascular: No clubbing, cyanosis, or edema. Respiratory: Normal respiratory effort, no increased work of breathing. GI: Abdomen is soft, nontender, nondistended, no abdominal masses GU: No CVA tenderness.  Lymph: No cervical or inguinal lymphadenopathy. Skin: No rashes, bruises or suspicious lesions. Neurologic: Grossly intact, no focal deficits, moving all 4 extremities. Psychiatric: Normal mood and affect.  Laboratory Data: Lab Results  Component Value Date   WBC 10.8 (H) 09/13/2022   HGB 15.8 09/13/2022   HCT 46.7 09/13/2022   MCV 91.7 09/13/2022   PLT 228 09/13/2022    Lab Results  Component Value Date   CREATININE 1.27 (H) 09/13/2022    No results found for: "PSA"  No results found for: "TESTOSTERONE"  Lab Results  Component Value Date   HGBA1C 5.6 07/23/2016    Urinalysis    Component Value Date/Time   COLORURINE STRAW (A) 09/13/2022 2205   APPEARANCEUR CLEAR 09/13/2022 2205   APPEARANCEUR Clear 01/29/2022 1208   LABSPEC 1.036 (H) 09/13/2022 2205   PHURINE 7.0 09/13/2022  2205   GLUCOSEU NEGATIVE 09/13/2022 2205   HGBUR LARGE (A) 09/13/2022 2205   BILIRUBINUR NEGATIVE 09/13/2022 2205   BILIRUBINUR Negative 01/29/2022 1208   KETONESUR 5 (A) 09/13/2022 2205   PROTEINUR NEGATIVE 09/13/2022 2205   UROBILINOGEN negative (A) 11/16/2021 1324   UROBILINOGEN 0.2 08/07/2012 1021   NITRITE NEGATIVE 09/13/2022 2205   LEUKOCYTESUR NEGATIVE 09/13/2022 2205    Lab Results  Component Value Date   LABMICR See below: 01/29/2022   WBCUA 6-10 (A) 01/29/2022   LABEPIT 0-10 01/29/2022   MUCUS Present 01/29/2022   BACTERIA RARE (A) 09/13/2022    Pertinent Imaging: KUb today: Images  Results for orders placed in visit on 11/16/21  DG Abd 1 View  Narrative CLINICAL DATA:  Lower abdominal pain. History kidney stones. Clinical constipation.  EXAM: ABDOMEN - 1 VIEW  COMPARISON:  03/12/2021  FINDINGS: Two supine views of the abdomen and pelvis. Nonobstructive bowel-gas pattern. No abnormal abdominal calcifications. No appendicolith. The low pelvis is excluded. No gross free intraperitoneal air. Mild convex left lumbar spine curvature.  IMPRESSION: No acute findings.   Electronically Signed By: Abigail Miyamoto M.D. On: 11/16/2021 14:23  No results found for this or any previous visit.  No results found for this or any previous visit.  No results found for this or any previous visit.  No results found for this or any previous visit.  No valid procedures specified. No results found for this or any previous visit.  Results for orders placed during the hospital encounter of 01/23/22  CT RENAL STONE STUDY  Narrative CLINICAL DATA:  Lower back pain and flank pain.  EXAM: CT ABDOMEN AND PELVIS WITHOUT CONTRAST  TECHNIQUE: Multidetector CT imaging of the abdomen and pelvis was performed following the standard protocol without IV contrast.  RADIATION DOSE REDUCTION: This exam was performed according to the departmental dose-optimization program  which includes automated exposure control, adjustment of the mA and/or kV according to patient size and/or use of iterative reconstruction technique.  COMPARISON:  CT abdomen and pelvis 03/12/2021  FINDINGS: Lower chest: No acute abnormality.  Hepatobiliary: No focal liver abnormality is seen. No gallstones, gallbladder wall thickening, or biliary dilatation.  Pancreas: Unremarkable. No pancreatic ductal dilatation or surrounding inflammatory changes.  Spleen: Normal in size without focal abnormality.  Adrenals/Urinary Tract: There is a 4 mm calculus in the distal left ureter  image 2/74. There is no hydronephrosis. No additional urinary tract calculi are seen. Bladder and adrenal glands are within normal limits. There is a cyst in the inferior pole the left kidney measuring 3.5 cm.  Stomach/Bowel: Stomach is within normal limits. Appendix appears normal. No evidence of bowel wall thickening, distention, or inflammatory changes. There is sigmoid colon diverticulosis.  Vascular/Lymphatic: There are atherosclerotic calcifications of the aorta. Mild stranding surrounding the abdominal aorta, decreased from prior. There are atherosclerotic calcifications of the aorta. No discrete enlarged lymph nodes are identified.  Reproductive: Enlarged, unchanged.  Other: No ascites.  Small fat containing umbilical hernias.  Musculoskeletal: Multilevel degenerative changes affect the spine  IMPRESSION: 1. 4 mm calculus in the distal left ureter.  No hydronephrosis. 2. Stranding surrounding the aorta has decreased. Findings may represent vasculitis or retroperitoneal fibrosis. No enlarged lymph nodes. 3. Colonic diverticulosis without evidence for diverticulitis. 4.  Aortic Atherosclerosis (ICD10-I70.0).   Electronically Signed By: Ronney Asters M.D. On: 01/23/2022 22:01   Assessment & Plan:    1. Kidney stones -We discussed the management of kidney stones. These options include  observation, ureteroscopy, shockwave lithotripsy (ESWL) and percutaneous nephrolithotomy (PCNL). We discussed which options are relevant to the patient's stone(s). We discussed the natural history of kidney stones as well as the complications of untreated stones and the impact on quality of life without treatment as well as with each of the above listed treatments. We also discussed the efficacy of each treatment in its ability to clear the stone burden. With any of these management options I discussed the signs and symptoms of infection and the need for emergent treatment should these be experienced. For each option we discussed the ability of each procedure to clear the patient of their stone burden.   For observation I described the risks which include but are not limited to silent renal damage, life-threatening infection, need for emergent surgery, failure to pass stone and pain.   For ureteroscopy I described the risks which include bleeding, infection, damage to contiguous structures, positioning injury, ureteral stricture, ureteral avulsion, ureteral injury, need for prolonged ureteral stent, inability to perform ureteroscopy, need for an interval procedure, inability to clear stone burden, stent discomfort/pain, heart attack, stroke, pulmonary embolus and the inherent risks with general anesthesia.   For shockwave lithotripsy I described the risks which include arrhythmia, kidney contusion, kidney hemorrhage, need for transfusion, pain, inability to adequately break up stone, inability to pass stone fragments, Steinstrasse, infection associated with obstructing stones, need for alternate surgical procedure, need for repeat shockwave lithotripsy, MI, CVA, PE and the inherent risks with anesthesia/conscious sedation.   For PCNL I described the risks including positioning injury, pneumothorax, hydrothorax, need for chest tube, inability to clear stone burden, renal laceration, arterial venous fistula or  malformation, need for embolization of kidney, loss of kidney or renal function, need for repeat procedure, need for prolonged nephrostomy tube, ureteral avulsion, MI, CVA, PE and the inherent risks of general anesthesia.   - The patient would like to proceed with left ESWL - Urinalysis, Routine w reflex microscopic   No follow-ups on file.  [name], MD  [facility] [place]

## 2022-10-04 ENCOUNTER — Encounter (HOSPITAL_COMMUNITY): Payer: Self-pay

## 2022-10-04 ENCOUNTER — Encounter (HOSPITAL_COMMUNITY)
Admission: RE | Admit: 2022-10-04 | Discharge: 2022-10-04 | Disposition: A | Discharge status: Home / Self Care | Payer: Medicare HMO | Source: Ambulatory Visit | Attending: Urology | Admitting: Urology

## 2022-10-04 ENCOUNTER — Other Ambulatory Visit: Payer: Self-pay

## 2022-10-04 HISTORY — DX: Cerebral infarction, unspecified: I63.9

## 2022-10-05 ENCOUNTER — Ambulatory Visit (HOSPITAL_COMMUNITY): Payer: Medicare HMO

## 2022-10-05 ENCOUNTER — Ambulatory Visit (HOSPITAL_COMMUNITY)
Admission: RE | Admit: 2022-10-05 | Discharge: 2022-10-05 | Disposition: A | Discharge status: Home / Self Care | Payer: Medicare HMO | Source: Ambulatory Visit | Attending: Urology | Admitting: Urology

## 2022-10-05 ENCOUNTER — Encounter (HOSPITAL_COMMUNITY)
Admission: RE | Disposition: A | Discharge status: Home / Self Care | Payer: Self-pay | Source: Ambulatory Visit | Attending: Urology

## 2022-10-05 ENCOUNTER — Encounter (HOSPITAL_COMMUNITY): Payer: Self-pay | Admitting: Urology

## 2022-10-05 DIAGNOSIS — Z8673 Personal history of transient ischemic attack (TIA), and cerebral infarction without residual deficits: Secondary | ICD-10-CM | POA: Insufficient documentation

## 2022-10-05 DIAGNOSIS — Z7982 Long term (current) use of aspirin: Secondary | ICD-10-CM | POA: Insufficient documentation

## 2022-10-05 DIAGNOSIS — N201 Calculus of ureter: Secondary | ICD-10-CM | POA: Insufficient documentation

## 2022-10-05 DIAGNOSIS — N200 Calculus of kidney: Secondary | ICD-10-CM

## 2022-10-05 HISTORY — PX: EXTRACORPOREAL SHOCK WAVE LITHOTRIPSY: SHX1557

## 2022-10-05 SURGERY — LITHOTRIPSY, ESWL
Anesthesia: LOCAL | Laterality: Left

## 2022-10-05 MED ORDER — DIAZEPAM 5 MG PO TABS
10.0000 mg | ORAL_TABLET | Freq: Once | ORAL | Status: AC
  Administered 2022-10-05: 10 mg via ORAL
  Filled 2022-10-05: qty 2

## 2022-10-05 MED ORDER — OXYCODONE-ACETAMINOPHEN 5-325 MG PO TABS: 1.0000 | ORAL_TABLET | ORAL | 0 refills | Status: AC | PRN

## 2022-10-05 MED ORDER — ONDANSETRON 4 MG PO TBDP: 4.0000 mg | ORAL_TABLET | Freq: Three times a day (TID) | ORAL | 0 refills | Status: AC | PRN

## 2022-10-05 MED ORDER — DIPHENHYDRAMINE HCL 25 MG PO CAPS
25.0000 mg | ORAL_CAPSULE | ORAL | Status: AC
  Administered 2022-10-05: 25 mg via ORAL
  Filled 2022-10-05: qty 1

## 2022-10-05 MED ORDER — SODIUM CHLORIDE 0.9 % IV SOLN: INTRAVENOUS | Status: DC

## 2022-10-05 NOTE — Interval H&P Note (Signed)
History and Physical Interval Note:  10/05/2022 11:15 AM  Bradley [name]  has presented today for surgery, with the diagnosis of left ureteral calculus.  The various methods of treatment have been discussed with the patient and family. After consideration of risks, benefits and other options for treatment, the patient has consented to  Procedure(s): EXTRACORPOREAL SHOCK WAVE LITHOTRIPSY (ESWL) (Left) as a surgical intervention.  The patient's history has been reviewed, patient examined, no change in status, stable for surgery.  I have reviewed the patient's chart and labs.  Questions were answered to the patient's satisfaction.     [name]

## 2022-10-06 ENCOUNTER — Telehealth: Payer: Self-pay | Admitting: Family Medicine

## 2022-10-06 ENCOUNTER — Encounter (HOSPITAL_COMMUNITY): Payer: Self-pay | Admitting: Urology

## 2022-10-06 NOTE — Telephone Encounter (Signed)
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  In the situations we recommend stopping aspirin 7 days prior to surgery and then if there is no bleeding issues may restart the aspirin 2 days after surgery  He should be aware that there is a small theoretic risk that being off the aspirin could increase the possibility of him having a stroke.  The likelihood of having a stroke during this time is not high but it is a slight increased risk.  On the other side of the coin he cannot have surgery without stopping the aspirin.  So in the situations the benefit of surgery outweighs the risk of being off of aspirin.  But it is important for him to agree to the above documentation.  If he does not agree with this I recommend that he set up an office visit to come in and discuss this.  If he does agree with this I recommend documenting that he does agree with the above then we can send then the presurgical assessment form  Please talk with the patient Document accordingly And send me what the outcome is Thank you-Dr. Sallee Lange

## 2022-10-07 NOTE — Telephone Encounter (Signed)
Sounds good thank you

## 2022-10-07 NOTE — Telephone Encounter (Signed)
Called and left message for patient to call office.

## 2022-10-07 NOTE — Telephone Encounter (Signed)
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  In the situations we recommend stopping aspirin 7 days prior to surgery and then if there is no bleeding issues may restart the aspirin 2 days after surgery   He should be aware that there is a small theoretic risk that being off the aspirin could increase the possibility of him having a stroke.  The likelihood of having a stroke during this time is not high but it is a slight increased risk.   On the other side of the coin he cannot have surgery without stopping the aspirin.  So in the situations the benefit of surgery outweighs the risk of being off of aspirin.   But it is important for him to agree to the above documentation.  If he does not agree with this I recommend that he set up an office visit to come in and discuss this.  If he does agree with this I recommend documenting that he does agree with the above then we can send then the presurgical assessment form   Please talk with the patient Document accordingly And send me what the outcome is Thank you-Dr. Sallee Lange    Patient verbalized understanding of risks and agrees with Dr Bary Leriche recommendations of  stopping aspirin 7 days prior to surgery and then if there is no bleeding issues may restart the aspirin 2 days after surgery. Patient stated he has already stopped taking the Aspirin and surgery is scheduled for Tuesday.

## 2022-10-13 ENCOUNTER — Other Ambulatory Visit: Payer: Self-pay

## 2022-10-31 ENCOUNTER — Telehealth: Payer: Self-pay | Admitting: Family Medicine

## 2022-10-31 NOTE — Telephone Encounter (Signed)
I received blood work from the [place].  Glucose 90.  Sodium potassium normal.  GFR 60.  Creatinine 1.08, hemoglobin 14.4, hematocrit 42.1, platelets 187, RDW 13.3, total protein 6, albumin 3.4, G OT 43, ALT 27, HDL 39, LDL 46, cholesterol 99, triglycerides 72, TSH 1.320, PSA-not drawn on this patient has regular follow-up visit with [place] later this year

## 2022-11-05 ENCOUNTER — Encounter: Payer: Self-pay | Admitting: Urology

## 2022-11-05 ENCOUNTER — Ambulatory Visit (INDEPENDENT_AMBULATORY_CARE_PROVIDER_SITE_OTHER): Payer: Medicare HMO | Admitting: Urology

## 2022-11-05 ENCOUNTER — Ambulatory Visit (HOSPITAL_COMMUNITY)
Admission: RE | Admit: 2022-11-05 | Discharge: 2022-11-05 | Disposition: A | Discharge status: Home / Self Care | Payer: Medicare HMO | Source: Ambulatory Visit | Attending: Urology | Admitting: Urology

## 2022-11-05 VITALS — BP 119/74 | HR 61

## 2022-11-05 DIAGNOSIS — N200 Calculus of kidney: Secondary | ICD-10-CM

## 2022-11-05 DIAGNOSIS — Z87442 Personal history of urinary calculi: Secondary | ICD-10-CM

## 2022-11-05 DIAGNOSIS — Z09 Encounter for follow-up examination after completed treatment for conditions other than malignant neoplasm: Secondary | ICD-10-CM

## 2022-11-05 NOTE — Progress Notes (Signed)
11/05/2022 10:34 AM   Bradley [name] [birth year] [id number]  Referring provider: Babs Sciara, MD [address] [place],  [place] [address]  No chief complaint on file.   HPI:    PMH: 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HCC)    Shingles    hx of, on back of neck   Sleep apnea    Noncompliant with CPAP   Stroke (HCC)    only affected left eye   Trigeminal neuralgia     Surgical History: Past Surgical History:  Procedure Laterality Date   BIOPSY  08/02/2018   Procedure: BIOPSY;  Surgeon: Corbin Ade, MD;  [facility]: [facility];  Service: Endoscopy;;  gastric polyp   BIOPSY  11/05/2021   Procedure: BIOPSY;  Surgeon: Corbin Ade, MD;  [facility];  Service: Endoscopy;;   COLONOSCOPY     COLONOSCOPY  12/09/2004   AOZ:HYQMVHQION polyp at 35 cm biopsied/normal rectum   COLONOSCOPY   05/20/2008   GEX:BMWUX midsigmoid s/p snare removal/normal rectum. Tubular adenoma   COLONOSCOPY N/A 08/02/2018   Procedure: COLONOSCOPY;  Surgeon: Corbin Ade, MD;  [facility];  Service: Endoscopy;  Laterality: N/A;  1:15pm   COLONOSCOPY WITH ESOPHAGOGASTRODUODENOSCOPY (EGD) N/A 03/22/2013   Dr. Jena Gauss: Barrett's esophagus status post segmental biopsies with no evidence of dysplasia.  Normal colonoscopy.  Next colonoscopy in 5 years.   ESOPHAGOGASTRODUODENOSCOPY  07/01/2003   LKG:MWNUUV colored epithelium involving essentially nearly the distal onehalf of the tubular esophagus/Patulous esophagogastric junction/ Moderate sized hiatal hernia/Otherwise normal stomach, normal D1 and D2.   ESOPHAGOGASTRODUODENOSCOPY  12/09/2004   OZD:GUYQIH-KVQQVZD epithelium  beginning at 33 cm consistent with previous diagnosis of Barrett's esophagus  segmentally biopsied. Hiatal  hernia. Otherwise normal stomach. Normal D1 and D2   ESOPHAGOGASTRODUODENOSCOPY   05/20/2008   GLO:VFIEPP-IRJJOAC epithelium distal esophagus consistent with Barrett's esophagus.  No significant change from prior exam. Multiple biopsies negative for dysplasia.   ESOPHAGOGASTRODUODENOSCOPY N/A 08/02/2018   Procedure: ESOPHAGOGASTRODUODENOSCOPY (EGD);  Surgeon: Corbin Ade, MD;  [facility];  Service: Endoscopy;  Laterality: N/A;   ESOPHAGOGASTRODUODENOSCOPY (EGD) WITH PROPOFOL N/A 11/05/2021   Procedure: ESOPHAGOGASTRODUODENOSCOPY (EGD) WITH PROPOFOL;  Surgeon: Corbin Ade, MD;  [facility];  Service: Endoscopy;  Laterality: N/A;  8:15am   EXTRACORPOREAL SHOCK WAVE LITHOTRIPSY Left 10/05/2022   Procedure: EXTRACORPOREAL SHOCK WAVE LITHOTRIPSY (ESWL);  Surgeon: Malen Gauze, MD;  Location: [facility];  Service: Urology;  Laterality: Left;   POLYPECTOMY  08/02/2018   Procedure: POLYPECTOMY;  Surgeon: Corbin Ade, MD;  [facility];  Service: Endoscopy;;  distal transverse colon(CSx1), rectal(CSx1)   TONSILLECTOMY      Home Medications:  Allergies as of 11/05/2022   No Known Allergies      Medication List        Accurate as of November 05, 2022 10:34 AM. If you have any questions, ask your nurse or doctor.          allopurinol 100 MG tablet Commonly known as: ZYLOPRIM Take 100 mg by mouth daily after breakfast.   ALPRAZolam 1 MG tablet Commonly known as: XANAX Take 1 mg by mouth 2 (two) times daily.   aspirin EC 81 MG  tablet Take 81 mg by mouth at bedtime.   atorvastatin 40 MG tablet Commonly known as: LIPITOR Take 40 mg by mouth every evening.   carbamazepine 200 MG tablet Commonly known as: TEGRETOL Take 1 tablet (200 mg total) by mouth 2 (two) times daily as needed (neuralgia).   ciprofloxacin 500 MG tablet Commonly known as: Cipro Take 1 tablet (500 mg total) by mouth 2 (two) times daily.   cyanocobalamin 1000 MCG/ML  injection Commonly known as: VITAMIN B12 Inject 1,000 mcg into the muscle every 30 (thirty) days.   cyclobenzaprine 5 MG tablet Commonly known as: FLEXERIL 1 qhs prn back pain caution drowsiness   diphenhydrAMINE 25 MG tablet Commonly known as: BENADRYL Take 25 mg by mouth every 6 (six) hours as needed for allergies or sleep.   furosemide 20 MG tablet Commonly known as: LASIX Take 20 mg by mouth daily as needed for fluid or edema.   gabapentin 300 MG capsule Commonly known as: NEURONTIN Take 3 capsules (900 mg total) by mouth 2 (two) times daily. What changed:  how much to take when to take this   HYDROcodone-acetaminophen 5-325 MG tablet Commonly known as: NORCO/VICODIN Take 1 tablet by mouth every 6 (six) hours as needed.   lisinopril 40 MG tablet Commonly known as: ZESTRIL Take 40 mg by mouth daily as needed (high blood pressure).   naproxen sodium 275 MG tablet Commonly known as: ANAPROX Take 275 mg by mouth 2 (two) times daily as needed for moderate pain.   omeprazole 20 MG capsule Commonly known as: PRILOSEC Take 20 mg by mouth 2 (two) times daily before a meal.   ondansetron 4 MG disintegrating tablet Commonly known as: ZOFRAN-ODT Take 1 tablet (4 mg total) by mouth every 8 (eight) hours as needed for nausea.   oxyCODONE-acetaminophen 5-325 MG tablet Commonly known as: Percocet Take 1 tablet by mouth every 4 (four) hours as needed.   phenazopyridine 100 MG tablet Commonly known as: Pyridium Take one po TID prn bladder spasms   pyridostigmine 60 MG tablet Commonly known as: MESTINON Take 1 tablet (60 mg total) by mouth 2 (two) times daily. What changed: when to take this   Sodium Fluoride 5000 PPM 1.1 % Pste Generic drug: Sodium Fluoride Place 1 application. onto teeth at bedtime.   tamsulosin 0.4 MG Caps capsule Commonly known as: FLOMAX 2 qhs for BPH What changed:  how much to take how to take this when to take this   Vitamin D  (Ergocalciferol) 1.25 MG (50000 UNIT) Caps capsule Commonly known as: DRISDOL Take 1 capsule (50,000 Units total) by mouth every 7 (seven) days.        Allergies: No Known Allergies  Family History: Family History  Problem Relation Age of Onset   Heart attack Father 81       First MI in 21s   Cancer Brother        prostate   Colon cancer Mother        age greater than 32s   Cancer Sister        brain   COPD Brother     Social History:  reports that he has never smoked. He has never used smokeless tobacco. He reports that he does not drink alcohol and does not use drugs.  ROS: All other review of systems were reviewed and are negative except what is noted above in HPI  Physical Exam: BP 119/74   Pulse 61   Constitutional:  Alert and oriented,  No acute distress. HEENT: [place] AT, moist mucus membranes.  Trachea midline, no masses. Cardiovascular: No clubbing, cyanosis, or edema. Respiratory: Normal respiratory effort, no increased work of breathing. GI: Abdomen is soft, nontender, nondistended, no abdominal masses GU: No CVA tenderness.  Lymph: No cervical or inguinal lymphadenopathy. Skin: No rashes, bruises or suspicious lesions. Neurologic: Grossly intact, no focal deficits, moving all 4 extremities. Psychiatric: Normal mood and affect.  Laboratory Data: Lab Results  Component Value Date   WBC 10.8 (H) 09/13/2022   HGB 15.8 09/13/2022   HCT 46.7 09/13/2022   MCV 91.7 09/13/2022   PLT 228 09/13/2022    Lab Results  Component Value Date   CREATININE 1.27 (H) 09/13/2022    No results found for: "PSA"  No results found for: "TESTOSTERONE"  Lab Results  Component Value Date   HGBA1C 5.6 07/23/2016    Urinalysis    Component Value Date/Time   COLORURINE STRAW (A) 09/13/2022 2205   APPEARANCEUR Cloudy (A) 10/01/2022 1216   LABSPEC 1.036 (H) 09/13/2022 2205   PHURINE 7.0 09/13/2022 2205   GLUCOSEU Negative 10/01/2022 1216   HGBUR LARGE (A) 09/13/2022  2205   BILIRUBINUR Negative 10/01/2022 1216   KETONESUR 5 (A) 09/13/2022 2205   PROTEINUR 2+ (A) 10/01/2022 1216   PROTEINUR NEGATIVE 09/13/2022 2205   UROBILINOGEN negative (A) 11/16/2021 1324   UROBILINOGEN 0.2 08/07/2012 1021   NITRITE Negative 10/01/2022 1216   NITRITE NEGATIVE 09/13/2022 2205   LEUKOCYTESUR 3+ (A) 10/01/2022 1216   LEUKOCYTESUR NEGATIVE 09/13/2022 2205    Lab Results  Component Value Date   LABMICR See below: 10/01/2022   WBCUA >30 (A) 10/01/2022   LABEPIT 0-10 10/01/2022   MUCUS Present 01/29/2022   BACTERIA Many (A) 10/01/2022    Pertinent Imaging: *** Results for orders placed during the hospital encounter of 10/05/22  DG Abd 1 View  Narrative CLINICAL DATA:  Kidney stones  EXAM: ABDOMEN - 1 VIEW  COMPARISON:  CT 09/13/2022  FINDINGS: 4 mm stone remains visible in a location consistent with the left UVJ. Cannot state with certainty that this is not passed into the bladder, but this is presumed to be at the UVJ. Small phleboliths otherwise within the pelvis as seen previously. No other sign of urinary tract stone disease. Bowel gas pattern is normal. Ordinary curvature and degenerative change of the spine.  IMPRESSION: 4 mm stone remains visible in a location consistent with the left UVJ. Cannot state that this is not passed into the bladder, but this is presumed to be at the UVJ.   Electronically Signed By: Paulina Fusi M.D. On: 10/05/2022 09:58  No results found for this or any previous visit.  No results found for this or any previous visit.  No results found for this or any previous visit.  No results found for this or any previous visit.  No valid procedures specified. No results found for this or any previous visit.  Results for orders placed during the hospital encounter of 01/23/22  CT RENAL STONE STUDY  Narrative CLINICAL DATA:  Lower back pain and flank pain.  EXAM: CT ABDOMEN AND PELVIS WITHOUT  CONTRAST  TECHNIQUE: Multidetector CT imaging of the abdomen and pelvis was performed following the standard protocol without IV contrast.  RADIATION DOSE REDUCTION: This exam was performed according to the departmental dose-optimization program which includes automated exposure control, adjustment of the mA and/or kV according to patient size and/or use of iterative reconstruction technique.  COMPARISON:  CT abdomen and  pelvis 03/12/2021  FINDINGS: Lower chest: No acute abnormality.  Hepatobiliary: No focal liver abnormality is seen. No gallstones, gallbladder wall thickening, or biliary dilatation.  Pancreas: Unremarkable. No pancreatic ductal dilatation or surrounding inflammatory changes.  Spleen: Normal in size without focal abnormality.  Adrenals/Urinary Tract: There is a 4 mm calculus in the distal left ureter image 2/74. There is no hydronephrosis. No additional urinary tract calculi are seen. Bladder and adrenal glands are within normal limits. There is a cyst in the inferior pole the left kidney measuring 3.5 cm.  Stomach/Bowel: Stomach is within normal limits. Appendix appears normal. No evidence of bowel wall thickening, distention, or inflammatory changes. There is sigmoid colon diverticulosis.  Vascular/Lymphatic: There are atherosclerotic calcifications of the aorta. Mild stranding surrounding the abdominal aorta, decreased from prior. There are atherosclerotic calcifications of the aorta. No discrete enlarged lymph nodes are identified.  Reproductive: Enlarged, unchanged.  Other: No ascites.  Small fat containing umbilical hernias.  Musculoskeletal: Multilevel degenerative changes affect the spine  IMPRESSION: 1. 4 mm calculus in the distal left ureter.  No hydronephrosis. 2. Stranding surrounding the aorta has decreased. Findings may represent vasculitis or retroperitoneal fibrosis. No enlarged lymph nodes. 3. Colonic diverticulosis without evidence  for diverticulitis. 4.  Aortic Atherosclerosis (ICD10-I70.0).   Electronically Signed By: Darliss Cheney M.D. On: 01/23/2022 22:01   Assessment & Plan:    1. Kidney stones -renal US 6-8 weeks -stone for analysis - Urinalysis, Routine w reflex microscopic   No follow-ups on file.  Wilkie Aye, MD  [facility]

## 2022-11-05 NOTE — Patient Instructions (Signed)

## 2022-11-12 LAB — CALCULI, WITH PHOTOGRAPH (CLINICAL LAB)
Calcium Oxalate Monohydrate: 100 %
Weight Calculi: 12 mg

## 2022-11-12 LAB — LAB REPORT - SCANNED
A1c: 5.6
EGFR: 72

## 2022-11-29 ENCOUNTER — Telehealth: Payer: Self-pay

## 2022-11-29 NOTE — Telephone Encounter (Signed)
Prescription Request  11/29/2022  LOV: Visit date not found  What is the name of the medication or equipment? tamsulosin (FLOMAX) 0.4 MG CAPS capsule   Have you contacted your pharmacy to request a refill? Yes   Which pharmacy would you like this sent to? [facility]    Patient notified that their request is being sent to the clinical staff for review and that they should receive a response within 2 business days.   Please advise at Mobile [phone] (mobile)

## 2022-11-30 ENCOUNTER — Other Ambulatory Visit: Payer: Self-pay | Admitting: Family Medicine

## 2022-11-30 MED ORDER — TAMSULOSIN HCL 0.4 MG PO CAPS: ORAL_CAPSULE | ORAL | 1 refills | Status: DC

## 2022-11-30 NOTE — Telephone Encounter (Signed)
Refill sent as requested. 

## 2022-12-02 ENCOUNTER — Ambulatory Visit: Payer: Medicare HMO | Admitting: Family Medicine

## 2022-12-02 VITALS — BP 134/88 | Ht 64.0 in | Wt 204.2 lb

## 2022-12-02 DIAGNOSIS — Z79899 Other long term (current) drug therapy: Secondary | ICD-10-CM | POA: Diagnosis not present

## 2022-12-02 DIAGNOSIS — R253 Fasciculation: Secondary | ICD-10-CM

## 2022-12-02 NOTE — Progress Notes (Signed)
   Subjective:    Patient ID: Bradley [name], male    DOB: Mar 18, 1946, 77 y.o.   MRN: [id number]  HPI He relates he is having a lot of twitching in his legs and his arms denies any burning numbness tingling denies weakness states this occurred after tapering off the gabapentin he states he really cannot see that gabapentin was helping  Denies any chest tightness pressure pain shortness of breath  Review of Systems     Objective:   Physical Exam General-in no acute distress Eyes-no discharge Lungs-respiratory rate normal, CTA CV-no murmurs,RRR Extremities skin warm dry no edema Neuro grossly normal Behavior normal, alert  No tremors noted Romberg negative balance good no ataxia no cogwheeling      Assessment & Plan:  1. Muscle twitching I find no evidence of any underlying Parkinson's or other degenerative muscle condition.  Continue current measures as is  He does use Xanax twice a day his wife is concerned about it he states it does not cause drowsiness and that helps him sleep he gets this through the [place] he has been on this for years I talked with him about how he work with his provider about seeing if he could be on less certainly that is a decision between his [place] provider and him but I did tell him that older adults to take benzodiazepines on a regular basis have not increased risk of accidental falls injuries as well as cognitive dysfunction issues if he was to reduce Xanax it would have to occur over months and he would gradually reduce the evening and daytime dose, patient is very nice he is skeptical about this and I encouraged him to talk with [facility]  Keep regular follow-up later this year

## 2022-12-03 ENCOUNTER — Ambulatory Visit (HOSPITAL_COMMUNITY)
Admission: RE | Admit: 2022-12-03 | Discharge: 2022-12-03 | Disposition: A | Discharge status: Home / Self Care | Payer: Medicare HMO | Source: Ambulatory Visit | Attending: Urology | Admitting: Urology

## 2022-12-03 DIAGNOSIS — N200 Calculus of kidney: Secondary | ICD-10-CM | POA: Diagnosis present

## 2022-12-10 ENCOUNTER — Ambulatory Visit: Payer: Medicare HMO | Admitting: Urology

## 2022-12-10 ENCOUNTER — Encounter: Payer: Self-pay | Admitting: Urology

## 2022-12-10 ENCOUNTER — Telehealth: Payer: Self-pay

## 2022-12-10 VITALS — BP 144/86 | HR 65

## 2022-12-10 DIAGNOSIS — N200 Calculus of kidney: Secondary | ICD-10-CM

## 2022-12-10 DIAGNOSIS — N201 Calculus of ureter: Secondary | ICD-10-CM

## 2022-12-10 LAB — URINALYSIS, ROUTINE W REFLEX MICROSCOPIC
Bilirubin, UA: NEGATIVE
Glucose, UA: NEGATIVE
Ketones, UA: NEGATIVE
Nitrite, UA: POSITIVE — AB
Protein,UA: NEGATIVE
Specific Gravity, UA: 1.025 (ref 1.005–1.030)
Urobilinogen, Ur: 0.2 mg/dL (ref 0.2–1.0)
pH, UA: 5.5 (ref 5.0–7.5)

## 2022-12-10 LAB — MICROSCOPIC EXAMINATION

## 2022-12-10 NOTE — Progress Notes (Unsigned)
12/10/2022 11:37 AM   Bradley [name] 1946/06/15 [id number]  Referring provider: Babs Sciara, MD [address] [place],  [place] [address]  Chief Complaint  Patient presents with   renal US     results    HPI: Bradley [name] is a 76yo here for followup for nephrolithiasis. No stone events sin ce last visit. He denies nay flank pan. No significant LUTS. NO other complaints today.    PMH: 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HCC)    Shingles    hx of, on back of neck   Sleep apnea    Noncompliant with CPAP   Stroke (HCC)    only affected left eye   Trigeminal neuralgia     Surgical History: Past Surgical History:  Procedure Laterality Date   BIOPSY  08/02/2018   Procedure: BIOPSY;  Surgeon: Bradley Ade, MD;  [facility];  Service: Endoscopy;;  gastric polyp   BIOPSY  11/05/2021   Procedure: BIOPSY;  Surgeon: Bradley Ade, MD;  [facility];  Service: Endoscopy;;   COLONOSCOPY     COLONOSCOPY  12/09/2004   XBJ:YNWGNFAOZH polyp at 35 cm biopsied/normal rectum   COLONOSCOPY   05/20/2008   YQM:VHQIO midsigmoid s/p snare removal/normal rectum. Tubular adenoma   COLONOSCOPY N/A 08/02/2018   Procedure: COLONOSCOPY;  Surgeon: Bradley Ade, MD;  [facility];  Service: Endoscopy;  Laterality: N/A;  1:15pm   COLONOSCOPY WITH ESOPHAGOGASTRODUODENOSCOPY (EGD) N/A 03/22/2013   Dr. Jena [name]: Barrett's esophagus status post segmental biopsies with no evidence of dysplasia.  Normal colonoscopy.  Next colonoscopy in 5 years.   ESOPHAGOGASTRODUODENOSCOPY  07/01/2003   NGE:XBMWUX colored epithelium involving essentially nearly the distal onehalf of the tubular esophagus/Patulous esophagogastric junction/ Moderate sized hiatal hernia/Otherwise normal stomach,  normal D1 and D2.   ESOPHAGOGASTRODUODENOSCOPY  12/09/2004   LKG:[id number] epithelium  beginning at 33 cm consistent with previous diagnosis of Barrett's esophagus  segmentally biopsied. Hiatal hernia. Otherwise normal stomach. Normal D1 and D2   ESOPHAGOGASTRODUODENOSCOPY   05/20/2008   IHK:[id number]-[id number] epithelium distal esophagus consistent with Barrett's esophagus.  No significant change from prior exam. Multiple biopsies negative for dysplasia.   ESOPHAGOGASTRODUODENOSCOPY N/A 08/02/2018   Procedure: ESOPHAGOGASTRODUODENOSCOPY (EGD);  Surgeon: Bradley Ade, MD;  [facility];  Service: Endoscopy;  Laterality: N/A;   ESOPHAGOGASTRODUODENOSCOPY (EGD) WITH PROPOFOL N/A 11/05/2021   Procedure: ESOPHAGOGASTRODUODENOSCOPY (EGD) WITH PROPOFOL;  Surgeon: Bradley Ade, MD;  [facility];  Service: Endoscopy;  Laterality: N/A;  8:15am   EXTRACORPOREAL SHOCK WAVE LITHOTRIPSY Left 10/05/2022   Procedure: EXTRACORPOREAL SHOCK WAVE LITHOTRIPSY (ESWL);  Surgeon: Bradley Gauze, MD;  Location: [facility];  Service: Urology;  Laterality: Left;   POLYPECTOMY  08/02/2018   Procedure: POLYPECTOMY;  Surgeon: Bradley Ade, MD;  [facility];  Service: Endoscopy;;  distal transverse colon(CSx1), rectal(CSx1)   TONSILLECTOMY      Home Medications:  Allergies as of 12/10/2022   No Known Allergies      Medication List        Accurate as of Dec 10, 2022 11:37 AM. If you have any questions, ask your nurse or doctor.          allopurinol  100 MG tablet Commonly known as: ZYLOPRIM Take 100 mg by mouth daily after breakfast.   ALPRAZolam 1 MG tablet Commonly known as: XANAX Take 1 mg by mouth 2 (two) times daily.   aspirin EC 81 MG tablet Take 81 mg by mouth at bedtime.   atorvastatin 40 MG tablet Commonly known as: LIPITOR Take 40 mg by mouth every evening.   benzonatate 100 MG capsule Commonly known as: TESSALON Take 1 capsule 3 times a day by oral  route for 10 days.   carbamazepine 200 MG tablet Commonly known as: TEGRETOL Take 1 tablet (200 mg total) by mouth 2 (two) times daily as needed (neuralgia).   cyanocobalamin 1000 MCG/ML injection Commonly known as: VITAMIN B12 Inject 1,000 mcg into the muscle every 30 (thirty) days.   cyclobenzaprine 5 MG tablet Commonly known as: FLEXERIL 1 qhs prn back pain caution drowsiness   diphenhydrAMINE 25 MG tablet Commonly known as: BENADRYL Take 25 mg by mouth every 6 (six) hours as needed for allergies or sleep.   furosemide 20 MG tablet Commonly known as: LASIX Take 20 mg by mouth daily as needed for fluid or edema.   gabapentin 300 MG capsule Commonly known as: NEURONTIN Take 3 capsules (900 mg total) by mouth 2 (two) times daily. What changed:  how much to take when to take this   HYDROcodone-acetaminophen 5-325 MG tablet Commonly known as: NORCO/VICODIN Take 1 tablet by mouth every 6 (six) hours as needed.   lisinopril 40 MG tablet Commonly known as: ZESTRIL Take 40 mg by mouth daily as needed (high blood pressure).   naproxen sodium 275 MG tablet Commonly known as: ANAPROX Take 275 mg by mouth 2 (two) times daily as needed for moderate pain.   omeprazole 20 MG capsule Commonly known as: PRILOSEC Take 20 mg by mouth 2 (two) times daily before a meal.   ondansetron 4 MG disintegrating tablet Commonly known as: ZOFRAN-ODT Take 1 tablet (4 mg total) by mouth every 8 (eight) hours as needed for nausea.   oxyCODONE-acetaminophen 5-325 MG tablet Commonly known as: Percocet Take 1 tablet by mouth every 4 (four) hours as needed.   phenazopyridine 100 MG tablet Commonly known as: Pyridium Take one po TID prn bladder spasms   pyridostigmine 60 MG tablet Commonly known as: MESTINON Take 1 tablet (60 mg total) by mouth 2 (two) times daily. What changed: when to take this   Sodium Fluoride 5000 PPM 1.1 % Pste Generic drug: Sodium Fluoride Place 1 application. onto  teeth at bedtime.   tamsulosin 0.4 MG Caps capsule Commonly known as: FLOMAX 2 qhs for BPH   Vitamin D (Ergocalciferol) 1.25 MG (50000 UNIT) Caps capsule Commonly known as: DRISDOL Take 1 capsule (50,000 Units total) by mouth every 7 (seven) days.        Allergies: No Known Allergies  Family History: Family History  Problem Relation Age of Onset   Heart attack Father 66       First MI in 65s   Cancer Brother        prostate   Colon cancer Mother        age greater than 73s   Cancer Sister        brain   COPD Brother     Social History:  reports that he has never smoked. He has never used smokeless tobacco. He reports that he does not drink alcohol and does not use drugs.  ROS: All other review of systems were reviewed  and are negative except what is noted above in HPI  Physical Exam: BP (!) 144/86   Pulse 65   Constitutional:  Alert and oriented, No acute distress. HEENT: [place] AT, moist mucus membranes.  Trachea midline, no masses. Cardiovascular: No clubbing, cyanosis, or edema. Respiratory: Normal respiratory effort, no increased work of breathing. GI: Abdomen is soft, nontender, nondistended, no abdominal masses GU: No CVA tenderness.  Lymph: No cervical or inguinal lymphadenopathy. Skin: No rashes, bruises or suspicious lesions. Neurologic: Grossly intact, no focal deficits, moving all 4 extremities. Psychiatric: Normal mood and affect.  Laboratory Data: Lab Results  Component Value Date   WBC 10.8 (H) 09/13/2022   HGB 15.8 09/13/2022   HCT 46.7 09/13/2022   MCV 91.7 09/13/2022   PLT 228 09/13/2022    Lab Results  Component Value Date   CREATININE 1.27 (H) 09/13/2022    No results found for: "PSA"  No results found for: "TESTOSTERONE"  Lab Results  Component Value Date   HGBA1C 5.6 07/23/2016    Urinalysis    Component Value Date/Time   COLORURINE STRAW (A) 09/13/2022 2205   APPEARANCEUR Cloudy (A) 10/01/2022 1216   LABSPEC 1.036 (H)  09/13/2022 2205   PHURINE 7.0 09/13/2022 2205   GLUCOSEU Negative 10/01/2022 1216   HGBUR LARGE (A) 09/13/2022 2205   BILIRUBINUR Negative 10/01/2022 1216   KETONESUR 5 (A) 09/13/2022 2205   PROTEINUR 2+ (A) 10/01/2022 1216   PROTEINUR NEGATIVE 09/13/2022 2205   UROBILINOGEN negative (A) 11/16/2021 1324   UROBILINOGEN 0.2 08/07/2012 1021   NITRITE Negative 10/01/2022 1216   NITRITE NEGATIVE 09/13/2022 2205   LEUKOCYTESUR 3+ (A) 10/01/2022 1216   LEUKOCYTESUR NEGATIVE 09/13/2022 2205    Lab Results  Component Value Date   LABMICR See below: 10/01/2022   WBCUA >30 (A) 10/01/2022   LABEPIT 0-10 10/01/2022   MUCUS Present 01/29/2022   BACTERIA Many (A) 10/01/2022    Pertinent Imaging: Renal 12/03/2022: Images reviewed and discussed with the patient  Results for orders placed during the hospital encounter of 10/05/22  DG Abd 1 View  Narrative CLINICAL DATA:  Kidney stones  EXAM: ABDOMEN - 1 VIEW  COMPARISON:  CT 09/13/2022  FINDINGS: 4 mm stone remains visible in a location consistent with the left UVJ. Cannot state with certainty that this is not passed into the bladder, but this is presumed to be at the UVJ. Small phleboliths otherwise within the pelvis as seen previously. No other sign of urinary tract stone disease. Bowel gas pattern is normal. Ordinary curvature and degenerative change of the spine.  IMPRESSION: 4 mm stone remains visible in a location consistent with the left UVJ. Cannot state that this is not passed into the bladder, but this is presumed to be at the UVJ.   Electronically Signed By: Paulina Fusi M.D. On: 10/05/2022 09:58  No results found for this or any previous visit.  No results found for this or any previous visit.  No results found for this or any previous visit.  Results for orders placed during the hospital encounter of 12/03/22  Ultrasound renal complete  Narrative CLINICAL DATA:  Nephrolithiasis follow-up.  EXAM: RENAL  / URINARY TRACT ULTRASOUND COMPLETE  COMPARISON:  None Available.  FINDINGS: Right Kidney:  Renal measurements: 10.8 x 5.8 x 5.1 cm = volume: 167 mL. Echogenicity within normal limits. No mass or hydronephrosis visualized.  Left Kidney:  Renal measurements: 13.2 x 4.9 x 5.6 cm = volume: 189 mL. Demonstrates a duplicated collecting system with  no obstruction. Contains a 3.4 cm cyst. No follow-up imaging recommended for the cyst.  Bladder:  Appears normal for degree of bladder distention.  Other:  None.  IMPRESSION: No significant abnormalities. No stones identified.   Electronically Signed By: Gerome Sam III M.D. On: 12/03/2022 13:13  No valid procedures specified. No results found for this or any previous visit.  Results for orders placed during the hospital encounter of 01/23/22  CT RENAL STONE STUDY  Narrative CLINICAL DATA:  Lower back pain and flank pain.  EXAM: CT ABDOMEN AND PELVIS WITHOUT CONTRAST  TECHNIQUE: Multidetector CT imaging of the abdomen and pelvis was performed following the standard protocol without IV contrast.  RADIATION DOSE REDUCTION: This exam was performed according to the departmental dose-optimization program which includes automated exposure control, adjustment of the mA and/or kV according to patient size and/or use of iterative reconstruction technique.  COMPARISON:  CT abdomen and pelvis 03/12/2021  FINDINGS: Lower chest: No acute abnormality.  Hepatobiliary: No focal liver abnormality is seen. No gallstones, gallbladder wall thickening, or biliary dilatation.  Pancreas: Unremarkable. No pancreatic ductal dilatation or surrounding inflammatory changes.  Spleen: Normal in size without focal abnormality.  Adrenals/Urinary Tract: There is a 4 mm calculus in the distal left ureter image 2/74. There is no hydronephrosis. No additional urinary tract calculi are seen. Bladder and adrenal glands are within  normal limits. There is a cyst in the inferior pole the left kidney measuring 3.5 cm.  Stomach/Bowel: Stomach is within normal limits. Appendix appears normal. No evidence of bowel wall thickening, distention, or inflammatory changes. There is sigmoid colon diverticulosis.  Vascular/Lymphatic: There are atherosclerotic calcifications of the aorta. Mild stranding surrounding the abdominal aorta, decreased from prior. There are atherosclerotic calcifications of the aorta. No discrete enlarged lymph nodes are identified.  Reproductive: Enlarged, unchanged.  Other: No ascites.  Small fat containing umbilical hernias.  Musculoskeletal: Multilevel degenerative changes affect the spine  IMPRESSION: 1. 4 mm calculus in the distal left ureter.  No hydronephrosis. 2. Stranding surrounding the aorta has decreased. Findings may represent vasculitis or retroperitoneal fibrosis. No enlarged lymph nodes. 3. Colonic diverticulosis without evidence for diverticulitis. 4.  Aortic Atherosclerosis (ICD10-I70.0).   Electronically Signed By: Darliss Cheney M.D. On: 01/23/2022 22:01   Assessment & Plan:    1. Kidney stones Followup 1 year with renal US -dietary handout given - Urinalysis, Routine w reflex microscopic   No follow-ups on file.  Wilkie Aye, MD  [facility]

## 2022-12-10 NOTE — Telephone Encounter (Signed)
Open in error

## 2022-12-10 NOTE — Patient Instructions (Signed)

## 2022-12-11 ENCOUNTER — Ambulatory Visit: Payer: Medicare HMO | Admitting: Urology

## 2022-12-14 LAB — URINE CULTURE

## 2022-12-15 ENCOUNTER — Telehealth: Payer: Self-pay

## 2022-12-15 MED ORDER — SULFAMETHOXAZOLE-TRIMETHOPRIM 800-160 MG PO TABS: 1.0000 | ORAL_TABLET | Freq: Two times a day (BID) | ORAL | 0 refills | Status: DC

## 2022-12-15 NOTE — Telephone Encounter (Signed)
Patient called and made aware of positive urine culture and bactrim ds sent to pharmacy.

## 2023-01-10 ENCOUNTER — Telehealth: Payer: Self-pay

## 2023-01-10 NOTE — Telephone Encounter (Signed)
Return call to patient wife. Patient's states that her husband is having burning with urination. Patient was offer an appointment to see Sarah, NP patient decline and states he has to work and unable to come to appointment. Patient was made aware to call our office back if his symptoms persisted. Patient voiced understanding

## 2023-01-11 ENCOUNTER — Ambulatory Visit: Payer: Medicare HMO | Admitting: Urology

## 2023-02-22 ENCOUNTER — Ambulatory Visit (INDEPENDENT_AMBULATORY_CARE_PROVIDER_SITE_OTHER): Payer: Medicare HMO | Admitting: Family Medicine

## 2023-02-22 ENCOUNTER — Encounter: Payer: Self-pay | Admitting: Family Medicine

## 2023-02-22 VITALS — BP 122/78 | HR 65 | Wt 205.2 lb

## 2023-02-22 DIAGNOSIS — Z79899 Other long term (current) drug therapy: Secondary | ICD-10-CM

## 2023-02-22 DIAGNOSIS — N401 Enlarged prostate with lower urinary tract symptoms: Secondary | ICD-10-CM | POA: Diagnosis not present

## 2023-02-22 DIAGNOSIS — E782 Mixed hyperlipidemia: Secondary | ICD-10-CM

## 2023-02-22 DIAGNOSIS — R351 Nocturia: Secondary | ICD-10-CM | POA: Diagnosis not present

## 2023-02-22 MED ORDER — TAMSULOSIN HCL 0.4 MG PO CAPS: ORAL_CAPSULE | ORAL | 1 refills | Status: DC

## 2023-02-22 NOTE — Progress Notes (Signed)
   Subjective:    Patient ID: Bradley [name], male    DOB: 11-03-1945, 77 y.o.   MRN: [id number]  HPI Patient arrives today for 6 month follow up. Patient would like to discuss the flomax.  Patient states that he does get up 3 times at night to urinate he only urinates a small male during the day he does a better job urinating denies hematuria denies bladder pain  Patient states he is having some numbness, cramps and heaviness  in legs.  He relates when he wakes up in the morning his lower legs feel heavy but then once he gets moving and moving about they feel okay  He does see the [place] doctor for blood work as well as his medications History of hyperlipidemia takes his medication regular basis Does take alprazolam and is been on it for years through his [place] specialist twice daily Allopurinol to prevent gout  His 3 hypertension as well  Review of Systems     Objective:   Physical Exam  General-in no acute distress Eyes-no discharge Lungs-respiratory rate normal, CTA CV-no murmurs,RRR Extremities skin warm dry no edema Neuro grossly normal Behavior normal, alert       Assessment & Plan:  1. Benign prostatic hyperplasia with nocturia I believe he is having incomplete emptying of the bladder at nighttime but he still gets a halfway decent flow during the day so therefore maintain Flomax if it ever gets to the point of having a minimal flow he would need urology referral  2. HYPERLIPIDEMIA, MIXED Continue statin check lipid - Lipid panel  3. High risk medication use Due to statin check liver enzymes - ALT  Other blood work reviewed from the [place] from earlier this year no additional labs needed Follow-up within 6 months sooner problems

## 2023-02-22 NOTE — Patient Instructions (Signed)
Please do labs in Oct 2024 Orders at [facility]

## 2023-03-07 ENCOUNTER — Ambulatory Visit: Payer: Medicare HMO | Admitting: Family Medicine

## 2023-05-23 ENCOUNTER — Ambulatory Visit (INDEPENDENT_AMBULATORY_CARE_PROVIDER_SITE_OTHER): Payer: Medicare HMO | Admitting: Family Medicine

## 2023-05-23 VITALS — BP 116/72 | HR 79 | Temp 97.7°F | Ht 64.0 in | Wt 206.4 lb

## 2023-05-23 DIAGNOSIS — F411 Generalized anxiety disorder: Secondary | ICD-10-CM

## 2023-05-23 DIAGNOSIS — G500 Trigeminal neuralgia: Secondary | ICD-10-CM

## 2023-05-23 MED ORDER — CARBAMAZEPINE 200 MG PO TABS: 200.0000 mg | ORAL_TABLET | Freq: Two times a day (BID) | ORAL | 2 refills | Status: AC | PRN

## 2023-05-23 NOTE — Progress Notes (Signed)
   Subjective:    Patient ID: Bradley [name], male    DOB: [birth year], 77 y.o.   MRN: [id number]  Discussed the use of AI scribe software for clinical note transcription with the patient, who gave verbal consent to proceed.  History of Present Illness   The patient, with a history of trigeminal neuralgia secondary to shingles, presents with concerns about medication availability. He has been receiving Carbamazepine from the [place], but recently learned it is no longer available. The patient has been rationing the remaining supply, reducing the dose from two to one pill daily, and reports noticing a difference in symptom control.  The Carbamazepine is used to manage intermittent, severe head pain, described as similar to a toothache, which the patient attributes to nerve damage from a previous shingles infection. The pain is not constant, but when it occurs, it can last for a month or more. The patient has found that Carbamazepine effectively manages this pain, usually noticing improvement within two to three days of starting the medication. He only takes the medication when experiencing pain and stops once the pain subsides.  The patient also reports a recent change in his anxiety medication. [facility] the patient off Xanax, replacing it with Hydroxyzine. The patient has been on Xanax for 11 years and is currently in the process of reducing the dose from one pill to three-quarters of a pill twice daily. He reports no significant issues with this transition so far.  The patient maintains an active lifestyle and does not take any other pain medications unless necessary, such as for kidney stones. He has previously tried Gabapentin for his head pain but found it ineffective compared to Carbamazepine.     Patient states gets intermittent sharp pains on the right side of his face along with deep aching consistent with trigeminal neuralgia [facility] previously treated him with Tegretol  and gabapentin but he no longer takes gabapentin and the Tegretol is no longer on their formulary patient presents here to have evaluation and prescription    Review of Systems     Objective:    Physical Exam   CHEST: Lungs clear to auscultation. CARDIOVASCULAR: Heart sounds normal.     General-in no acute distress Eyes-no discharge Lungs-respiratory rate normal, CTA CV-no murmurs,RRR Extremities skin warm dry no edema Neuro grossly normal Behavior normal, alert       Assessment & Plan:  Assessment and Plan    Trigeminal Neuralgia Intermittent severe facial pain, managed with Carbamazepine 200mg  twice daily as needed. The patient reports effective pain control with this regimen. No side effects reported. -Continue Carbamazepine 200mg  twice daily as needed. -Order a 120-day supply to be picked up at [facility]. -Annual blood work to monitor hemoglobin, white blood count, and liver function.  Benzodiazepine Taper The patient is currently tapering off Alprazolam with [facility], currently taking three-quarters of a pill twice daily. The patient reports no withdrawal symptoms. -Support the [facility]'s plan to gradually taper Alprazolam over the next four months. -Consider Hydroxyzine as an alternative for anxiety management.  Follow-up appointment scheduled for February.      It is in his best interest to taper down her benzodiazepine Periodically we will do lab work because of the Tegretol Patient already has a regular follow-up visit to keep that

## 2023-07-05 ENCOUNTER — Encounter (INDEPENDENT_AMBULATORY_CARE_PROVIDER_SITE_OTHER): Payer: Self-pay | Admitting: *Deleted

## 2023-08-25 ENCOUNTER — Ambulatory Visit: Payer: Medicare HMO | Admitting: Family Medicine

## 2023-11-22 ENCOUNTER — Ambulatory Visit (HOSPITAL_COMMUNITY)
Admission: RE | Admit: 2023-11-22 | Discharge: 2023-11-22 | Disposition: A | Discharge status: Home / Self Care | Source: Ambulatory Visit | Attending: Urology | Admitting: Urology

## 2023-11-22 DIAGNOSIS — N200 Calculus of kidney: Secondary | ICD-10-CM | POA: Insufficient documentation

## 2023-11-27 ENCOUNTER — Other Ambulatory Visit: Payer: Self-pay | Admitting: Family Medicine

## 2023-11-28 ENCOUNTER — Other Ambulatory Visit: Payer: Self-pay

## 2023-11-28 MED ORDER — TAMSULOSIN HCL 0.4 MG PO CAPS: ORAL_CAPSULE | ORAL | 1 refills | Status: DC

## 2023-11-30 ENCOUNTER — Ambulatory Visit: Payer: Self-pay

## 2023-12-14 ENCOUNTER — Ambulatory Visit: Payer: Medicare HMO | Admitting: Urology

## 2023-12-14 ENCOUNTER — Encounter: Payer: Self-pay | Admitting: Urology

## 2023-12-14 VITALS — BP 133/78 | HR 56

## 2023-12-14 DIAGNOSIS — N200 Calculus of kidney: Secondary | ICD-10-CM

## 2023-12-14 LAB — URINALYSIS, ROUTINE W REFLEX MICROSCOPIC
Bilirubin, UA: NEGATIVE
Glucose, UA: NEGATIVE
Ketones, UA: NEGATIVE
Leukocytes,UA: NEGATIVE
Nitrite, UA: NEGATIVE
Protein,UA: NEGATIVE
RBC, UA: NEGATIVE
Specific Gravity, UA: 1.015 (ref 1.005–1.030)
Urobilinogen, Ur: 0.2 mg/dL (ref 0.2–1.0)
pH, UA: 6.5 (ref 5.0–7.5)

## 2023-12-14 NOTE — Progress Notes (Signed)
 12/14/2023 2:57 PM   Mena Stai [birth year] [id number]  Referring provider: Bennet Brasil, MD [address] [place],  [place] [address]  Followup nephrolithiasis   HPI: Mr Deboard is a 77yo here for followup for nephrolithiasis. No stone events since last visit. No flank pain. Renal US  5/6 shows no calculi and no hydronephrosis. He does have multiple left renal cysts. IPSS 9 QOL 1 on flomax  0.4mg  daily. He had COVID 1 month ago and continues to have issues with his breathing.    PMH: 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HCC)    Shingles    hx of, on back of neck   Sleep apnea    Noncompliant with CPAP   Stroke (HCC)    only affected left eye   Trigeminal neuralgia     Surgical History: Past Surgical History:  Procedure Laterality Date   BIOPSY  08/02/2018   Procedure: BIOPSY;  Surgeon: Suzette Espy, MD;  [facility];  Service: Endoscopy;;  gastric polyp   BIOPSY  11/05/2021   Procedure: BIOPSY;  Surgeon: Suzette Espy, MD;  [facility];  Service: Endoscopy;;   COLONOSCOPY     COLONOSCOPY  12/09/2004   WGN:FAOZHYQMVH polyp at 35 cm biopsied/normal rectum   COLONOSCOPY   05/20/2008   QIO:NGEXB midsigmoid s/p snare removal/normal rectum. Tubular adenoma   COLONOSCOPY N/A 08/02/2018   Procedure: COLONOSCOPY;  Surgeon: Suzette Espy, MD;  [facility];  Service: Endoscopy;  Laterality: N/A;  1:15pm   COLONOSCOPY WITH ESOPHAGOGASTRODUODENOSCOPY (EGD) N/A 03/22/2013   Dr. Riley Cheadle: Barrett's esophagus status post segmental biopsies with no evidence of dysplasia.  Normal colonoscopy.  Next colonoscopy in 5 years.   ESOPHAGOGASTRODUODENOSCOPY  07/01/2003   MWU:XLKGMW colored epithelium involving essentially nearly the distal onehalf of the  tubular esophagus/Patulous esophagogastric junction/ Moderate sized hiatal hernia/Otherwise normal stomach, normal D1 and D2.   ESOPHAGOGASTRODUODENOSCOPY  12/09/2004   NUU:[id number] epithelium  beginning at 33 cm consistent with previous diagnosis of Barrett's esophagus  segmentally biopsied. Hiatal hernia. Otherwise normal stomach. Normal D1 and D2   ESOPHAGOGASTRODUODENOSCOPY   05/20/2008   QVZ:DGLOVF-IEPPIRJ epithelium distal esophagus consistent with Barrett's esophagus.  No significant change from prior exam. Multiple biopsies negative for dysplasia.   ESOPHAGOGASTRODUODENOSCOPY N/A 08/02/2018   Procedure: ESOPHAGOGASTRODUODENOSCOPY (EGD);  Surgeon: Suzette Espy, MD;  [facility];  Service: Endoscopy;  Laterality: N/A;   ESOPHAGOGASTRODUODENOSCOPY (EGD) WITH PROPOFOL  N/A 11/05/2021   Procedure: ESOPHAGOGASTRODUODENOSCOPY (EGD) WITH PROPOFOL ;  Surgeon: Suzette Espy, MD;  [facility];  Service: Endoscopy;  Laterality: N/A;  8:15am   EXTRACORPOREAL SHOCK WAVE LITHOTRIPSY Left 10/05/2022   Procedure: EXTRACORPOREAL SHOCK WAVE LITHOTRIPSY (ESWL);  Surgeon: Marco Severs, MD;  [facility]: [facility];  Service: Urology;  Laterality: Left;   POLYPECTOMY  08/02/2018   Procedure: POLYPECTOMY;  Surgeon: Suzette Espy, MD;  [facility];  Service: Endoscopy;;  distal transverse colon(CSx1), rectal(CSx1)   TONSILLECTOMY      Home Medications:  Allergies as of 12/14/2023   No Known Allergies      Medication List        Accurate as of Dec 14, 2023  2:57 PM. If you have any questions,  ask your nurse or doctor.          STOP taking these medications    benzonatate  100 MG capsule Commonly known as: TESSALON    sulfamethoxazole -trimethoprim  800-160 MG tablet Commonly known as: BACTRIM  DS       TAKE these medications    allopurinol 100 MG tablet Commonly known as: ZYLOPRIM Take 100 mg by mouth daily after breakfast.   ALPRAZolam 1 MG  tablet Commonly known as: XANAX Take 1 mg by mouth 2 (two) times daily.   aspirin  EC 81 MG tablet Take 81 mg by mouth at bedtime.   atorvastatin 40 MG tablet Commonly known as: LIPITOR Take 40 mg by mouth every evening.   carbamazepine  200 MG tablet Commonly known as: TEGRETOL  Take 1 tablet (200 mg total) by mouth 2 (two) times daily as needed (neuralgia).   cyanocobalamin 1000 MCG/ML injection Commonly known as: VITAMIN B12 Inject 1,000 mcg into the muscle every 30 (thirty) days.   cyclobenzaprine  5 MG tablet Commonly known as: FLEXERIL  1 qhs prn back pain caution drowsiness   diphenhydrAMINE  25 MG tablet Commonly known as: BENADRYL  Take 25 mg by mouth every 6 (six) hours as needed for allergies or sleep.   furosemide 20 MG tablet Commonly known as: LASIX Take 20 mg by mouth daily as needed for fluid or edema.   lisinopril  40 MG tablet Commonly known as: ZESTRIL  Take 40 mg by mouth daily as needed (high blood pressure).   naproxen sodium 275 MG tablet Commonly known as: ANAPROX Take 275 mg by mouth 2 (two) times daily as needed for moderate pain.   omeprazole 20 MG capsule Commonly known as: PRILOSEC Take 20 mg by mouth 2 (two) times daily before a meal.   ondansetron  4 MG disintegrating tablet Commonly known as: ZOFRAN -ODT Take 1 tablet (4 mg total) by mouth every 8 (eight) hours as needed for nausea.   oxyCODONE -acetaminophen  5-325 MG tablet Commonly known as: Percocet Take 1 tablet by mouth every 4 (four) hours as needed.   phenazopyridine  100 MG tablet Commonly known as: Pyridium  Take one po TID prn bladder spasms   pyridostigmine  60 MG tablet Commonly known as: MESTINON  Take 1 tablet (60 mg total) by mouth 2 (two) times daily. What changed: when to take this   Sodium Fluoride 5000 PPM 1.1 % Pste Generic drug: Sodium Fluoride Place 1 application. onto teeth at bedtime.   tamsulosin  0.4 MG Caps capsule Commonly known as: FLOMAX  2 qhs for BPH    Vitamin D  (Ergocalciferol ) 1.25 MG (50000 UNIT) Caps capsule Commonly known as: DRISDOL  Take 1 capsule (50,000 Units total) by mouth every 7 (seven) days.        Allergies: No Known Allergies  Family History: Family History  Problem Relation Age of Onset   Heart attack Father 71       First MI in 58s   Cancer Brother        prostate   Colon cancer Mother        age greater than 61s   Cancer Sister        brain   COPD Brother     Social History:  reports that he has never smoked. He has never used smokeless tobacco. He reports that he does not drink alcohol and does not use drugs.  ROS: All other review of systems were reviewed and are negative except what is noted above in HPI  Physical Exam: BP 133/78   Pulse (!) 56   Constitutional:  Alert and oriented, No acute distress. HEENT:  AT, moist mucus membranes.  Trachea midline, no masses. Cardiovascular: No clubbing, cyanosis, or edema. Respiratory: Normal respiratory effort, no increased work of breathing. GI: Abdomen is soft, nontender, nondistended, no abdominal masses GU: No CVA tenderness.  Lymph: No cervical or inguinal lymphadenopathy. Skin: No rashes, bruises or suspicious lesions. Neurologic: Grossly intact, no focal deficits, moving all 4 extremities. Psychiatric: Normal mood and affect.  Laboratory Data: Lab Results  Component Value Date   WBC 10.8 (H) 09/13/2022   HGB 15.8 09/13/2022   HCT 46.7 09/13/2022   MCV 91.7 09/13/2022   PLT 228 09/13/2022    Lab Results  Component Value Date   CREATININE 1.27 (H) 09/13/2022    No results found for: "PSA"  No results found for: "TESTOSTERONE"  Lab Results  Component Value Date   HGBA1C 5.6 07/23/2016    Urinalysis    Component Value Date/Time   COLORURINE STRAW (A) 09/13/2022 2205   APPEARANCEUR Clear 12/10/2022 1124   LABSPEC 1.036 (H) 09/13/2022 2205   PHURINE 7.0 09/13/2022 2205   GLUCOSEU Negative 12/10/2022 1124   HGBUR LARGE (A)  09/13/2022 2205   BILIRUBINUR Negative 12/10/2022 1124   KETONESUR 5 (A) 09/13/2022 2205   PROTEINUR Negative 12/10/2022 1124   PROTEINUR NEGATIVE 09/13/2022 2205   UROBILINOGEN negative (A) 11/16/2021 1324   UROBILINOGEN 0.2 08/07/2012 1021   NITRITE Positive (A) 12/10/2022 1124   NITRITE NEGATIVE 09/13/2022 2205   LEUKOCYTESUR 1+ (A) 12/10/2022 1124   LEUKOCYTESUR NEGATIVE 09/13/2022 2205    Lab Results  Component Value Date   LABMICR See below: 12/10/2022   WBCUA 11-30 (A) 12/10/2022   LABEPIT 0-10 12/10/2022   MUCUS Present 01/29/2022   BACTERIA Many (A) 12/10/2022    Pertinent Imaging: Renal US  11/22/23: Images reviewed and discussed with the patient  Results for orders placed during the hospital encounter of 10/05/22  DG Abd 1 View  Narrative CLINICAL DATA:  Kidney stones  EXAM: ABDOMEN - 1 VIEW  COMPARISON:  CT 09/13/2022  FINDINGS: 4 mm stone remains visible in a location consistent with the left UVJ. Cannot state with certainty that this is not passed into the bladder, but this is presumed to be at the UVJ. Small phleboliths otherwise within the pelvis as seen previously. No other sign of urinary tract stone disease. Bowel gas pattern is normal. Ordinary curvature and degenerative change of the spine.  IMPRESSION: 4 mm stone remains visible in a location consistent with the left UVJ. Cannot state that this is not passed into the bladder, but this is presumed to be at the UVJ.   Electronically Signed By: Bettylou Brunner M.D. On: 10/05/2022 09:58  No results found for this or any previous visit.  No results found for this or any previous visit.  No results found for this or any previous visit.  Results for orders placed during the hospital encounter of 11/22/23  Ultrasound renal complete  Narrative CLINICAL DATA:  Nephrolithiasis.  EXAM: RENAL / URINARY TRACT ULTRASOUND COMPLETE  COMPARISON:  Dec 03, 2022  FINDINGS: Right Kidney:  Renal  measurements: 11.5 x 6.1 x 5.1 cm = volume: 188.8 mL. Echogenicity within normal limits. No mass or hydronephrosis visualized.  Left Kidney:  Renal measurements: 13.5 x 4.8 x 5.6 cm = volume: 189.2 mL. Echogenicity within normal limits. No hydronephrosis visualized. Simple cysts are identified in the left kidney, largest measures 2.3 x 2.1 x 2.8 cm. No follow-up is recommended.  Bladder:  Appears normal for degree of bladder distention.  Other:  None.  IMPRESSION: No acute abnormality identified. No hydronephrosis bilaterally.   Electronically Signed By: Anna Barnes M.D. On: 11/23/2023 15:47  No results found for this or any previous visit.  No results found for this or any previous visit.  Results for orders placed during the hospital encounter of 01/23/22  CT RENAL STONE STUDY  Narrative CLINICAL DATA:  Lower back pain and flank pain.  EXAM: CT ABDOMEN AND PELVIS WITHOUT CONTRAST  TECHNIQUE: Multidetector CT imaging of the abdomen and pelvis was performed following the standard protocol without IV contrast.  RADIATION DOSE REDUCTION: This exam was performed according to the departmental dose-optimization program which includes automated exposure control, adjustment of the mA and/or kV according to patient size and/or use of iterative reconstruction technique.  COMPARISON:  CT abdomen and pelvis 03/12/2021  FINDINGS: Lower chest: No acute abnormality.  Hepatobiliary: No focal liver abnormality is seen. No gallstones, gallbladder wall thickening, or biliary dilatation.  Pancreas: Unremarkable. No pancreatic ductal dilatation or surrounding inflammatory changes.  Spleen: Normal in size without focal abnormality.  Adrenals/Urinary Tract: There is a 4 mm calculus in the distal left ureter image 2/74. There is no hydronephrosis. No additional urinary tract calculi are seen. Bladder and adrenal glands are within normal limits. There is a cyst in the  inferior pole the left kidney measuring 3.5 cm.  Stomach/Bowel: Stomach is within normal limits. Appendix appears normal. No evidence of bowel wall thickening, distention, or inflammatory changes. There is sigmoid colon diverticulosis.  Vascular/Lymphatic: There are atherosclerotic calcifications of the aorta. Mild stranding surrounding the abdominal aorta, decreased from prior. There are atherosclerotic calcifications of the aorta. No discrete enlarged lymph nodes are identified.  Reproductive: Enlarged, unchanged.  Other: No ascites.  Small fat containing umbilical hernias.  Musculoskeletal: Multilevel degenerative changes affect the spine  IMPRESSION: 1. 4 mm calculus in the distal left ureter.  No hydronephrosis. 2. Stranding surrounding the aorta has decreased. Findings may represent vasculitis or retroperitoneal fibrosis. No enlarged lymph nodes. 3. Colonic diverticulosis without evidence for diverticulitis. 4.  Aortic Atherosclerosis (ICD10-I70.0).   Electronically Signed By: Tyron Gallon M.D. On: 01/23/2022 22:01   Assessment & Plan:    1. Kidney stones (Primary) -followup 1 year with renal US  - Urinalysis, Routine w reflex microscopic   No follow-ups on file.  Johnie Nailer, MD  [facility] [place]

## 2023-12-14 NOTE — Patient Instructions (Signed)

## 2024-03-06 ENCOUNTER — Ambulatory Visit: Admitting: Internal Medicine

## 2024-03-06 ENCOUNTER — Encounter: Payer: Self-pay | Admitting: Internal Medicine

## 2024-03-06 VITALS — BP 146/96 | HR 59 | Temp 97.5°F | Ht 64.0 in | Wt 201.8 lb

## 2024-03-06 DIAGNOSIS — K5909 Other constipation: Secondary | ICD-10-CM

## 2024-03-06 DIAGNOSIS — K219 Gastro-esophageal reflux disease without esophagitis: Secondary | ICD-10-CM | POA: Diagnosis not present

## 2024-03-06 DIAGNOSIS — Z860101 Personal history of adenomatous and serrated colon polyps: Secondary | ICD-10-CM | POA: Diagnosis not present

## 2024-03-06 DIAGNOSIS — K2270 Barrett's esophagus without dysplasia: Secondary | ICD-10-CM | POA: Diagnosis not present

## 2024-03-06 DIAGNOSIS — Z860100 Personal history of colon polyps, unspecified: Secondary | ICD-10-CM

## 2024-03-06 NOTE — Patient Instructions (Signed)
 It was nice to see you again today!  As discussed continue taking omeprazole 20 mg twice daily-best taken 30 minutes before breakfast and supper  Try taking MiraLAX 1 capful or 17 g in 8 ounces of water  at bedtime to facilitate better bowel function  Will plan to see you back in 1 year to set up both an EGD and a colonoscopy at the same time.  If you have any problems between now and then, please do not hesitate to call me.

## 2024-03-06 NOTE — Progress Notes (Unsigned)
 Primary Care Physician:  Shona Norleen PEDLAR, MD Primary Gastroenterologist:  Dr. Shaaron  Pre-Procedure History & Physical: HPI:  Bradley [name] is a 78 y.o. male here for follow-up of GERD/Barrett's esophagus and a history of colonic adenoma.  Had 3 subcentimeter adenomas removed his colon 2020.  History of nondysplastic Barrett's last surveillance colonoscopy biopsy 2023.  10 cm segment.  Patient has done well he has occasional constipation does not take anything 40 has not had any rectal bleeding.  Has no GERD.  He denies dysphagia.  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HCC)    Shingles    hx of, on back of neck   Sleep apnea    Noncompliant with CPAP   Stroke (HCC)    only affected left eye   Trigeminal neuralgia     Past Surgical History:  Procedure Laterality Date   BIOPSY  08/02/2018   Procedure: BIOPSY;  Surgeon: Shaaron Lamar HERO, MD;  [facility];  Service: Endoscopy;;  gastric polyp   BIOPSY  11/05/2021   Procedure: BIOPSY;  Surgeon: Shaaron Lamar HERO, MD;  [facility];  Service: Endoscopy;;   COLONOSCOPY     COLONOSCOPY  12/09/2004   MFM:Ipfpwlupcz polyp at 35 cm biopsied/normal rectum   COLONOSCOPY   05/20/2008   MFM:Enobe midsigmoid s/p snare removal/normal rectum. Tubular adenoma   COLONOSCOPY N/A 08/02/2018   Procedure: COLONOSCOPY;  Surgeon: Shaaron Lamar HERO, MD;  [facility];  Service: Endoscopy;  Laterality: N/A;  1:15pm   COLONOSCOPY WITH ESOPHAGOGASTRODUODENOSCOPY (EGD) N/A 03/22/2013   Dr. Shaaron: Barrett's esophagus status post segmental biopsies with no evidence of dysplasia.  Normal colonoscopy.  Next colonoscopy in 5 years.   ESOPHAGOGASTRODUODENOSCOPY  07/01/2003   MFM:Djofnw colored epithelium involving essentially nearly the distal onehalf of  the tubular esophagus/Patulous esophagogastric junction/ Moderate sized hiatal hernia/Otherwise normal stomach, normal D1 and D2.   ESOPHAGOGASTRODUODENOSCOPY  12/09/2004   MFM:Djofnw-rnonmzi epithelium  beginning at 33 cm consistent with previous diagnosis of Barrett's esophagus  segmentally biopsied. Hiatal hernia. Otherwise normal stomach. Normal D1 and D2   ESOPHAGOGASTRODUODENOSCOPY   05/20/2008   MFM:djofnw-rnonmzi epithelium distal esophagus consistent with Barrett's esophagus.  No significant change from prior exam. Multiple biopsies negative for dysplasia.   ESOPHAGOGASTRODUODENOSCOPY N/A 08/02/2018   Procedure: ESOPHAGOGASTRODUODENOSCOPY (EGD);  Surgeon: Shaaron Lamar HERO, MD;  [facility];  Service: Endoscopy;  Laterality: N/A;   ESOPHAGOGASTRODUODENOSCOPY (EGD) WITH PROPOFOL  N/A 11/05/2021   Procedure: ESOPHAGOGASTRODUODENOSCOPY (EGD) WITH PROPOFOL ;  Surgeon: Shaaron Lamar HERO, MD;  [facility];  Service: Endoscopy;  Laterality: N/A;  8:15am   EXTRACORPOREAL SHOCK WAVE LITHOTRIPSY Left 10/05/2022   Procedure: EXTRACORPOREAL SHOCK WAVE LITHOTRIPSY (ESWL);  Surgeon: Sherrilee Belvie CROME, MD;  Location: [facility];  Service: Urology;  Laterality: Left;   POLYPECTOMY  08/02/2018   Procedure: POLYPECTOMY;  Surgeon: Shaaron Lamar HERO, MD;  [facility];  Service: Endoscopy;;  distal transverse colon(CSx1), rectal(CSx1)   TONSILLECTOMY      Prior to Admission medications   Medication Sig Start Date End Date Taking? Authorizing Provider  allopurinol (ZYLOPRIM) 100 MG tablet Take 100 mg by mouth daily after breakfast.    Yes [provider]  ALPRAZolam (XANAX) 1 MG tablet  Take 1 mg by mouth 2 (two) times daily.   Yes [provider]  aspirin  EC 81 MG tablet Take 81 mg by mouth at bedtime.    Yes [provider]  atorvastatin (LIPITOR) 40 MG tablet Take 40 mg by mouth every evening.    Yes [provider]  carbamazepine  (TEGRETOL ) 200 MG  tablet Take 1 tablet (200 mg total) by mouth 2 (two) times daily as needed (neuralgia). 05/23/23  Yes Luking, Glendia LABOR, MD  cyanocobalamin (,VITAMIN B-12,) 1000 MCG/ML injection Inject 1,000 mcg into the muscle every 30 (thirty) days.   Yes [provider]  cyclobenzaprine  (FLEXERIL ) 5 MG tablet 1 qhs prn back pain caution drowsiness 06/28/22  Yes Luking, Glendia LABOR, MD  diphenhydrAMINE  (BENADRYL ) 25 MG tablet Take 25 mg by mouth every 6 (six) hours as needed for allergies or sleep.    Yes [provider]  furosemide (LASIX) 20 MG tablet Take 20 mg by mouth daily as needed for fluid or edema.   Yes [provider]  lisinopril  (ZESTRIL ) 40 MG tablet Take 40 mg by mouth daily as needed (high blood pressure).   Yes [provider]  naproxen sodium (ANAPROX) 275 MG tablet Take 275 mg by mouth 2 (two) times daily as needed for moderate pain.   Yes [provider]  omeprazole (PRILOSEC) 20 MG capsule Take 20 mg by mouth 2 (two) times daily before a meal.   Yes [provider]  ondansetron  (ZOFRAN -ODT) 4 MG disintegrating tablet Take 1 tablet (4 mg total) by mouth every 8 (eight) hours as needed for nausea. 10/05/22  Yes McKenzie, Belvie CROME, MD  oxyCODONE -acetaminophen  (PERCOCET) 5-325 MG tablet Take 1 tablet by mouth every 4 (four) hours as needed. 10/05/22  Yes McKenzie, Belvie CROME, MD  pyridostigmine  (MESTINON ) 60 MG tablet Take 1 tablet (60 mg total) by mouth 2 (two) times daily. 07/24/19  Yes Penumalli, Vikram R, MD  SODIUM FLUORIDE 5000 PPM 1.1 % PSTE Place 1 application. onto teeth at bedtime. 09/22/20  Yes [provider]  tamsulosin  (FLOMAX ) 0.4 MG CAPS capsule 2 qhs for BPH 11/28/23  Yes Luking, Scott A, MD  Vitamin D , Ergocalciferol , (DRISDOL ) 1.25 MG (50000 UNIT) CAPS capsule Take 1 capsule (50,000 Units total) by mouth every 7 (seven) days. 03/18/22  Yes Alphonsa Glendia LABOR, MD    Allergies as of 03/06/2024   (No Known Allergies)    Family  History  Problem Relation Age of Onset   Heart attack Father 52       First MI in 54s   Cancer Brother        prostate   Colon cancer Mother        age greater than 57s   Cancer Sister        brain   COPD Brother     Social History   Socioeconomic History   Marital status: Married    Spouse name: Lobbyist   Number of children: 2   Years of education: 14   Highest education level: Not on file  Occupational History   Occupation: Retired    Comment: Chief Executive Officer  Tobacco Use   Smoking status: Never   Smokeless tobacco: Never  Vaping Use   Vaping status: Never Used  Substance and Sexual Activity   Alcohol use: No   Drug use: No   Sexual activity: Yes  Other Topics Concern   Not on file  Social History Narrative   Lives with  wife   2 children, 1 passed away   caffeine- coffee 1-2 daily   Social Drivers of Corporate investment banker Strain: Low Risk  (08/11/2021)   Overall Financial Resource Strain (CARDIA)    Difficulty of Paying Living Expenses: Not very hard  Food Insecurity: No Food Insecurity (08/11/2021)   Hunger Vital Sign    Worried About Running Out of Food in the Last Year: Never true    Ran Out of Food in the Last Year: Never true  Transportation Needs: No Transportation Needs (08/11/2021)   PRAPARE - Administrator, Civil Service (Medical): No    Lack of Transportation (Non-Medical): No  Physical Activity: Insufficiently Active (08/11/2021)   Exercise Vital Sign    Days of Exercise per Week: 5 days    Minutes of Exercise per Session: 20 min  Stress: No Stress Concern Present (08/11/2021)   [employer] of Occupational Health - Occupational Stress Questionnaire    Feeling of Stress : Only a little  Social Connections: Socially Integrated (08/11/2021)   Social Connection and Isolation Panel    Frequency of Communication with Friends and Family: More than three times a week    Frequency of Social Gatherings with Friends and Family:  More than three times a week    Attends Religious Services: More than 4 times per year    Active Member of [employer] or Organizations: Yes    Attends Engineer, structural: More than 4 times per year    Marital Status: Married  Catering manager Violence: Not At Risk (08/11/2021)   Humiliation, Afraid, Rape, and Kick questionnaire    Fear of Current or Ex-Partner: No    Emotionally Abused: No    Physically Abused: No    Sexually Abused: No    Review of Systems: See HPI, otherwise negative ROS  Physical Exam: BP (!) 146/96 (BP Location: Left Arm, Patient Position: Sitting, Cuff Size: Large)   Pulse (!) 59   Temp (!) 97.5 F (36.4 C) (Oral)   Ht 5' 4 (1.626 m)   Wt 201 lb 12.8 oz (91.5 kg)   SpO2 96%   BMI 34.64 kg/m  General:   Alert,  Well-developed, well-nourished, pleasant and cooperative in NAD Neck:  Supple; no masses or thyromegaly. No significant cervical adenopathy. Lungs:  Clear throughout to auscultation.   No wheezes, crackles, or rhonchi. No acute distress. Heart:  Regular rate and rhythm; no murmurs, clicks, rubs,  or gallops. Abdomen: Non-distended, normal bowel sounds.  Soft and nontender without appreciable mass or hepatosplenomegaly.   Impression: 78 year old gentleman with GERD's complicated by nondysplastic long segment Barrett's doing well from an upper GI tract standpoint.  History of colonic adenomas.  Chronic constipation.  Recommendations:   As discussed continue taking omeprazole 20 mg twice daily-best taken 30 minutes before breakfast and supper  Try taking MiraLAX 1 capful or 17 g in 8 ounces of water  at bedtime to facilitate better bowel function  Will plan to see you back in 1 year to set up both an EGD and a colonoscopy at the same time.  He is desirous of a small volume prep and colonoscopy.  I suspect that alone will not be adequate any providing a good preparation.  Will plan to prime  His colon with either MiraLAX or Linzess the week  prior to his scheduled colonoscopy date.  Notice: This dictation was prepared with Dragon dictation along with smaller phrase technology. Any transcriptional errors that result from this  process are unintentional and may not be corrected upon review.

## 2024-04-26 ENCOUNTER — Other Ambulatory Visit (HOSPITAL_COMMUNITY): Payer: Self-pay | Admitting: Radiology

## 2024-04-26 DIAGNOSIS — R0609 Other forms of dyspnea: Secondary | ICD-10-CM

## 2024-05-21 ENCOUNTER — Other Ambulatory Visit: Payer: Self-pay | Admitting: Family Medicine

## 2024-07-30 ENCOUNTER — Other Ambulatory Visit: Payer: Self-pay

## 2024-07-30 ENCOUNTER — Encounter: Payer: Self-pay | Admitting: Internal Medicine

## 2024-07-30 ENCOUNTER — Ambulatory Visit (HOSPITAL_COMMUNITY)
Admission: RE | Admit: 2024-07-30 | Discharge: 2024-07-30 | Disposition: A | Discharge status: Home / Self Care | Source: Ambulatory Visit | Attending: Internal Medicine | Admitting: Internal Medicine

## 2024-07-30 ENCOUNTER — Ambulatory Visit: Admitting: Internal Medicine

## 2024-07-30 VITALS — BP 121/71 | HR 54 | Ht 64.0 in | Wt 200.0 lb

## 2024-07-30 DIAGNOSIS — R0609 Other forms of dyspnea: Secondary | ICD-10-CM

## 2024-07-30 MED ORDER — FAMOTIDINE 20 MG PO TABS: ORAL_TABLET | ORAL | 11 refills | Status: AC

## 2024-07-30 MED ORDER — PANTOPRAZOLE SODIUM 40 MG PO TBEC: 40.0000 mg | DELAYED_RELEASE_TABLET | Freq: Every day | ORAL | 2 refills | Status: AC

## 2024-07-30 NOTE — Progress Notes (Unsigned)
 "   Bradley [name], male    DOB: 02/17/1946    MRN: [id number]   Brief patient profile:  18  yowm  never smoker  referred to pulmonary clinic in [place]  07/30/2024 by Rosaline Macadam for doe  and problems with voice   Pt not previously seen by PCCM service.     History of Present Illness  07/30/2024  Pulmonary/ 1st office eval/ [facility] / [facility]  Chief Complaint  Patient presents with   Establish Care    Doe /  wheezing /  Was sick a couple months ago unsure of what it was but (covid flu) symptoms started then.   Dyspnea:  singing, bending over  Cough: mostly resolved  Sleep: bed is flat sleeping on side one pillow  SABA use: not  inhalation  02: none    No obvious day to day or daytime pattern/variability or assoc excess/ purulent sputum or mucus plugs or hemoptysis or cp or chest tightness, subjective wheeze or overt sinus or hb symptoms.    Also denies any obvious fluctuation of symptoms with weather or environmental changes or other aggravating or alleviating factors except as outlined above   No unusual exposure hx or h/o childhood pna/ asthma or knowledge of premature birth.  Current Allergies, Complete Past Medical History, Past Surgical History, Family History, and Social History were reviewed in [employer] record.  ROS  The following are not active complaints unless bolded Hoarseness, sore throat, dysphagia, dental problems, itching, sneezing,  nasal congestion or discharge of excess mucus or purulent secretions, ear ache,   fever, chills, sweats, unintended wt loss or wt gain, classically pleuritic or exertional cp,  orthopnea pnd or arm/hand swelling  or leg swelling, presyncope, palpitations, abdominal pain, anorexia, nausea, vomiting, diarrhea  or change in bowel habits or change in bladder habits, change in stools or change in urine, dysuria, hematuria,  rash, arthralgias, visual complaints, headache, numbness, weakness or ataxia or  problems with walking or coordination,  change in mood or  memory.            Outpatient Medications Prior to Visit  Medication Sig Dispense Refill   AIRSUPRA 90-80 MCG/ACT AERO Inhale into the lungs.     albuterol  (VENTOLIN  HFA) 108 (90 Base) MCG/ACT inhaler SMARTSIG:2 Puff(s) By Mouth Every 4 Hours     allopurinol (ZYLOPRIM) 100 MG tablet Take 100 mg by mouth daily after breakfast.      ALPRAZolam (XANAX) 1 MG tablet Take 1 mg by mouth 2 (two) times daily.     aspirin  EC 81 MG tablet Take 81 mg by mouth at bedtime.      atorvastatin (LIPITOR) 40 MG tablet Take 40 mg by mouth every evening.      carbamazepine  (TEGRETOL ) 200 MG tablet Take 1 tablet (200 mg total) by mouth 2 (two) times daily as needed (neuralgia). 120 tablet 2   cyanocobalamin (,VITAMIN B-12,) 1000 MCG/ML injection Inject 1,000 mcg into the muscle every 30 (thirty) days.     cyclobenzaprine  (FLEXERIL ) 5 MG tablet 1 qhs prn back pain caution drowsiness 14 tablet 0   diphenhydrAMINE  (BENADRYL ) 25 MG tablet Take 25 mg by mouth every 6 (six) hours as needed for allergies or sleep.      furosemide (LASIX) 20 MG tablet Take 20 mg by mouth daily as needed for fluid or edema.     lisinopril  (ZESTRIL ) 40 MG tablet Take 40 mg by mouth daily as needed (high blood pressure).  naproxen sodium (ANAPROX) 275 MG tablet Take 275 mg by mouth 2 (two) times daily as needed for moderate pain.     omeprazole (PRILOSEC) 20 MG capsule Take 20 mg by mouth 2 (two) times daily before a meal.     ondansetron  (ZOFRAN -ODT) 4 MG disintegrating tablet Take 1 tablet (4 mg total) by mouth every 8 (eight) hours as needed for nausea. 30 tablet 0   oxyCODONE -acetaminophen  (PERCOCET) 5-325 MG tablet Take 1 tablet by mouth every 4 (four) hours as needed. 30 tablet 0   predniSONE  (DELTASONE ) 20 MG tablet Take 20 mg by mouth.     pyridostigmine  (MESTINON ) 60 MG tablet Take 1 tablet (60 mg total) by mouth 2 (two) times daily. 270 tablet 4   SODIUM FLUORIDE 5000  PPM 1.1 % PSTE Place 1 application. onto teeth at bedtime.     tamsulosin  (FLOMAX ) 0.4 MG CAPS capsule TAKE 2 CAPSULES BY MOUTH AT BEDTIME 180 capsule 0   Vitamin D , Ergocalciferol , (DRISDOL ) 1.25 MG (50000 UNIT) CAPS capsule Take 1 capsule (50,000 Units total) by mouth every 7 (seven) days. 8 capsule 0   No facility-administered medications prior to visit.    Past Medical History:  Diagnosis Date   Barrett esophagus    History of   GERD (gastroesophageal reflux disease)    Gout    History of adenomatous polyp of colon    Hyperlipidemia    Hypertension    Ocular myasthenia    Shingles    hx of, on back of neck   Sleep apnea    Noncompliant with CPAP   Stroke (HCC)    only affected left eye   Trigeminal neuralgia       Objective:     BP 121/71   Pulse (!) 54   Ht 5' 4 (1.626 m)   Wt 200 lb (90.7 kg)   SpO2 94% Comment: ra  BMI 34.33 kg/m   SpO2: 94 % (ra) amb mod obese wm/ minimal hoarseness    HEENT : Oropharynx  clear      Nasal turbinates nl    NECK :  without  apparent JVD/ palpable Nodes/TM    LUNGS: no acc muscle use,  Nl contour chest which is clear to A and P bilaterally without cough on insp or exp maneuvers   CV:  RRR  no s3 or murmur or increase in P2, and no edema   ABD:  soft and nontender   MS:  Gait nl   ext warm without deformities Or obvious joint restrictions  calf tenderness, cyanosis or clubbing    SKIN: warm and dry without lesions    NEURO:  alert, approp, nl sensorium with  no motor or cerebellar deficits apparent.    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

## 2024-07-30 NOTE — Patient Instructions (Addendum)
 Stop  lisinopril  completely and  change prilosec   to Pantoprazole  (protonix ) 40 mg   Take  30-60 min before first meal of the day and Pepcid  (famotidine )  20 mg after supper until return to office - this is the best way to tell whether stomach acid is contributing to your problem.     GERD (REFLUX)  is an extremely common cause of respiratory symptoms just like yours , many times with no obvious heartburn at all.    It can be treated with medication, but also with lifestyle changes including elevation of the head of your bed (ideally with 6 -8inch blocks under the headboard of your bed),  Smoking cessation, avoidance of late meals, excessive alcohol, and avoid fatty foods, chocolate, peppermint, colas, red wine, and acidic juices such as orange juice.  NO MINT OR MENTHOL PRODUCTS SO NO COUGH DROPS  USE SUGARLESS CANDY INSTEAD (Jolley ranchers or Stover's or Life Savers) or even ice chips will also do - the key is to swallow to prevent all throat clearing. NO OIL BASED VITAMINS - use powdered substitutes.  Avoid fish oil when coughing.   PFTs with MIP and MEP  (may need to be done at [facility] if not offered at [facility]  Please remember to go to the  x-ray department  @  [facility] for your tests - we will call you with the results when they are available     Please schedule a follow up office visit in 4-6 weeks, sooner if needed

## 2024-07-31 ENCOUNTER — Ambulatory Visit: Payer: Self-pay | Admitting: Internal Medicine

## 2024-07-31 NOTE — Telephone Encounter (Signed)
 Pt was advised, verbalized understanding nothing further needed.

## 2024-07-31 NOTE — Assessment & Plan Note (Addendum)
 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)  - 07/30/2024   Walked on RA  x  3  lap(s) =  approx 450  ft  @ mod/fast pace, stopped due to end of study  with lowest 02 sats 93% but increased to 94% before stopping   -  07/30/2024 empirical gerd rx and ordered pfts with MIP/ MEP   Symptoms are  disproportionate to objective findings and not clear to what extent this is actually a pulmonary  problem but pt does appear to have difficult to sort out respiratory symptoms of unknown origin for which  DDX  = almost all start with A and  include Adherence, Ace Inhibitors, Acid Reflux, Active Sinus Disease, Alpha 1 Antitripsin deficiency, Anxiety masquerading as Airways dz,  ABPA,  Allergy(esp in young), Aspiration (esp in elderly), Adverse effects of meds,  Active smoking or Vaping, A bunch of PE's/clot burden (a few small clots can't cause this syndrome unless there is already severe underlying pulm or vascular dz with poor reserve),  Anemia or thyroid  disorder, plus two Bs  = Bronchiectasis and Beta blocker use..and one C= CHF  I really don't see any dx's that fit here though his h/o ACEi intolerance suggests underlying GERD and he does have multiple risk factors so try gerd rx while awaiting pfts as above and go from there   Discussed in detail all the  indications, usual  risks and alternatives  relative to the benefits with patient who agrees to proceed with Rx as outlined.      Each maintenance medication was reviewed in detail including emphasizing most importantly the difference between maintenance and prns and under what circumstances the prns are to be triggered using an action plan format where appropriate.  Total time for H and P, chart review, counseling,  directly observing portions of ambulatory 02 saturation study/ and generating customized AVS unique to this office visit / same day charting = 48 min new pt eval with   refractory respiratory  symptoms of uncertain  etiology

## 2024-08-14 ENCOUNTER — Other Ambulatory Visit: Payer: Self-pay | Admitting: Family Medicine

## 2024-09-25 ENCOUNTER — Encounter (HOSPITAL_COMMUNITY)

## 2024-10-02 ENCOUNTER — Ambulatory Visit: Admitting: Internal Medicine
